# Patient Record
Sex: Female | Born: 1985 | Race: Black or African American | Hispanic: No | Marital: Single | Smoking: Former smoker
Health system: Southern US, Community
[De-identification: ages and names within clinical notes are randomized; demographics above are authoritative.]

## PROBLEM LIST (undated history)

## (undated) ENCOUNTER — Inpatient Hospital Stay (HOSPITAL_COMMUNITY): Payer: Self-pay

## (undated) DIAGNOSIS — L309 Dermatitis, unspecified: Secondary | ICD-10-CM

## (undated) DIAGNOSIS — O039 Complete or unspecified spontaneous abortion without complication: Secondary | ICD-10-CM

## (undated) DIAGNOSIS — A549 Gonococcal infection, unspecified: Secondary | ICD-10-CM

## (undated) DIAGNOSIS — T783XXA Angioneurotic edema, initial encounter: Secondary | ICD-10-CM

## (undated) DIAGNOSIS — IMO0001 Reserved for inherently not codable concepts without codable children: Secondary | ICD-10-CM

## (undated) DIAGNOSIS — F419 Anxiety disorder, unspecified: Secondary | ICD-10-CM

## (undated) DIAGNOSIS — J189 Pneumonia, unspecified organism: Secondary | ICD-10-CM

## (undated) DIAGNOSIS — B9689 Other specified bacterial agents as the cause of diseases classified elsewhere: Secondary | ICD-10-CM

## (undated) DIAGNOSIS — A599 Trichomoniasis, unspecified: Secondary | ICD-10-CM

## (undated) DIAGNOSIS — D649 Anemia, unspecified: Secondary | ICD-10-CM

## (undated) DIAGNOSIS — R51 Headache: Secondary | ICD-10-CM

## (undated) DIAGNOSIS — N760 Acute vaginitis: Secondary | ICD-10-CM

## (undated) DIAGNOSIS — A749 Chlamydial infection, unspecified: Secondary | ICD-10-CM

## (undated) DIAGNOSIS — F319 Bipolar disorder, unspecified: Secondary | ICD-10-CM

## (undated) DIAGNOSIS — B999 Unspecified infectious disease: Secondary | ICD-10-CM

## (undated) DIAGNOSIS — R87629 Unspecified abnormal cytological findings in specimens from vagina: Secondary | ICD-10-CM

## (undated) DIAGNOSIS — R519 Headache, unspecified: Secondary | ICD-10-CM

## (undated) HISTORY — DX: Unspecified abnormal cytological findings in specimens from vagina: R87.629

## (undated) HISTORY — PX: DILATION AND CURETTAGE OF UTERUS: SHX78

## (undated) HISTORY — DX: Headache: R51

## (undated) HISTORY — DX: Angioneurotic edema, initial encounter: T78.3XXA

## (undated) HISTORY — DX: Dermatitis, unspecified: L30.9

## (undated) HISTORY — DX: Pneumonia, unspecified organism: J18.9

## (undated) HISTORY — DX: Headache, unspecified: R51.9

---
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
## 2003-01-25 ENCOUNTER — Emergency Department (HOSPITAL_COMMUNITY): Admission: EM | Admit: 2003-01-25 | Discharge: 2003-01-25 | Payer: Self-pay | Admitting: Emergency Medicine

## 2004-02-21 ENCOUNTER — Emergency Department (HOSPITAL_COMMUNITY): Admission: EM | Admit: 2004-02-21 | Discharge: 2004-02-21 | Payer: Self-pay | Admitting: Emergency Medicine

## 2004-02-29 ENCOUNTER — Emergency Department (HOSPITAL_COMMUNITY): Admission: EM | Admit: 2004-02-29 | Discharge: 2004-02-29 | Payer: Self-pay | Admitting: Emergency Medicine

## 2004-05-16 ENCOUNTER — Emergency Department (HOSPITAL_COMMUNITY): Admission: EM | Admit: 2004-05-16 | Discharge: 2004-05-16 | Payer: Self-pay | Admitting: Family Medicine

## 2004-08-15 ENCOUNTER — Emergency Department (HOSPITAL_COMMUNITY): Admission: AD | Admit: 2004-08-15 | Discharge: 2004-08-15 | Payer: Self-pay | Admitting: Family Medicine

## 2005-01-20 ENCOUNTER — Emergency Department (HOSPITAL_COMMUNITY): Admission: EM | Admit: 2005-01-20 | Discharge: 2005-01-20 | Payer: Self-pay | Admitting: Family Medicine

## 2005-05-11 ENCOUNTER — Emergency Department (HOSPITAL_COMMUNITY): Admission: EM | Admit: 2005-05-11 | Discharge: 2005-05-11 | Payer: Self-pay | Admitting: Family Medicine

## 2005-11-02 ENCOUNTER — Emergency Department (HOSPITAL_COMMUNITY): Admission: EM | Admit: 2005-11-02 | Discharge: 2005-11-02 | Payer: Self-pay | Admitting: Family Medicine

## 2005-11-02 ENCOUNTER — Inpatient Hospital Stay (HOSPITAL_COMMUNITY): Admission: AD | Admit: 2005-11-02 | Discharge: 2005-11-03 | Payer: Self-pay | Admitting: Family Medicine

## 2005-11-20 ENCOUNTER — Emergency Department (HOSPITAL_COMMUNITY): Admission: EM | Admit: 2005-11-20 | Discharge: 2005-11-20 | Payer: Self-pay | Admitting: Family Medicine

## 2005-11-27 ENCOUNTER — Emergency Department (HOSPITAL_COMMUNITY): Admission: EM | Admit: 2005-11-27 | Discharge: 2005-11-27 | Payer: Self-pay | Admitting: Family Medicine

## 2006-03-10 ENCOUNTER — Emergency Department (HOSPITAL_COMMUNITY): Admission: EM | Admit: 2006-03-10 | Discharge: 2006-03-10 | Payer: Self-pay | Admitting: Emergency Medicine

## 2006-03-21 ENCOUNTER — Emergency Department (HOSPITAL_COMMUNITY): Admission: EM | Admit: 2006-03-21 | Discharge: 2006-03-21 | Payer: Self-pay | Admitting: Emergency Medicine

## 2006-03-27 ENCOUNTER — Emergency Department (HOSPITAL_COMMUNITY): Admission: EM | Admit: 2006-03-27 | Discharge: 2006-03-27 | Payer: Self-pay | Admitting: Emergency Medicine

## 2006-04-26 ENCOUNTER — Emergency Department (HOSPITAL_COMMUNITY): Admission: EM | Admit: 2006-04-26 | Discharge: 2006-04-26 | Payer: Self-pay | Admitting: Family Medicine

## 2006-05-04 ENCOUNTER — Emergency Department (HOSPITAL_COMMUNITY): Admission: EM | Admit: 2006-05-04 | Discharge: 2006-05-05 | Payer: Self-pay | Admitting: Emergency Medicine

## 2006-05-17 ENCOUNTER — Emergency Department (HOSPITAL_COMMUNITY): Admission: EM | Admit: 2006-05-17 | Discharge: 2006-05-17 | Payer: Self-pay | Admitting: Family Medicine

## 2006-05-21 ENCOUNTER — Emergency Department (HOSPITAL_COMMUNITY): Admission: EM | Admit: 2006-05-21 | Discharge: 2006-05-21 | Payer: Self-pay | Admitting: Emergency Medicine

## 2006-07-15 ENCOUNTER — Emergency Department (HOSPITAL_COMMUNITY): Admission: EM | Admit: 2006-07-15 | Discharge: 2006-07-15 | Payer: Self-pay | Admitting: Emergency Medicine

## 2006-07-18 ENCOUNTER — Emergency Department (HOSPITAL_COMMUNITY): Admission: EM | Admit: 2006-07-18 | Discharge: 2006-07-18 | Payer: Self-pay | Admitting: Emergency Medicine

## 2006-08-12 ENCOUNTER — Encounter (INDEPENDENT_AMBULATORY_CARE_PROVIDER_SITE_OTHER): Payer: Self-pay | Admitting: *Deleted

## 2006-09-07 ENCOUNTER — Emergency Department (HOSPITAL_COMMUNITY): Admission: EM | Admit: 2006-09-07 | Discharge: 2006-09-07 | Payer: Self-pay | Admitting: Family Medicine

## 2006-11-08 ENCOUNTER — Emergency Department (HOSPITAL_COMMUNITY): Admission: EM | Admit: 2006-11-08 | Discharge: 2006-11-08 | Payer: Self-pay | Admitting: Emergency Medicine

## 2006-11-10 ENCOUNTER — Emergency Department (HOSPITAL_COMMUNITY): Admission: EM | Admit: 2006-11-10 | Discharge: 2006-11-11 | Payer: Self-pay | Admitting: Emergency Medicine

## 2006-11-24 ENCOUNTER — Ambulatory Visit: Payer: Self-pay | Admitting: Internal Medicine

## 2006-12-01 ENCOUNTER — Encounter (INDEPENDENT_AMBULATORY_CARE_PROVIDER_SITE_OTHER): Payer: Self-pay | Admitting: Family Medicine

## 2006-12-01 ENCOUNTER — Ambulatory Visit: Payer: Self-pay | Admitting: Internal Medicine

## 2007-05-19 ENCOUNTER — Emergency Department (HOSPITAL_COMMUNITY): Admission: EM | Admit: 2007-05-19 | Discharge: 2007-05-19 | Payer: Self-pay | Admitting: Family Medicine

## 2007-06-22 ENCOUNTER — Ambulatory Visit: Payer: Self-pay | Admitting: Family Medicine

## 2007-07-11 ENCOUNTER — Emergency Department (HOSPITAL_COMMUNITY): Admission: EM | Admit: 2007-07-11 | Discharge: 2007-07-11 | Payer: Self-pay | Admitting: Family Medicine

## 2007-09-18 ENCOUNTER — Ambulatory Visit: Payer: Self-pay | Admitting: Internal Medicine

## 2007-09-27 ENCOUNTER — Ambulatory Visit: Payer: Self-pay | Admitting: *Deleted

## 2007-10-30 ENCOUNTER — Emergency Department (HOSPITAL_COMMUNITY): Admission: EM | Admit: 2007-10-30 | Discharge: 2007-10-30 | Payer: Self-pay | Admitting: Emergency Medicine

## 2007-12-18 ENCOUNTER — Emergency Department (HOSPITAL_COMMUNITY): Admission: EM | Admit: 2007-12-18 | Discharge: 2007-12-18 | Payer: Self-pay | Admitting: Emergency Medicine

## 2007-12-22 ENCOUNTER — Encounter: Payer: Self-pay | Admitting: Family Medicine

## 2007-12-22 ENCOUNTER — Ambulatory Visit: Payer: Self-pay | Admitting: Family Medicine

## 2007-12-22 LAB — CONVERTED CEMR LAB
Chlamydia, DNA Probe: NEGATIVE
GC Probe Amp, Genital: NEGATIVE

## 2008-05-01 IMAGING — CR DG CHEST 2V
2 series · 2 of 2 positions shown · non-contrast
Comparison: None.

CLINICAL DATA: Mid chest pain. Smoker.

CHEST - 2 VIEW  11/08/2006:

[w chest pa]
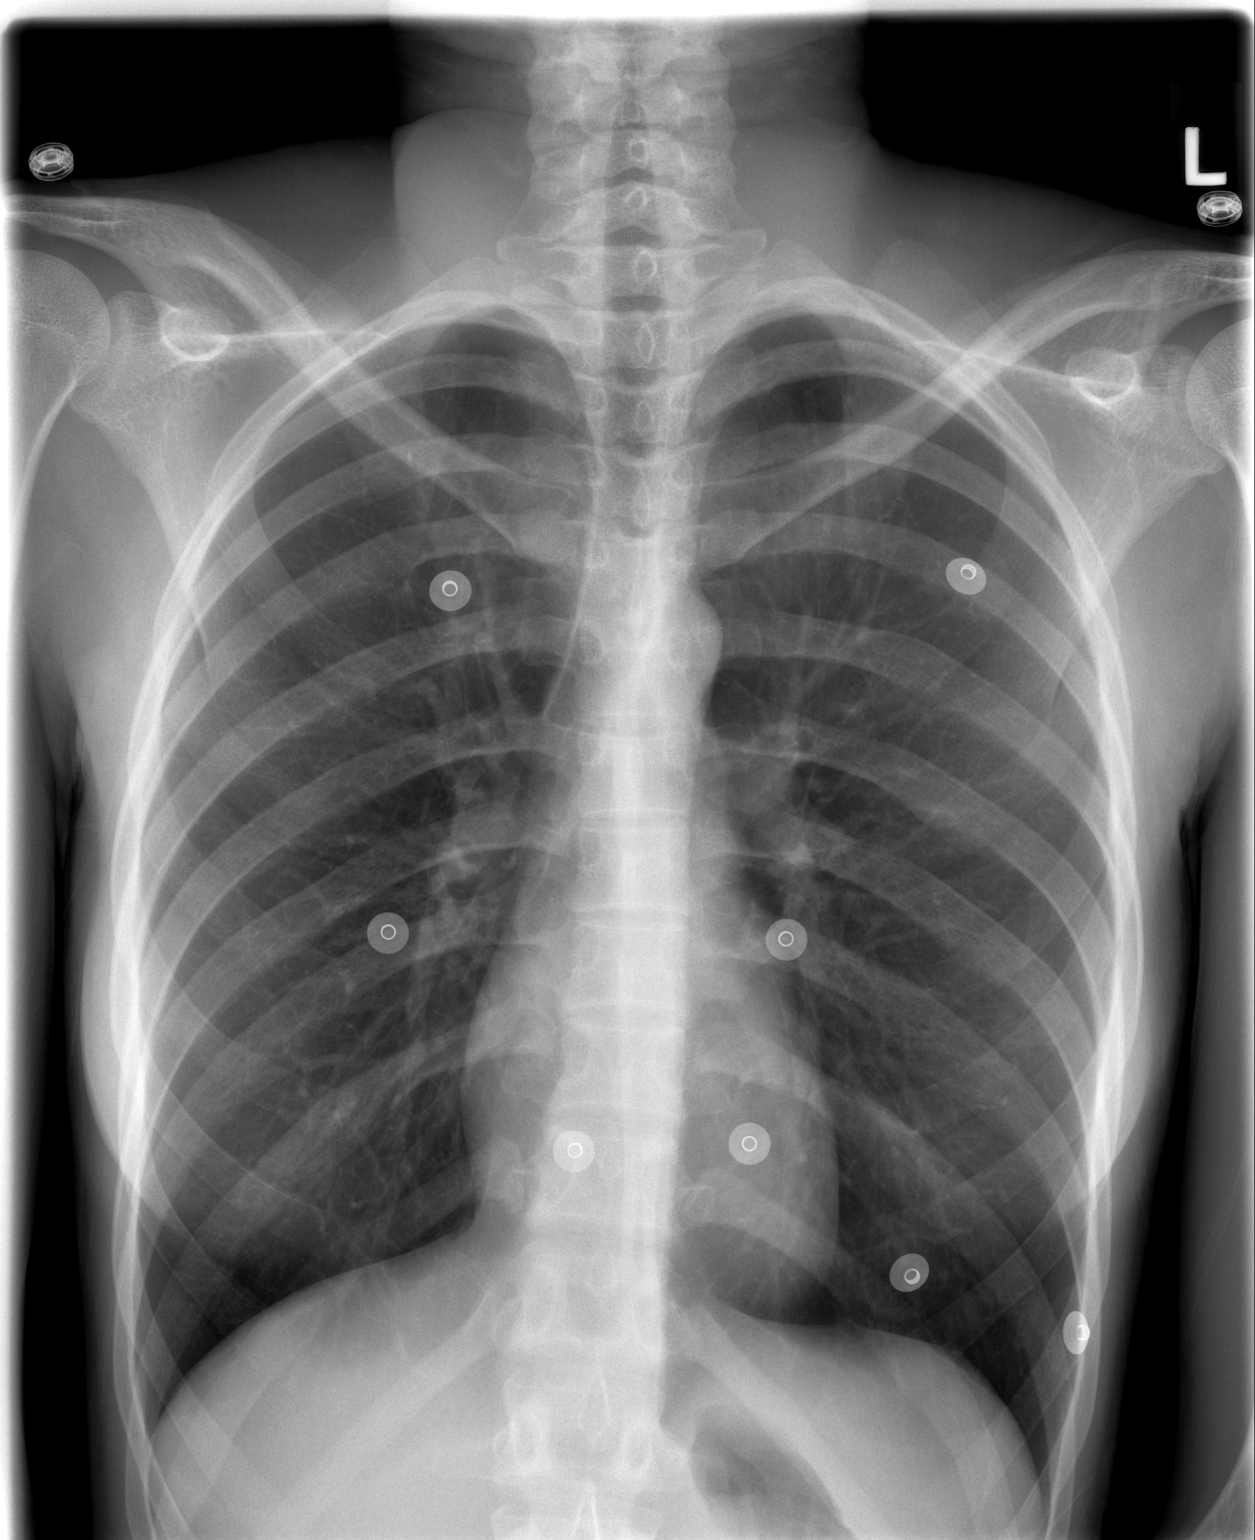

[w chest lat]
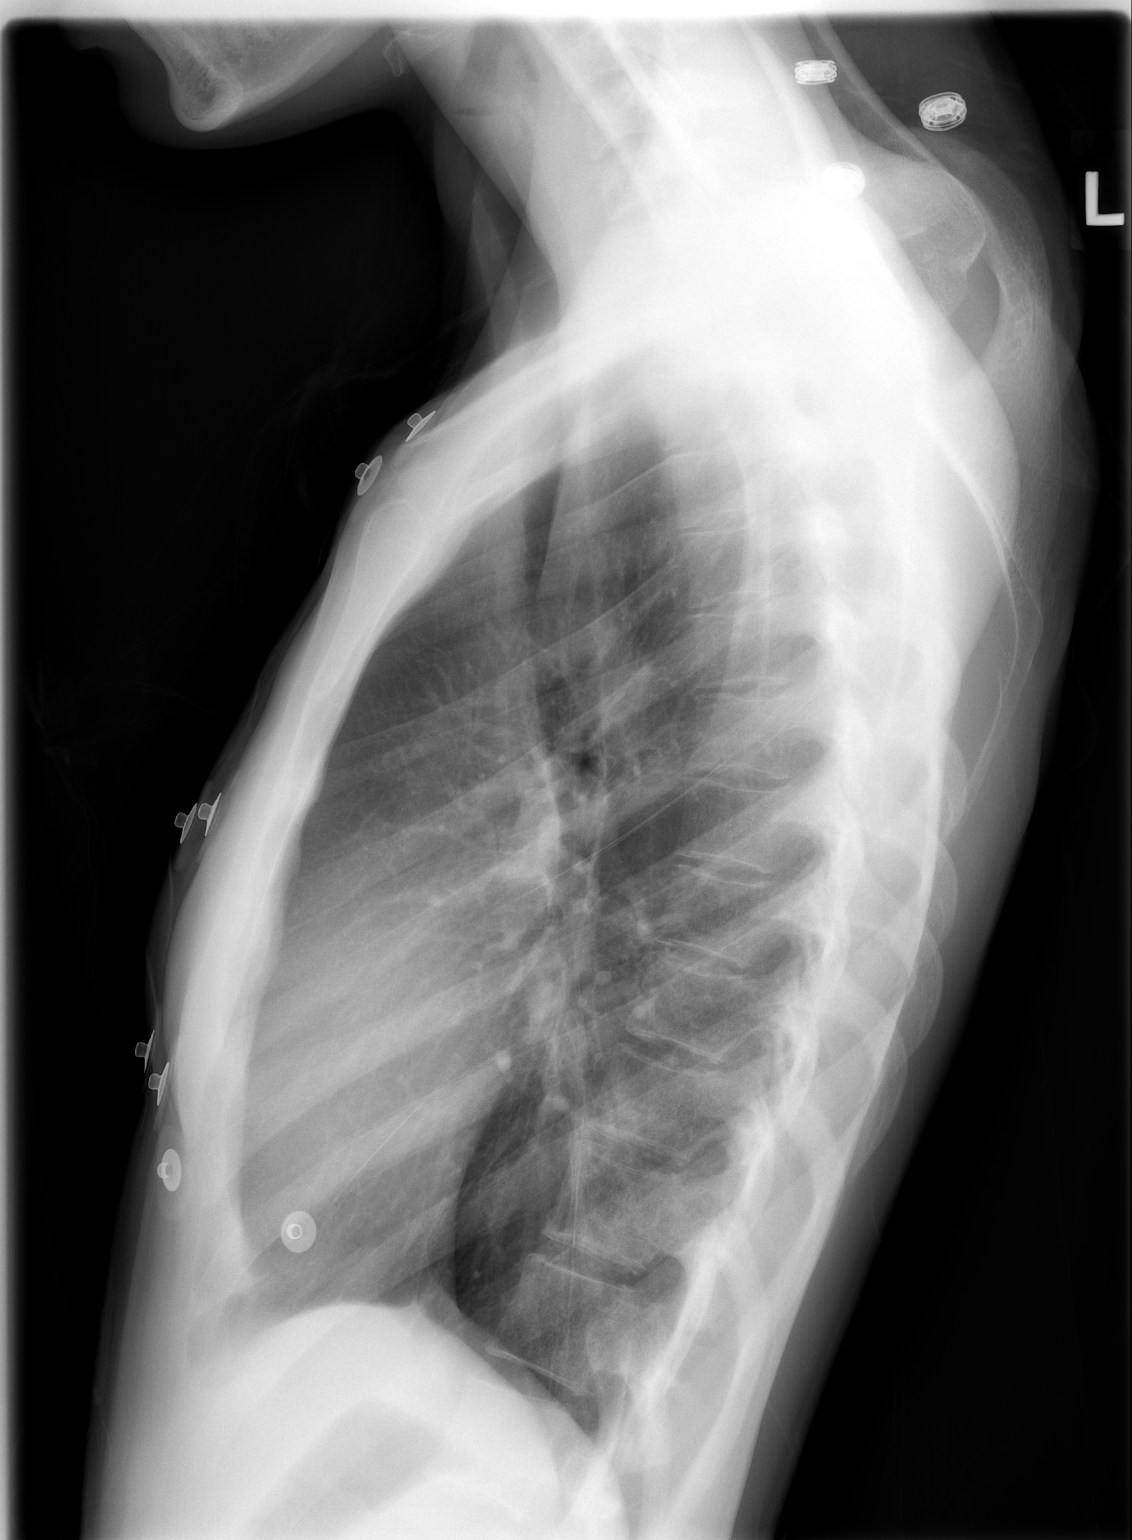

[2 of 2 positions shown; findings below may reference images not displayed]

FINDINGS: Cardiomediastinal silhouette unremarkable. Lungs hyperinflated but
clear. Bronchovascular markings normal. No pleural effusions. No pneumothorax.
Visualized bony thorax intact.
IMPRESSION: Hyperinflation. No acute cardiopulmonary disease otherwise.

## 2008-06-21 ENCOUNTER — Emergency Department (HOSPITAL_COMMUNITY): Admission: EM | Admit: 2008-06-21 | Discharge: 2008-06-21 | Payer: Self-pay | Admitting: Emergency Medicine

## 2008-09-05 ENCOUNTER — Emergency Department (HOSPITAL_COMMUNITY): Admission: EM | Admit: 2008-09-05 | Discharge: 2008-09-05 | Payer: Self-pay | Admitting: Emergency Medicine

## 2008-09-07 ENCOUNTER — Inpatient Hospital Stay (HOSPITAL_COMMUNITY): Admission: AD | Admit: 2008-09-07 | Discharge: 2008-09-07 | Payer: Self-pay | Admitting: Family Medicine

## 2008-09-18 ENCOUNTER — Ambulatory Visit (HOSPITAL_COMMUNITY): Admission: RE | Admit: 2008-09-18 | Discharge: 2008-09-18 | Payer: Self-pay | Admitting: Family Medicine

## 2008-09-30 ENCOUNTER — Inpatient Hospital Stay (HOSPITAL_COMMUNITY): Admission: AD | Admit: 2008-09-30 | Discharge: 2008-09-30 | Payer: Self-pay | Admitting: Family Medicine

## 2008-12-18 ENCOUNTER — Ambulatory Visit (HOSPITAL_COMMUNITY): Admission: RE | Admit: 2008-12-18 | Discharge: 2008-12-18 | Payer: Self-pay | Admitting: Obstetrics

## 2009-01-16 ENCOUNTER — Inpatient Hospital Stay (HOSPITAL_COMMUNITY): Admission: AD | Admit: 2009-01-16 | Discharge: 2009-01-16 | Payer: Self-pay | Admitting: Obstetrics

## 2009-01-18 ENCOUNTER — Inpatient Hospital Stay (HOSPITAL_COMMUNITY): Admission: AD | Admit: 2009-01-18 | Discharge: 2009-01-18 | Payer: Self-pay | Admitting: Obstetrics

## 2009-02-27 ENCOUNTER — Inpatient Hospital Stay (HOSPITAL_COMMUNITY): Admission: AD | Admit: 2009-02-27 | Discharge: 2009-02-27 | Payer: Self-pay | Admitting: Obstetrics

## 2009-03-04 ENCOUNTER — Inpatient Hospital Stay (HOSPITAL_COMMUNITY): Admission: AD | Admit: 2009-03-04 | Discharge: 2009-03-04 | Payer: Self-pay | Admitting: Obstetrics & Gynecology

## 2009-03-30 ENCOUNTER — Inpatient Hospital Stay (HOSPITAL_COMMUNITY): Admission: AD | Admit: 2009-03-30 | Discharge: 2009-04-04 | Payer: Self-pay | Admitting: Obstetrics

## 2009-04-02 ENCOUNTER — Encounter: Payer: Self-pay | Admitting: Obstetrics & Gynecology

## 2009-07-25 ENCOUNTER — Emergency Department (HOSPITAL_COMMUNITY): Admission: EM | Admit: 2009-07-25 | Discharge: 2009-07-25 | Payer: Self-pay | Admitting: Family Medicine

## 2009-08-13 ENCOUNTER — Emergency Department (HOSPITAL_COMMUNITY): Admission: EM | Admit: 2009-08-13 | Discharge: 2009-08-13 | Payer: Self-pay | Admitting: Emergency Medicine

## 2009-11-27 ENCOUNTER — Emergency Department (HOSPITAL_COMMUNITY): Admission: EM | Admit: 2009-11-27 | Discharge: 2009-11-28 | Payer: Self-pay | Admitting: Emergency Medicine

## 2010-01-17 ENCOUNTER — Emergency Department (HOSPITAL_COMMUNITY): Admission: EM | Admit: 2010-01-17 | Discharge: 2010-01-17 | Payer: Self-pay | Admitting: Family Medicine

## 2010-06-17 ENCOUNTER — Emergency Department (HOSPITAL_COMMUNITY)
Admission: EM | Admit: 2010-06-17 | Discharge: 2010-06-17 | Payer: Self-pay | Source: Home / Self Care | Admitting: Emergency Medicine

## 2010-06-17 LAB — CBC
HCT: 36.4 % (ref 36.0–46.0)
Hemoglobin: 11.8 g/dL — ABNORMAL LOW (ref 12.0–15.0)
MCH: 31.3 pg (ref 26.0–34.0)
MCHC: 32.4 g/dL (ref 30.0–36.0)
MCV: 96.6 fL (ref 78.0–100.0)
Platelets: 181 10*3/uL (ref 150–400)
RBC: 3.77 MIL/uL — ABNORMAL LOW (ref 3.87–5.11)
RDW: 12.1 % (ref 11.5–15.5)
WBC: 3.3 10*3/uL — ABNORMAL LOW (ref 4.0–10.5)

## 2010-06-17 LAB — URINE MICROSCOPIC-ADD ON

## 2010-06-17 LAB — URINALYSIS, ROUTINE W REFLEX MICROSCOPIC
Bilirubin Urine: NEGATIVE
Ketones, ur: NEGATIVE mg/dL
Leukocytes, UA: NEGATIVE
Nitrite: NEGATIVE
Protein, ur: NEGATIVE mg/dL
Specific Gravity, Urine: 1.031 — ABNORMAL HIGH (ref 1.005–1.030)
Urine Glucose, Fasting: NEGATIVE mg/dL
Urobilinogen, UA: 0.2 mg/dL (ref 0.0–1.0)
pH: 5.5 (ref 5.0–8.0)

## 2010-06-17 LAB — POCT PREGNANCY, URINE: Preg Test, Ur: NEGATIVE

## 2010-06-17 LAB — WET PREP, GENITAL
Trich, Wet Prep: NONE SEEN
Yeast Wet Prep HPF POC: NONE SEEN

## 2010-06-18 LAB — GC/CHLAMYDIA PROBE AMP, GENITAL
Chlamydia, DNA Probe: NEGATIVE
GC Probe Amp, Genital: NEGATIVE

## 2010-07-05 ENCOUNTER — Encounter: Payer: Self-pay | Admitting: Family Medicine

## 2010-08-28 LAB — POCT I-STAT, CHEM 8
BUN: 15 mg/dL (ref 6–23)
Calcium, Ion: 1.11 mmol/L — ABNORMAL LOW (ref 1.12–1.32)
Chloride: 107 mEq/L (ref 96–112)
Creatinine, Ser: 0.9 mg/dL (ref 0.4–1.2)
Glucose, Bld: 91 mg/dL (ref 70–99)
HCT: 46 % (ref 36.0–46.0)
Hemoglobin: 15.6 g/dL — ABNORMAL HIGH (ref 12.0–15.0)
Potassium: 3.4 mEq/L — ABNORMAL LOW (ref 3.5–5.1)
Sodium: 138 mEq/L (ref 135–145)
TCO2: 22 mmol/L (ref 0–100)

## 2010-08-28 LAB — POCT URINALYSIS DIPSTICK
Bilirubin Urine: NEGATIVE
Glucose, UA: NEGATIVE mg/dL
Ketones, ur: NEGATIVE mg/dL
Nitrite: NEGATIVE
Protein, ur: NEGATIVE mg/dL
Specific Gravity, Urine: 1.025 (ref 1.005–1.030)
Urobilinogen, UA: 0.2 mg/dL (ref 0.0–1.0)
pH: 6.5 (ref 5.0–8.0)

## 2010-08-28 LAB — GC/CHLAMYDIA PROBE AMP, GENITAL
Chlamydia, DNA Probe: NEGATIVE
GC Probe Amp, Genital: NEGATIVE

## 2010-08-28 LAB — POCT PREGNANCY, URINE: Preg Test, Ur: NEGATIVE

## 2010-08-30 LAB — MONONUCLEOSIS SCREEN: Mono Screen: NEGATIVE

## 2010-08-30 LAB — RAPID STREP SCREEN (MED CTR MEBANE ONLY): Streptococcus, Group A Screen (Direct): NEGATIVE

## 2010-09-04 LAB — URINE CULTURE: Colony Count: 75000

## 2010-09-04 LAB — URINALYSIS, ROUTINE W REFLEX MICROSCOPIC
Bilirubin Urine: NEGATIVE
Glucose, UA: NEGATIVE mg/dL
Hgb urine dipstick: NEGATIVE
Ketones, ur: NEGATIVE mg/dL
Nitrite: NEGATIVE
Protein, ur: NEGATIVE mg/dL
Specific Gravity, Urine: 1.024 (ref 1.005–1.030)
Urobilinogen, UA: 0.2 mg/dL (ref 0.0–1.0)
pH: 6 (ref 5.0–8.0)

## 2010-09-04 LAB — URINE MICROSCOPIC-ADD ON

## 2010-09-04 LAB — WET PREP, GENITAL: Trich, Wet Prep: NONE SEEN

## 2010-09-04 LAB — GC/CHLAMYDIA PROBE AMP, GENITAL
Chlamydia, DNA Probe: NEGATIVE
GC Probe Amp, Genital: NEGATIVE

## 2010-09-04 LAB — PREGNANCY, URINE: Preg Test, Ur: NEGATIVE

## 2010-09-17 LAB — CBC
HCT: 25.5 % — ABNORMAL LOW (ref 36.0–46.0)
HCT: 29 % — ABNORMAL LOW (ref 36.0–46.0)
HCT: 30.4 % — ABNORMAL LOW (ref 36.0–46.0)
Hemoglobin: 10.2 g/dL — ABNORMAL LOW (ref 12.0–15.0)
Hemoglobin: 8.5 g/dL — ABNORMAL LOW (ref 12.0–15.0)
Hemoglobin: 9.6 g/dL — ABNORMAL LOW (ref 12.0–15.0)
MCHC: 33.1 g/dL (ref 30.0–36.0)
MCHC: 33.4 g/dL (ref 30.0–36.0)
MCHC: 33.7 g/dL (ref 30.0–36.0)
MCV: 98.2 fL (ref 78.0–100.0)
MCV: 98.3 fL (ref 78.0–100.0)
MCV: 98.6 fL (ref 78.0–100.0)
Platelets: 200 10*3/uL (ref 150–400)
Platelets: 232 10*3/uL (ref 150–400)
Platelets: 238 10*3/uL (ref 150–400)
RBC: 2.59 MIL/uL — ABNORMAL LOW (ref 3.87–5.11)
RBC: 2.94 MIL/uL — ABNORMAL LOW (ref 3.87–5.11)
RBC: 3.1 MIL/uL — ABNORMAL LOW (ref 3.87–5.11)
RDW: 13.2 % (ref 11.5–15.5)
RDW: 13.4 % (ref 11.5–15.5)
RDW: 13.6 % (ref 11.5–15.5)
WBC: 12.2 10*3/uL — ABNORMAL HIGH (ref 4.0–10.5)
WBC: 14 10*3/uL — ABNORMAL HIGH (ref 4.0–10.5)
WBC: 9.8 10*3/uL (ref 4.0–10.5)

## 2010-09-17 LAB — COMPREHENSIVE METABOLIC PANEL
ALT: 17 U/L (ref 0–35)
ALT: 40 U/L — ABNORMAL HIGH (ref 0–35)
ALT: 50 U/L — ABNORMAL HIGH (ref 0–35)
AST: 26 U/L (ref 0–37)
AST: 44 U/L — ABNORMAL HIGH (ref 0–37)
AST: 45 U/L — ABNORMAL HIGH (ref 0–37)
Albumin: 1.8 g/dL — ABNORMAL LOW (ref 3.5–5.2)
Albumin: 2.1 g/dL — ABNORMAL LOW (ref 3.5–5.2)
Albumin: 2.5 g/dL — ABNORMAL LOW (ref 3.5–5.2)
Alkaline Phosphatase: 102 U/L (ref 39–117)
Alkaline Phosphatase: 126 U/L — ABNORMAL HIGH (ref 39–117)
Alkaline Phosphatase: 97 U/L (ref 39–117)
BUN: 2 mg/dL — ABNORMAL LOW (ref 6–23)
BUN: 4 mg/dL — ABNORMAL LOW (ref 6–23)
BUN: 5 mg/dL — ABNORMAL LOW (ref 6–23)
CO2: 24 mEq/L (ref 19–32)
CO2: 26 mEq/L (ref 19–32)
CO2: 29 mEq/L (ref 19–32)
Calcium: 6.7 mg/dL — ABNORMAL LOW (ref 8.4–10.5)
Calcium: 8.1 mg/dL — ABNORMAL LOW (ref 8.4–10.5)
Calcium: 8.7 mg/dL (ref 8.4–10.5)
Chloride: 104 mEq/L (ref 96–112)
Chloride: 105 mEq/L (ref 96–112)
Chloride: 98 mEq/L (ref 96–112)
Creatinine, Ser: 0.67 mg/dL (ref 0.4–1.2)
Creatinine, Ser: 0.67 mg/dL (ref 0.4–1.2)
Creatinine, Ser: 0.73 mg/dL (ref 0.4–1.2)
GFR calc Af Amer: >60 mL/min (ref >60)
GFR calc Af Amer: >60 mL/min (ref >60)
GFR calc Af Amer: >60 mL/min (ref >60)
GFR calc non Af Amer: >60 mL/min (ref >60)
GFR calc non Af Amer: >60 mL/min (ref >60)
GFR calc non Af Amer: >60 mL/min (ref >60)
Glucose, Bld: 107 mg/dL — ABNORMAL HIGH (ref 70–99)
Glucose, Bld: 134 mg/dL — ABNORMAL HIGH (ref 70–99)
Glucose, Bld: 63 mg/dL — ABNORMAL LOW (ref 70–99)
Potassium: 3.1 mEq/L — ABNORMAL LOW (ref 3.5–5.1)
Potassium: 3.3 mEq/L — ABNORMAL LOW (ref 3.5–5.1)
Potassium: 3.8 mEq/L (ref 3.5–5.1)
Sodium: 131 mEq/L — ABNORMAL LOW (ref 135–145)
Sodium: 138 mEq/L (ref 135–145)
Sodium: 138 mEq/L (ref 135–145)
Total Bilirubin: 0.4 mg/dL (ref 0.3–1.2)
Total Bilirubin: 0.4 mg/dL (ref 0.3–1.2)
Total Bilirubin: 0.5 mg/dL (ref 0.3–1.2)
Total Protein: 4.4 g/dL — ABNORMAL LOW (ref 6.0–8.3)
Total Protein: 5 g/dL — ABNORMAL LOW (ref 6.0–8.3)
Total Protein: 5.8 g/dL — ABNORMAL LOW (ref 6.0–8.3)

## 2010-09-17 LAB — DIC (DISSEMINATED INTRAVASCULAR COAGULATION)PANEL
INR: 1.13 (ref 0.00–1.49)
Prothrombin Time: 14.4 seconds (ref 11.6–15.2)
aPTT: 39 seconds — ABNORMAL HIGH (ref 24–37)

## 2010-09-17 LAB — RPR: RPR Ser Ql: NONREACTIVE

## 2010-09-17 LAB — TYPE AND SCREEN
ABO/RH(D): O POS
Antibody Screen: NEGATIVE

## 2010-09-17 LAB — URINALYSIS, DIPSTICK ONLY
Bilirubin Urine: NEGATIVE
Glucose, UA: NEGATIVE mg/dL
Ketones, ur: NEGATIVE mg/dL
Nitrite: NEGATIVE
Protein, ur: NEGATIVE mg/dL
Specific Gravity, Urine: <1.005 — ABNORMAL LOW (ref 1.005–1.030)
Urobilinogen, UA: 0.2 mg/dL (ref 0.0–1.0)
pH: 5.5 (ref 5.0–8.0)

## 2010-09-17 LAB — DIC (DISSEMINATED INTRAVASCULAR COAGULATION) PANEL (NOT AT ARMC)
D-Dimer, Quant: 3.45 ug/mL-FEU — ABNORMAL HIGH (ref 0.00–0.48)
Fibrinogen: 629 mg/dL — ABNORMAL HIGH (ref 204–475)
Platelets: 207 10*3/uL (ref 150–400)
Smear Review: NONE SEEN

## 2010-09-17 LAB — CREATININE CLEARANCE, URINE, 24 HOUR
Collection Interval-CRCL: 24 hours
Creatinine Clearance: 140 mL/min — ABNORMAL HIGH (ref 75–115)
Creatinine, 24H Ur: 1475 mg/d (ref 700–1800)
Creatinine, Urine: 23.7 mg/dL
Creatinine: 0.73 mg/dL (ref 0.40–1.20)
Urine Total Volume-CRCL: 6225 mL

## 2010-09-17 LAB — URINE CULTURE
Colony Count: NO GROWTH
Culture: NO GROWTH
Special Requests: NEGATIVE

## 2010-09-17 LAB — PROTEIN, URINE, 24 HOUR
Collection Interval-UPROT: 24 hours
Protein, 24H Urine: 249 mg/d — ABNORMAL HIGH (ref 50–100)
Protein, Urine: 4 mg/dL
Urine Total Volume-UPROT: 6225 mL

## 2010-09-17 LAB — MAGNESIUM: Magnesium: 5.5 mg/dL — ABNORMAL HIGH (ref 1.5–2.5)

## 2010-09-17 LAB — GC/CHLAMYDIA PROBE AMP, URINE
Chlamydia, Swab/Urine, PCR: NEGATIVE
GC Probe Amp, Urine: NEGATIVE

## 2010-09-17 LAB — LACTATE DEHYDROGENASE
LDH: 158 U/L (ref 94–250)
LDH: 223 U/L (ref 94–250)

## 2010-09-17 LAB — URIC ACID: Uric Acid, Serum: 4.1 mg/dL (ref 2.4–7.0)

## 2010-09-18 LAB — WET PREP, GENITAL
Clue Cells Wet Prep HPF POC: NONE SEEN
Trich, Wet Prep: NONE SEEN
Yeast Wet Prep HPF POC: NONE SEEN

## 2010-09-18 LAB — GC/CHLAMYDIA PROBE AMP, GENITAL
Chlamydia, DNA Probe: NEGATIVE
GC Probe Amp, Genital: NEGATIVE

## 2010-09-19 LAB — URINALYSIS, ROUTINE W REFLEX MICROSCOPIC
Bilirubin Urine: NEGATIVE
Glucose, UA: NEGATIVE mg/dL
Hgb urine dipstick: NEGATIVE
Ketones, ur: NEGATIVE mg/dL
Nitrite: NEGATIVE
Protein, ur: NEGATIVE mg/dL
Specific Gravity, Urine: 1.01 (ref 1.005–1.030)
Urobilinogen, UA: 0.2 mg/dL (ref 0.0–1.0)
pH: 6 (ref 5.0–8.0)

## 2010-09-19 LAB — URINE MICROSCOPIC-ADD ON: RBC / HPF: NONE SEEN RBC/hpf (ref <3)

## 2010-09-19 LAB — WET PREP, GENITAL
Clue Cells Wet Prep HPF POC: NONE SEEN
Trich, Wet Prep: NONE SEEN

## 2010-09-19 LAB — CBC
HCT: 30.7 % — ABNORMAL LOW (ref 36.0–46.0)
Hemoglobin: 10.5 g/dL — ABNORMAL LOW (ref 12.0–15.0)
MCHC: 34.2 g/dL (ref 30.0–36.0)
MCV: 99.4 fL (ref 78.0–100.0)
Platelets: 189 10*3/uL (ref 150–400)
RBC: 3.09 MIL/uL — ABNORMAL LOW (ref 3.87–5.11)
RDW: 13.4 % (ref 11.5–15.5)
WBC: 6.6 10*3/uL (ref 4.0–10.5)

## 2010-09-19 LAB — GC/CHLAMYDIA PROBE AMP, GENITAL
Chlamydia, DNA Probe: POSITIVE — AB
GC Probe Amp, Genital: NEGATIVE

## 2010-09-19 LAB — FETAL FIBRONECTIN: Fetal Fibronectin: POSITIVE

## 2010-09-21 ENCOUNTER — Inpatient Hospital Stay (INDEPENDENT_AMBULATORY_CARE_PROVIDER_SITE_OTHER)
Admission: RE | Admit: 2010-09-21 | Discharge: 2010-09-21 | Disposition: A | Discharge status: Home / Self Care | Payer: Self-pay | Source: Ambulatory Visit | Attending: Emergency Medicine | Admitting: Emergency Medicine

## 2010-09-21 DIAGNOSIS — A499 Bacterial infection, unspecified: Secondary | ICD-10-CM

## 2010-09-21 DIAGNOSIS — N760 Acute vaginitis: Secondary | ICD-10-CM

## 2010-09-21 LAB — WET PREP, GENITAL
Trich, Wet Prep: NONE SEEN
Yeast Wet Prep HPF POC: NONE SEEN

## 2010-09-21 LAB — POCT PREGNANCY, URINE: Preg Test, Ur: NEGATIVE

## 2010-09-22 LAB — GC/CHLAMYDIA PROBE AMP, GENITAL
Chlamydia, DNA Probe: NEGATIVE
GC Probe Amp, Genital: NEGATIVE

## 2010-09-23 LAB — WET PREP, GENITAL
Clue Cells Wet Prep HPF POC: NONE SEEN
Trich, Wet Prep: NONE SEEN
Yeast Wet Prep HPF POC: NONE SEEN

## 2010-09-23 LAB — URINALYSIS, ROUTINE W REFLEX MICROSCOPIC
Bilirubin Urine: NEGATIVE
Glucose, UA: NEGATIVE mg/dL
Hgb urine dipstick: NEGATIVE
Ketones, ur: NEGATIVE mg/dL
Nitrite: NEGATIVE
Protein, ur: NEGATIVE mg/dL
Specific Gravity, Urine: 1.025 (ref 1.005–1.030)
Urobilinogen, UA: 0.2 mg/dL (ref 0.0–1.0)
pH: 6 (ref 5.0–8.0)

## 2010-09-23 LAB — CBC
HCT: 33.7 % — ABNORMAL LOW (ref 36.0–46.0)
Hemoglobin: 11.7 g/dL — ABNORMAL LOW (ref 12.0–15.0)
MCHC: 34.7 g/dL (ref 30.0–36.0)
MCV: 97.7 fL (ref 78.0–100.0)
Platelets: 183 10*3/uL (ref 150–400)
RBC: 3.45 MIL/uL — ABNORMAL LOW (ref 3.87–5.11)
RDW: 13.1 % (ref 11.5–15.5)
WBC: 5.9 10*3/uL (ref 4.0–10.5)

## 2010-09-23 LAB — DIFFERENTIAL
Basophils Absolute: 0 10*3/uL (ref 0.0–0.1)
Basophils Relative: 0 % (ref 0–1)
Eosinophils Absolute: 0.1 10*3/uL (ref 0.0–0.7)
Eosinophils Relative: 1 % (ref 0–5)
Lymphocytes Relative: 30 % (ref 12–46)
Lymphs Abs: 1.7 10*3/uL (ref 0.7–4.0)
Monocytes Absolute: 0.4 10*3/uL (ref 0.1–1.0)
Monocytes Relative: 7 % (ref 3–12)
Neutro Abs: 3.6 10*3/uL (ref 1.7–7.7)
Neutrophils Relative %: 62 % (ref 43–77)

## 2010-09-24 LAB — URINALYSIS, ROUTINE W REFLEX MICROSCOPIC
Bilirubin Urine: NEGATIVE
Glucose, UA: NEGATIVE mg/dL
Hgb urine dipstick: NEGATIVE
Ketones, ur: NEGATIVE mg/dL
Nitrite: NEGATIVE
Protein, ur: NEGATIVE mg/dL
Specific Gravity, Urine: 1.021 (ref 1.005–1.030)
Urobilinogen, UA: 0.2 mg/dL (ref 0.0–1.0)
pH: 6 (ref 5.0–8.0)

## 2010-09-24 LAB — HCG, QUANTITATIVE, PREGNANCY
hCG, Beta Chain, Quant, S: 2130 m[IU]/mL — ABNORMAL HIGH (ref <5)
hCG, Beta Chain, Quant, S: 4173 m[IU]/mL — ABNORMAL HIGH (ref <5)

## 2010-09-24 LAB — WET PREP, GENITAL
Trich, Wet Prep: NONE SEEN
Yeast Wet Prep HPF POC: NONE SEEN

## 2010-09-24 LAB — RPR: RPR Ser Ql: NONREACTIVE

## 2010-09-24 LAB — POCT PREGNANCY, URINE: Preg Test, Ur: POSITIVE

## 2010-09-24 LAB — GC/CHLAMYDIA PROBE AMP, GENITAL
Chlamydia, DNA Probe: NEGATIVE
GC Probe Amp, Genital: NEGATIVE

## 2010-09-28 LAB — WET PREP, GENITAL
Clue Cells Wet Prep HPF POC: NONE SEEN
Yeast Wet Prep HPF POC: NONE SEEN

## 2010-09-28 LAB — POCT I-STAT, CHEM 8
BUN: 16 mg/dL (ref 6–23)
Calcium, Ion: 1.25 mmol/L (ref 1.12–1.32)
Chloride: 103 mEq/L (ref 96–112)
Creatinine, Ser: 1 mg/dL (ref 0.4–1.2)
Glucose, Bld: 111 mg/dL — ABNORMAL HIGH (ref 70–99)
HCT: 43 % (ref 36.0–46.0)
Hemoglobin: 14.6 g/dL (ref 12.0–15.0)
Potassium: 3.3 mEq/L — ABNORMAL LOW (ref 3.5–5.1)
Sodium: 141 mEq/L (ref 135–145)
TCO2: 28 mmol/L (ref 0–100)

## 2010-09-28 LAB — URINALYSIS, ROUTINE W REFLEX MICROSCOPIC
Bilirubin Urine: NEGATIVE
Glucose, UA: NEGATIVE mg/dL
Hgb urine dipstick: NEGATIVE
Ketones, ur: NEGATIVE mg/dL
Nitrite: NEGATIVE
Protein, ur: NEGATIVE mg/dL
Specific Gravity, Urine: 1.026 (ref 1.005–1.030)
Urobilinogen, UA: 1 mg/dL (ref 0.0–1.0)
pH: 6 (ref 5.0–8.0)

## 2010-09-28 LAB — URINE MICROSCOPIC-ADD ON

## 2010-09-28 LAB — GC/CHLAMYDIA PROBE AMP, GENITAL
Chlamydia, DNA Probe: NEGATIVE
GC Probe Amp, Genital: NEGATIVE

## 2010-09-28 LAB — POCT PREGNANCY, URINE: Preg Test, Ur: NEGATIVE

## 2010-09-28 LAB — RPR: RPR Ser Ql: NONREACTIVE

## 2011-02-14 ENCOUNTER — Emergency Department (HOSPITAL_COMMUNITY): Payer: No Typology Code available for payment source

## 2011-02-14 ENCOUNTER — Emergency Department (HOSPITAL_COMMUNITY)
Admission: EM | Admit: 2011-02-14 | Discharge: 2011-02-15 | Disposition: A | Discharge status: Home / Self Care | Payer: Medicaid Other | Attending: Emergency Medicine | Admitting: Emergency Medicine

## 2011-02-14 DIAGNOSIS — Y92410 Unspecified street and highway as the place of occurrence of the external cause: Secondary | ICD-10-CM | POA: Insufficient documentation

## 2011-02-14 DIAGNOSIS — S93609A Unspecified sprain of unspecified foot, initial encounter: Secondary | ICD-10-CM | POA: Insufficient documentation

## 2011-02-26 ENCOUNTER — Emergency Department (HOSPITAL_COMMUNITY)
Admission: EM | Admit: 2011-02-26 | Discharge: 2011-02-26 | Disposition: A | Discharge status: Home / Self Care | Payer: No Typology Code available for payment source | Attending: Emergency Medicine | Admitting: Emergency Medicine

## 2011-02-26 DIAGNOSIS — R51 Headache: Secondary | ICD-10-CM | POA: Insufficient documentation

## 2011-03-04 LAB — WET PREP, GENITAL: Yeast Wet Prep HPF POC: NONE SEEN

## 2011-03-04 LAB — POCT URINALYSIS DIP (DEVICE)
Glucose, UA: NEGATIVE
Hgb urine dipstick: NEGATIVE
Nitrite: NEGATIVE
Operator id: 235561
Protein, ur: 30 — AB
Specific Gravity, Urine: >=1.03
Urobilinogen, UA: 0.2
pH: 5.5

## 2011-03-04 LAB — GC/CHLAMYDIA PROBE AMP, GENITAL
Chlamydia, DNA Probe: NEGATIVE
GC Probe Amp, Genital: NEGATIVE

## 2011-03-04 LAB — POCT PREGNANCY, URINE
Operator id: 235561
Preg Test, Ur: NEGATIVE

## 2011-03-10 ENCOUNTER — Inpatient Hospital Stay (INDEPENDENT_AMBULATORY_CARE_PROVIDER_SITE_OTHER)
Admission: RE | Admit: 2011-03-10 | Discharge: 2011-03-10 | Disposition: A | Discharge status: Home / Self Care | Payer: Self-pay | Source: Ambulatory Visit | Attending: Family Medicine | Admitting: Family Medicine

## 2011-03-10 DIAGNOSIS — Z331 Pregnant state, incidental: Secondary | ICD-10-CM

## 2011-03-10 DIAGNOSIS — N760 Acute vaginitis: Secondary | ICD-10-CM

## 2011-03-10 LAB — POCT URINALYSIS DIP (DEVICE)
Bilirubin Urine: NEGATIVE
Glucose, UA: NEGATIVE mg/dL
Hgb urine dipstick: NEGATIVE
Ketones, ur: NEGATIVE mg/dL
Leukocytes, UA: NEGATIVE
Nitrite: NEGATIVE
Protein, ur: NEGATIVE mg/dL
Specific Gravity, Urine: 1.02 (ref 1.005–1.030)
Urobilinogen, UA: 1 mg/dL (ref 0.0–1.0)
pH: 7 (ref 5.0–8.0)

## 2011-03-10 LAB — WET PREP, GENITAL
Trich, Wet Prep: NONE SEEN
Yeast Wet Prep HPF POC: NONE SEEN

## 2011-03-10 LAB — POCT PREGNANCY, URINE: Preg Test, Ur: POSITIVE

## 2011-03-11 LAB — GC/CHLAMYDIA PROBE AMP, GENITAL
Chlamydia, DNA Probe: NEGATIVE
GC Probe Amp, Genital: NEGATIVE

## 2011-03-11 LAB — POCT URINALYSIS DIP (DEVICE)
Bilirubin Urine: NEGATIVE
Glucose, UA: NEGATIVE
Hgb urine dipstick: NEGATIVE
Ketones, ur: NEGATIVE
Nitrite: NEGATIVE
Operator id: 235561
Protein, ur: NEGATIVE
Specific Gravity, Urine: 1.025
Urobilinogen, UA: 0.2
pH: 6

## 2011-03-11 LAB — POCT PREGNANCY, URINE
Operator id: 235561
Preg Test, Ur: NEGATIVE

## 2011-03-14 ENCOUNTER — Inpatient Hospital Stay (HOSPITAL_COMMUNITY)
Admission: AD | Admit: 2011-03-14 | Discharge: 2011-03-14 | Disposition: A | Discharge status: Home / Self Care | Payer: Medicaid Other | Source: Ambulatory Visit | Attending: Obstetrics & Gynecology | Admitting: Obstetrics & Gynecology

## 2011-03-14 ENCOUNTER — Encounter (HOSPITAL_COMMUNITY): Payer: Self-pay | Admitting: *Deleted

## 2011-03-14 DIAGNOSIS — O2390 Unspecified genitourinary tract infection in pregnancy, unspecified trimester: Secondary | ICD-10-CM | POA: Insufficient documentation

## 2011-03-14 DIAGNOSIS — N760 Acute vaginitis: Secondary | ICD-10-CM

## 2011-03-14 DIAGNOSIS — B9689 Other specified bacterial agents as the cause of diseases classified elsewhere: Secondary | ICD-10-CM | POA: Insufficient documentation

## 2011-03-14 DIAGNOSIS — A499 Bacterial infection, unspecified: Secondary | ICD-10-CM

## 2011-03-14 HISTORY — DX: Gonococcal infection, unspecified: A54.9

## 2011-03-14 HISTORY — DX: Trichomoniasis, unspecified: A59.9

## 2011-03-14 HISTORY — DX: Chlamydial infection, unspecified: A74.9

## 2011-03-14 HISTORY — DX: Other specified bacterial agents as the cause of diseases classified elsewhere: B96.89

## 2011-03-14 HISTORY — DX: Other specified bacterial agents as the cause of diseases classified elsewhere: N76.0

## 2011-03-14 HISTORY — DX: Anxiety disorder, unspecified: F41.9

## 2011-03-14 LAB — URINALYSIS, ROUTINE W REFLEX MICROSCOPIC
Bilirubin Urine: NEGATIVE
Glucose, UA: NEGATIVE mg/dL
Hgb urine dipstick: NEGATIVE
Ketones, ur: NEGATIVE mg/dL
Leukocytes, UA: NEGATIVE
Nitrite: NEGATIVE
Protein, ur: NEGATIVE mg/dL
Specific Gravity, Urine: >1.03 — ABNORMAL HIGH (ref 1.005–1.030)
Urobilinogen, UA: 0.2 mg/dL (ref 0.0–1.0)
pH: 6 (ref 5.0–8.0)

## 2011-03-14 LAB — POCT PREGNANCY, URINE: Preg Test, Ur: POSITIVE

## 2011-03-14 MED ORDER — METRONIDAZOLE 500 MG PO TABS: 500.0000 mg | ORAL_TABLET | Freq: Two times a day (BID) | ORAL | Status: AC

## 2011-03-14 MED ORDER — TERCONAZOLE 0.4 % VA CREA: 1.0000 | TOPICAL_CREAM | Freq: Every day | VAGINAL | Status: AC

## 2011-03-14 NOTE — Progress Notes (Signed)
Was seen few days ago, given Rx for metronidazole gel for BV, did not get Rx filled, can't afford the gel.  CNM notified.  States appetite is decreased, but is eating more frequently and smaller meals.  Discussed with that is acceptable and to make good choices.

## 2011-03-14 NOTE — ED Provider Notes (Signed)
Chief Complaint:  Headache   Courtney Adams is  25 y.o. G1P0.  Patient's last menstrual period was 01/29/2011.[name]  Her pregnancy status is positive.  She presents complaining of Headache and ? Dysuria. She was eval on 9/26 and had a +UPT as well as wet prep + for BV.  Was given rx for Metrogel which she cannot afford and therefore hasn't taken anything. Essentially here now to 'get the pills instead of the gel'. Nl UA on 9/26, GC/chlam neg.  Obstetrical/Gynecological History: OB History    Grav Para Term Preterm Abortions TAB SAB Ect Mult Living   1               Past Medical History: No past medical history on file.  Past Surgical History: No past surgical history on file.  Family History: No family history on file.  Social History: History  Substance Use Topics  . Smoking status: Not on file  . Smokeless tobacco: Not on file  . Alcohol Use: Not on file    Allergies: Allergies not on file  No prescriptions prior to admission    Review of Systems - Negative except ? dysuria General ROS: negative  Physical Exam   Blood pressure 97/52, pulse 47, temperature 99.1 F (37.3 C), temperature source Oral, resp. rate 20, height 5\' 7"  (1.702 m), weight 47.628 kg (105 lb), last menstrual period 01/29/2011.  General: General appearance - alert, well appearing, and in no distress and oriented to person, place, and time Focused Gynecological Exam: examination not indicated  Labs: Recent Results (from the past 24 hour(s))  POCT PREGNANCY, URINE   Collection Time   03/14/11  8:45 PM      Component Value Range   Preg Test, Ur POSITIVE     Imaging Studies:  none Assessment: Early preg BV- desirous of pills vs gel  Plan: Will give rx for Flagyl 500mg  BID x 7d (and for Terazol due to report of yeast when tx for BV) D/C home Enc to seek prenatal care as soon as possible. Enc to stop smoking MJ as well.  [name] 03/14/2011 8:54 PM

## 2011-03-17 NOTE — ED Provider Notes (Signed)
Agree with above note.  Courtney Adams H. 03/17/2011 5:23 AM

## 2011-03-22 LAB — POCT URINALYSIS DIP (DEVICE)
Bilirubin Urine: NEGATIVE
Glucose, UA: NEGATIVE
Hgb urine dipstick: NEGATIVE
Ketones, ur: NEGATIVE
Nitrite: NEGATIVE
Operator id: 116391
Protein, ur: 30 — AB
Specific Gravity, Urine: >=1.03
Urobilinogen, UA: 0.2
pH: 5.5

## 2011-03-22 LAB — WET PREP, GENITAL: Yeast Wet Prep HPF POC: NONE SEEN

## 2011-03-22 LAB — GC/CHLAMYDIA PROBE AMP, GENITAL
Chlamydia, DNA Probe: NEGATIVE
GC Probe Amp, Genital: NEGATIVE

## 2011-03-22 LAB — HIV ANTIBODY (ROUTINE TESTING W REFLEX): HIV: NONREACTIVE

## 2011-03-22 LAB — POCT PREGNANCY, URINE
Operator id: 116391
Preg Test, Ur: NEGATIVE

## 2011-03-22 LAB — RPR: RPR Ser Ql: NONREACTIVE

## 2011-03-24 ENCOUNTER — Encounter (HOSPITAL_COMMUNITY): Payer: Self-pay | Admitting: *Deleted

## 2011-03-24 ENCOUNTER — Inpatient Hospital Stay (HOSPITAL_COMMUNITY): Payer: Medicaid Other

## 2011-03-24 ENCOUNTER — Inpatient Hospital Stay (HOSPITAL_COMMUNITY)
Admission: AD | Admit: 2011-03-24 | Discharge: 2011-03-24 | Disposition: A | Discharge status: Home / Self Care | Payer: Medicaid Other | Source: Ambulatory Visit | Attending: Obstetrics & Gynecology | Admitting: Obstetrics & Gynecology

## 2011-03-24 DIAGNOSIS — O209 Hemorrhage in early pregnancy, unspecified: Secondary | ICD-10-CM

## 2011-03-24 LAB — CBC
HCT: 36.5 % (ref 36.0–46.0)
Hemoglobin: 12.4 g/dL (ref 12.0–15.0)
MCH: 32 pg (ref 26.0–34.0)
MCHC: 34 g/dL (ref 30.0–36.0)
MCV: 94.1 fL (ref 78.0–100.0)
Platelets: 217 10*3/uL (ref 150–400)
RBC: 3.88 MIL/uL (ref 3.87–5.11)
RDW: 11.7 % (ref 11.5–15.5)
WBC: 5.2 10*3/uL (ref 4.0–10.5)

## 2011-03-24 LAB — WET PREP, GENITAL
Trich, Wet Prep: NONE SEEN
Yeast Wet Prep HPF POC: NONE SEEN

## 2011-03-24 LAB — ABO/RH: ABO/RH(D): O POS

## 2011-03-24 LAB — HCG, QUANTITATIVE, PREGNANCY: hCG, Beta Chain, Quant, S: 22636 m[IU]/mL — ABNORMAL HIGH (ref <5)

## 2011-03-24 MED ORDER — FLUCONAZOLE 150 MG PO TABS: 150.0000 mg | ORAL_TABLET | Freq: Once | ORAL | Status: AC

## 2011-03-24 MED ORDER — METRONIDAZOLE 500 MG PO TABS: 500.0000 mg | ORAL_TABLET | Freq: Two times a day (BID) | ORAL | Status: AC

## 2011-03-24 NOTE — Progress Notes (Signed)
Pt was seen at [facility] on 03/10/2011-was DX with bacterial vaginosis and told she was preg. But no Due date given-she now has clumpy white thick discharge mixed with blood and wants to know a due date
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
## 2011-03-24 NOTE — Progress Notes (Signed)
Pt in c/o thick, clumpy, dark blood-tinged white discharge x4 days.  Has been taking metrogel for bv from oct 1st-5th.  Was told she was pregnant on 03/10/11 at [facility].  Denies any bleeding or cramping.

## 2011-03-25 LAB — GC/CHLAMYDIA PROBE AMP, GENITAL
Chlamydia, DNA Probe: NEGATIVE
GC Probe Amp, Genital: NEGATIVE

## 2011-08-11 ENCOUNTER — Emergency Department (HOSPITAL_COMMUNITY)
Admission: EM | Admit: 2011-08-11 | Discharge: 2011-08-12 | Disposition: A | Discharge status: Home / Self Care | Payer: Medicaid Other | Attending: Emergency Medicine | Admitting: Emergency Medicine

## 2011-08-11 ENCOUNTER — Encounter (HOSPITAL_COMMUNITY): Payer: Self-pay | Admitting: Emergency Medicine

## 2011-08-11 DIAGNOSIS — F17200 Nicotine dependence, unspecified, uncomplicated: Secondary | ICD-10-CM | POA: Insufficient documentation

## 2011-08-11 DIAGNOSIS — R51 Headache: Secondary | ICD-10-CM | POA: Insufficient documentation

## 2011-08-11 DIAGNOSIS — Y92410 Unspecified street and highway as the place of occurrence of the external cause: Secondary | ICD-10-CM | POA: Insufficient documentation

## 2011-08-11 DIAGNOSIS — M549 Dorsalgia, unspecified: Secondary | ICD-10-CM | POA: Insufficient documentation
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
## 2011-08-12 MED ORDER — IBUPROFEN 200 MG PO TABS
600.0000 mg | ORAL_TABLET | Freq: Once | ORAL | Status: AC
  Administered 2011-08-12: 600 mg via ORAL
  Filled 2011-08-12: qty 3

## 2011-08-12 MED ORDER — IBUPROFEN 600 MG PO TABS: 600.0000 mg | ORAL_TABLET | Freq: Once | ORAL | Status: AC

## 2011-08-12 NOTE — Discharge Instructions (Signed)

## 2011-08-12 NOTE — ED Provider Notes (Signed)
Medical screening examination/treatment/procedure(s) were performed by non-physician practitioner and as supervising physician I was immediately available for consultation/collaboration.   Laray Anger, DO 08/12/11 1621
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
## 2011-10-05 ENCOUNTER — Encounter (HOSPITAL_COMMUNITY): Payer: Self-pay | Admitting: *Deleted

## 2011-10-05 ENCOUNTER — Emergency Department (HOSPITAL_COMMUNITY)
Admission: EM | Admit: 2011-10-05 | Discharge: 2011-10-05 | Disposition: A | Discharge status: Home / Self Care | Payer: Medicaid Other | Attending: Emergency Medicine | Admitting: Emergency Medicine

## 2011-10-05 DIAGNOSIS — M545 Low back pain, unspecified: Secondary | ICD-10-CM | POA: Insufficient documentation

## 2011-10-05 DIAGNOSIS — M546 Pain in thoracic spine: Secondary | ICD-10-CM | POA: Insufficient documentation

## 2011-10-05 DIAGNOSIS — S239XXA Sprain of unspecified parts of thorax, initial encounter: Secondary | ICD-10-CM | POA: Insufficient documentation

## 2011-10-05 MED ORDER — IBUPROFEN 200 MG PO TABS
600.0000 mg | ORAL_TABLET | Freq: Once | ORAL | Status: AC
  Administered 2011-10-05: 600 mg via ORAL
  Filled 2011-10-05: qty 3

## 2011-10-05 MED ORDER — IBUPROFEN 600 MG PO TABS: 600.0000 mg | ORAL_TABLET | Freq: Three times a day (TID) | ORAL | Status: AC | PRN
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
## 2011-12-31 ENCOUNTER — Emergency Department (HOSPITAL_COMMUNITY)
Admission: EM | Admit: 2011-12-31 | Discharge: 2011-12-31 | Disposition: A | Discharge status: Home / Self Care | Payer: Medicaid Other | Attending: Emergency Medicine | Admitting: Emergency Medicine

## 2011-12-31 ENCOUNTER — Encounter (HOSPITAL_COMMUNITY): Payer: Self-pay | Admitting: *Deleted

## 2011-12-31 DIAGNOSIS — L259 Unspecified contact dermatitis, unspecified cause: Secondary | ICD-10-CM

## 2011-12-31 DIAGNOSIS — F17200 Nicotine dependence, unspecified, uncomplicated: Secondary | ICD-10-CM | POA: Insufficient documentation

## 2011-12-31 DIAGNOSIS — F411 Generalized anxiety disorder: Secondary | ICD-10-CM | POA: Insufficient documentation

## 2011-12-31 MED ORDER — DIPHENHYDRAMINE HCL 25 MG PO CAPS: 25.0000 mg | ORAL_CAPSULE | Freq: Four times a day (QID) | ORAL | Status: DC | PRN

## 2011-12-31 MED ORDER — PREDNISONE 20 MG PO TABS: 40.0000 mg | ORAL_TABLET | Freq: Every day | ORAL | Status: DC

## 2011-12-31 NOTE — ED Provider Notes (Signed)
Medical screening examination/treatment/procedure(s) were performed by non-physician practitioner and as supervising physician I was immediately available for consultation/collaboration.   Paizlie Klaus, MD 12/31/11 2347 

## 2011-12-31 NOTE — ED Provider Notes (Signed)
History     CSN: [id number]  Arrival date & time 12/31/11  4098   First MD Initiated Contact with Patient 12/31/11 2055      Chief Complaint  Patient presents with  . Rash    (Consider location/radiation/quality/duration/timing/severity/associated sxs/prior treatment) Patient is a 26 y.o. female presenting with rash. The history is provided by the patient.  Rash  This is a new problem. The current episode started 2 days ago. The problem has been gradually worsening. The problem is associated with an unknown factor. There has been no fever. The rash is present on the torso.  pt with red raised rash to the right lower abdomen right flank. Itchy. No rash anywhere else on the body. States she does spend a lot of time outside, also recently new body wash. No treatments tried. No one in family with the same.   Past Medical History  Diagnosis Date  . BV (bacterial vaginosis)   . Anxiety   . Preterm delivery   . Chlamydia   . Gonorrhea   . Trichomonas     Past Surgical History  Procedure Date  . Dilation and curettage of uterus     No family history on file.  History  Substance Use Topics  . Smoking status: Current Everyday Smoker    Types: Cigarettes  . Smokeless tobacco: Never Used  . Alcohol Use: No    OB History    Grav Para Term Preterm Abortions TAB SAB Ect Mult Living   3 1  1 1 1    1       Review of Systems  Constitutional: Negative for fever and chills.  HENT: Negative.   Eyes: Negative for redness.  Cardiovascular: Negative.   Skin: Positive for rash.  Neurological: Negative for dizziness and weakness.    Allergies  Review of patient's allergies indicates no known allergies.  Home Medications   Current Outpatient Rx  Name Route Sig Dispense Refill  . AMOXICILLIN 500 MG PO CAPS Oral Take 500 mg by mouth every 8 (eight) hours.    [name] HYDROCODONE-ACETAMINOPHEN 5-325 MG PO TABS Oral Take 1 tablet by mouth every 6 (six) hours as needed. For pain    .  NAPROXEN 500 MG PO TABS Oral Take 500 mg by mouth every 8 (eight) hours as needed. For pain      BP 127/73  Pulse 84  Temp 98.1 F (36.7 C) (Oral)  Resp 14  SpO2 100%  LMP 01/30/2011  Breastfeeding? Unknown  Physical Exam  Nursing note and vitals reviewed. Constitutional: She appears well-developed and well-nourished. No distress.  Neck: Neck supple.  Cardiovascular: Normal rate, regular rhythm and normal heart sounds.   Pulmonary/Chest: Effort normal and breath sounds normal. No respiratory distress. She has no wheezes. She has no rales.  Abdominal: Soft. Bowel sounds are normal. She exhibits no distension. There is no tenderness. There is no rebound.  Skin: Skin is warm and dry.       Papular erythemous raised rash to the right lower abdomen, right flank, in linear pattern. No blisters no vesicles no drainage  Psychiatric: She has a normal mood and affect.    ED Course  Procedures (including critical care time)  Rash most consistent with contact dermatitis. Since covers a large area, will treat with oral steroids H1 blockers, and follow up.  1. Contact dermatitis       MDM          Lottie Mussel, PA 12/31/11 2334

## 2011-12-31 NOTE — ED Notes (Signed)
Pt ambulated with a steady gait; VSS; A&Ox3; nos gins for distress; respirations even and unlabored; skin warm and dry; no questions.

## 2011-12-31 NOTE — ED Notes (Signed)
The pt has had a rash on her rt abd through to her rt flank for 3 days itching

## 2012-03-17 ENCOUNTER — Emergency Department (INDEPENDENT_AMBULATORY_CARE_PROVIDER_SITE_OTHER)
Admission: EM | Admit: 2012-03-17 | Discharge: 2012-03-17 | Disposition: A | Discharge status: Home / Self Care | Payer: Medicaid Other | Source: Home / Self Care

## 2012-03-17 ENCOUNTER — Encounter (HOSPITAL_COMMUNITY): Payer: Self-pay | Admitting: *Deleted

## 2012-03-17 DIAGNOSIS — N390 Urinary tract infection, site not specified: Secondary | ICD-10-CM

## 2012-03-17 DIAGNOSIS — R42 Dizziness and giddiness: Secondary | ICD-10-CM

## 2012-03-17 LAB — POCT URINALYSIS DIP (DEVICE)
Bilirubin Urine: NEGATIVE
Glucose, UA: NEGATIVE mg/dL
Hgb urine dipstick: NEGATIVE
Ketones, ur: NEGATIVE mg/dL
Nitrite: POSITIVE — AB
Protein, ur: 30 mg/dL — AB
Specific Gravity, Urine: 1.025 (ref 1.005–1.030)
Urobilinogen, UA: 0.2 mg/dL (ref 0.0–1.0)
pH: 5.5 (ref 5.0–8.0)

## 2012-03-17 LAB — POCT PREGNANCY, URINE: Preg Test, Ur: NEGATIVE

## 2012-03-17 MED ORDER — MECLIZINE HCL 50 MG PO TABS: 25.0000 mg | ORAL_TABLET | Freq: Three times a day (TID) | ORAL | Status: DC | PRN

## 2012-03-17 MED ORDER — FLUCONAZOLE 150 MG PO TABS: ORAL_TABLET | ORAL | Status: DC

## 2012-03-17 MED ORDER — CEPHALEXIN 500 MG PO CAPS: 500.0000 mg | ORAL_CAPSULE | Freq: Three times a day (TID) | ORAL | Status: DC

## 2012-03-17 NOTE — ED Notes (Signed)
Pt  Reports  Symptoms  Of    Nausea   Lightheaded      Diarrhea   X   1  Day            She  Ambulated  To  Room  With a  Steady  Fluid  Gait                   She  Is  Awake  Alert  Oriented  Sitting  Upright  On  Exam table  Speaking in  Complete  sentances

## 2012-03-17 NOTE — ED Provider Notes (Signed)
History     CSN: [id number]  Arrival date & time 03/17/12  1902   None     Chief Complaint  Patient presents with  . Dizziness    (Consider location/radiation/quality/duration/timing/severity/associated sxs/prior treatment) HPI Comments: 26 year old female who developed nausea and lightheadedness last light. She had similar symptoms this morning. Lightheadedness develops when she stands up or sits up from a lying or sitting position. She's also had "stomach pain, but I have no idea where". When she became dizzy she take Naprosyn. When I placed her supine she pointed to her pelvis as a source of pain. Specifically, the suprapubic and left pelvis. She also complains of urinary frequency and mild dysuria. Denies bleeding from any source   Past Medical History  Diagnosis Date  . BV (bacterial vaginosis)   . Anxiety   . Preterm delivery   . Chlamydia   . Gonorrhea   . Trichomonas     Past Surgical History  Procedure Date  . Dilation and curettage of uterus     History reviewed. No pertinent family history.  History  Substance Use Topics  . Smoking status: Current Every Day Smoker    Types: Cigarettes  . Smokeless tobacco: Never Used  . Alcohol Use: No    OB History    Grav Para Term Preterm Abortions TAB SAB Ect Mult Living   3 1  1 1 1    1       Review of Systems  Constitutional: Negative for fever, activity change and fatigue.  HENT: Negative.   Respiratory: Negative for cough, shortness of breath and wheezing.   Cardiovascular: Negative for chest pain and palpitations.  Gastrointestinal: Positive for nausea and abdominal pain. Negative for vomiting and blood in stool.  Genitourinary: Positive for dysuria, urgency, frequency, difficulty urinating and pelvic pain. Negative for flank pain, vaginal bleeding, vaginal discharge, vaginal pain and menstrual problem.  Musculoskeletal: Negative.   Skin: Negative for color change, pallor and rash.  Neurological:  Positive for dizziness and light-headedness. Negative for tremors, syncope, numbness and headaches.    Allergies  Review of patient's allergies indicates no known allergies.  Home Medications   Current Outpatient Rx  Name Route Sig Dispense Refill  . AMOXICILLIN 500 MG PO CAPS Oral Take 500 mg by mouth every 8 (eight) hours.    . CEPHALEXIN 500 MG PO CAPS Oral Take 1 capsule (500 mg total) by mouth 3 (three) times daily. 21 capsule 0  . DIPHENHYDRAMINE HCL 25 MG PO CAPS Oral Take 1 capsule (25 mg total) by mouth every 6 (six) hours as needed for itching. 30 capsule 0  . FLUCONAZOLE 150 MG PO TABS  1 tab po x 1. May repeat in 72 hours if no improvement 2 tablet 0  . HYDROCODONE-ACETAMINOPHEN 5-325 MG PO TABS Oral Take 1 tablet by mouth every 6 (six) hours as needed. For pain    . MECLIZINE HCL 50 MG PO TABS Oral Take 0.5 tablets (25 mg total) by mouth 3 (three) times daily as needed for dizziness. 30 tablet 0  . NAPROXEN 500 MG PO TABS Oral Take 500 mg by mouth every 8 (eight) hours as needed. For pain    . PREDNISONE 20 MG PO TABS Oral Take 2 tablets (40 mg total) by mouth daily. 10 tablet 0    BP 120/77  Pulse 65  Temp 98.9 F (37.2 C) (Oral)  Resp 18  SpO2 99%  LMP 02/25/2012  Physical Exam  Constitutional: She is oriented to  person, place, and time. She appears well-developed and well-nourished. No distress.  HENT:  Head: Normocephalic and atraumatic.  Eyes: EOM are normal. Pupils are equal, round, and reactive to light.  Neck: Normal range of motion. Neck supple.  Cardiovascular: Normal rate and normal heart sounds.   Pulmonary/Chest: Effort normal and breath sounds normal. No respiratory distress. She has no wheezes.  Abdominal: Soft. She exhibits no mass. There is tenderness. There is no rebound and no guarding.       No direct abdominal tenderness however she is tender over the suprapubic and left pelvis.  Musculoskeletal: Normal range of motion.  Neurological: She is  alert and oriented to person, place, and time. No cranial nerve deficit.       When the patient was placed in a supine position I asked her to rotate her head from mid position to the right then to the left and mid position again. Then she placed her head just off the end of the exam table in a hyper extended position and perform the same maneuvers. These maneuvers produced dizziness but no vertigo.  Skin: Skin is warm and dry. No rash noted. No erythema.  Psychiatric: She has a normal mood and affect.    ED Course  Procedures (including critical care time)  Labs Reviewed  POCT URINALYSIS DIP (DEVICE) - Abnormal; Notable for the following:    Protein, ur 30 (*)     Nitrite POSITIVE (*)     Leukocytes, UA TRACE (*)  Biochemical Testing Only. Please order routine urinalysis from main lab if confirmatory testing is needed.   All other components within normal limits  POCT PREGNANCY, URINE   No results found.   1. UTI (lower urinary tract infection)   2. Dizziness - light-headed       MDM   Results for orders placed during the hospital encounter of 03/17/12  POCT URINALYSIS DIP (DEVICE)      Component Value Range   Glucose, UA NEGATIVE  NEGATIVE mg/dL   Bilirubin Urine NEGATIVE  NEGATIVE   Ketones, ur NEGATIVE  NEGATIVE mg/dL   Specific Gravity, Urine 1.025  1.005 - 1.030   Hgb urine dipstick NEGATIVE  NEGATIVE   pH 5.5  5.0 - 8.0   Protein, ur 30 (*) NEGATIVE mg/dL   Urobilinogen, UA 0.2  0.0 - 1.0 mg/dL   Nitrite POSITIVE (*) NEGATIVE   Leukocytes, UA TRACE (*) NEGATIVE  POCT PREGNANCY, URINE      Component Value Range   Preg Test, Ur NEGATIVE  NEGATIVE   Orthostatic vital signs do not reflect orthostasis. Keflex 500 3 times a day for 7 days. She was given 2 tablets Diflucan to take as directed if needed for yeast infection. Treatment plan fluids stay well hydrated. Antivert 25 mg 3 times a day when necessary dizziness Any worsening new symptoms or problems return as  needed; also keep appointment with your PCP later this month.         Hayden Rasmussen, NP 03/17/12 2043

## 2012-03-17 NOTE — ED Provider Notes (Signed)
Medical screening examination/treatment/procedure(s) were performed by non-physician practitioner and as supervising physician I was immediately available for consultation/collaboration.  Leslee Home, M.D.   Reuben Likes, MD 03/17/12 2045

## 2012-05-29 ENCOUNTER — Ambulatory Visit (INDEPENDENT_AMBULATORY_CARE_PROVIDER_SITE_OTHER): Payer: Medicaid Other | Admitting: Sports Medicine

## 2012-06-19 ENCOUNTER — Encounter: Payer: Self-pay | Admitting: Sports Medicine

## 2012-06-19 ENCOUNTER — Ambulatory Visit (INDEPENDENT_AMBULATORY_CARE_PROVIDER_SITE_OTHER): Payer: Medicaid Other | Admitting: Sports Medicine

## 2012-06-19 VITALS — BP 98/54 | HR 66 | Temp 99.3°F | Ht 67.0 in | Wt 108.5 lb

## 2012-06-19 DIAGNOSIS — F39 Unspecified mood [affective] disorder: Secondary | ICD-10-CM

## 2012-06-19 DIAGNOSIS — F1210 Cannabis abuse, uncomplicated: Secondary | ICD-10-CM | POA: Insufficient documentation

## 2012-06-19 DIAGNOSIS — F1910 Other psychoactive substance abuse, uncomplicated: Secondary | ICD-10-CM

## 2012-06-19 DIAGNOSIS — F17200 Nicotine dependence, unspecified, uncomplicated: Secondary | ICD-10-CM

## 2012-06-19 DIAGNOSIS — Z23 Encounter for immunization: Secondary | ICD-10-CM

## 2012-06-19 DIAGNOSIS — Z720 Tobacco use: Secondary | ICD-10-CM | POA: Insufficient documentation

## 2012-06-19 DIAGNOSIS — Z3169 Encounter for other general counseling and advice on procreation: Secondary | ICD-10-CM | POA: Insufficient documentation

## 2012-06-19 DIAGNOSIS — Z309 Encounter for contraceptive management, unspecified: Secondary | ICD-10-CM

## 2012-06-19 DIAGNOSIS — Z0000 Encounter for general adult medical examination without abnormal findings: Secondary | ICD-10-CM

## 2012-06-19 NOTE — Assessment & Plan Note (Signed)
PAP next visit when off period TDAP today Declines Flu Shot

## 2012-06-19 NOTE — Assessment & Plan Note (Signed)
Interested in Nexplanon Currently not using any form Does not wish to become pregnant

## 2012-06-19 NOTE — Patient Instructions (Addendum)
It was nice to see you today.   Today we discussed: 1. Anxiety & Mood  Please follow up with me in 2-6 weeks to further discuss your mood  2. Substance abuse  Please try to stop smoking Marijuana.  This will help you succeed in school and keep you out of trouble  3. Tobacco abuse  Great job on cutting back on your smoking.  Please consider signing up at [email or web address] to help you stay quit!  4. Contraception management  See below for more information on the Nexplanon.  i would Highly recommend you use a form of birth control otherwise I would expect you to become pregnant.  Remember to use condoms to help decrease your chance of having an STD  5. Health care maintenance  You received the TDAP today    Please plan to return to see me in 2-6 weeks.  If you need anything prior to seeing me please call the clinic.  Please Bring all medications with you to each appointment.   Etonogestrel implant What is this medicine? ETONOGESTREL is a contraceptive (birth control) device. It is used to prevent pregnancy. It can be used for up to 3 years. This medicine may be used for other purposes; ask your health care provider or pharmacist if you have questions. What should I tell my health care provider before I take this medicine? They need to know if you have any of these conditions: -abnormal vaginal bleeding -blood vessel disease or blood clots -cancer of the breast, cervix, or liver -depression -diabetes -gallbladder disease -headaches -heart disease or recent heart attack -high blood pressure -high cholesterol -kidney disease -liver disease -renal disease -seizures -tobacco smoker -an unusual or allergic reaction to etonogestrel, other hormones, anesthetics or antiseptics, medicines, foods, dyes, or preservatives -pregnant or trying to get pregnant -breast-feeding How should I use this medicine? This device is inserted just under the skin on the inner side of your upper arm by a  health care professional. Talk to your pediatrician regarding the use of this medicine in children. Special care may be needed. Overdosage: If you think you've taken too much of this medicine contact a poison control center or emergency room at once. Overdosage: If you think you have taken too much of this medicine contact a poison control center or emergency room at once. NOTE: This medicine is only for you. Do not share this medicine with others. What if I miss a dose? This does not apply. What may interact with this medicine? Do not take this medicine with any of the following medications: -amprenavir -bosentan -fosamprenavir This medicine may also interact with the following medications: -barbiturate medicines for inducing sleep or treating seizures -certain medicines for fungal infections like ketoconazole and itraconazole -griseofulvin -medicines to treat seizures like carbamazepine, felbamate, oxcarbazepine, phenytoin, topiramate -modafinil -phenylbutazone -rifampin -some medicines to treat HIV infection like atazanavir, indinavir, lopinavir, nelfinavir, tipranavir, ritonavir -St. John's wort This list may not describe all possible interactions. Give your health care provider a list of all the medicines, herbs, non-prescription drugs, or dietary supplements you use. Also tell them if you smoke, drink alcohol, or use illegal drugs. Some items may interact with your medicine. What should I watch for while using this medicine? This product does not protect you against HIV infection (AIDS) or other sexually transmitted diseases. You should be able to feel the implant by pressing your fingertips over the skin where it was inserted. Tell your doctor if you cannot feel the implant. What side  effects may I notice from receiving this medicine? Side effects that you should report to your doctor or health care professional as soon as possible: -allergic reactions like skin rash, itching or  hives, swelling of the face, lips, or tongue -breast lumps -changes in vision -confusion, trouble speaking or understanding -dark urine -depressed mood -general ill feeling or flu-like symptoms -light-colored stools -loss of appetite, nausea -right upper belly pain -severe headaches -severe pain, swelling, or tenderness in the abdomen -shortness of breath, chest pain, swelling in a leg -signs of pregnancy -sudden numbness or weakness of the face, arm or leg -trouble walking, dizziness, loss of balance or coordination -unusual vaginal bleeding, discharge -unusually weak or tired -yellowing of the eyes or skin Side effects that usually do not require medical attention (Report these to your doctor or health care professional if they continue or are bothersome.): -acne -breast pain -changes in weight -cough -fever or chills -headache -irregular menstrual bleeding -itching, burning, and vaginal discharge -pain or difficulty passing urine -sore throat This list may not describe all possible side effects. Call your doctor for medical advice about side effects. You may report side effects to FDA at 1-800-FDA-1088. Where should I keep my medicine? This drug is given in a hospital or clinic and will not be stored at home. NOTE: This sheet is a summary. It may not cover all possible information. If you have questions about this medicine, talk to your doctor, pharmacist, or health care provider.  2013, [employer]. (02/21/2009 3:54:17 PM)

## 2012-06-19 NOTE — Assessment & Plan Note (Signed)
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

## 2012-06-23 ENCOUNTER — Encounter: Payer: Self-pay | Admitting: Sports Medicine

## 2012-06-23 NOTE — Progress Notes (Signed)
  [facility]  Patient name: Courtney Adams MRN [id number]  Date of birth: [birth year]  CC & HPI:  Courtney Adams is a 27 y.o. female presenting today to establish care.  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  Denies current interference in everyday life.  No SI/HI.    # Prior undesired pregnancies: currently sexually active.  Not using any form of contraception.  Not interested in starting pill.  Open to other long term forms.  # Health Care maintenance.  Due for PAP smear, currently on menses  ------------------------------------------------------------------------------------------------------------------ Medication Compliance: no chronic meds  Diet Compliance: noncompliant much of the time  ------------------------------------------------------------------------------------------------------------------ Acute Concerns:  None   ROS:  REports needing glasses but hasn't been to eye doctor since she was a child Otherwise reports issues with anxiety.  Occasional headaches associated with not having glasses.    Denies fevers, chills.  Cough, congestion, melana/hematachezia.  Denies wheezing   Pertinent History Reviewed:  Medical & Surgical Hx:  Reviewed & Updated - see associated sections in EMR. Medications: Reviewed & Updated - see associated section in EMR. Prior to Admission medications   Medication Sig Start Date End Date Taking? Authorizing Provider  diphenhydrAMINE (BENADRYL) 25 mg capsule Take 1 capsule (25 mg total) by mouth every 6 (six) hours as needed for itching. 12/31/11 01/10/12  Lottie Mussel, PA   Social History: Reviewed & Updated - see associated section in EMR.  Significant for  reports that she has been smoking Cigarettes.  She has never used smokeless tobacco.  Objective Findings:  Vitals:  Filed Vitals:   06/19/12 0840  BP: 98/54  Pulse: 66  Temp: 99.3 F (37.4 C)     PE: GENERAL:  Adult AA female. In no discomfort; no respiratory distress. PSYCH: Alert and appropriately interactive; Insight:Fair   MDQ - Extent of problem: minor 1 2 3 4 5 6 7 8 9 10 11 12 13  Total:  Yes Yes Yes Yes No Yes Yes Yes Yes Yes Yes  No 10/13 (12)  1+ symptom @ same time:   Yes Family History:  Unknown Personal History:  Yes  PHQ-9 - Difficulty: Somewhat 1 2 3 4 5 6 7 8 9  Total:  1 0 1 1  1 3  0 0 7   H&N: AT/[place], trachea midline EENT:  MMM, no scleral icterus, EOMi HEART: RRR, S1/S2 heard, no murmur LUNGS: CTA B, no wheezes, no crackles EXTREMITIES: Moves all 4 extremities spontaneously, warm well perfused, no edema, bilateral DP and PT pulses 2/4.      Assessment & Plan:

## 2012-07-02 ENCOUNTER — Emergency Department (HOSPITAL_COMMUNITY)
Admission: EM | Admit: 2012-07-02 | Discharge: 2012-07-02 | Disposition: A | Discharge status: Home / Self Care | Payer: Medicaid Other | Attending: Emergency Medicine | Admitting: Emergency Medicine

## 2012-07-02 ENCOUNTER — Encounter (HOSPITAL_COMMUNITY): Payer: Self-pay | Admitting: *Deleted

## 2012-07-02 DIAGNOSIS — R05 Cough: Secondary | ICD-10-CM | POA: Insufficient documentation

## 2012-07-02 DIAGNOSIS — R059 Cough, unspecified: Secondary | ICD-10-CM | POA: Insufficient documentation

## 2012-07-02 DIAGNOSIS — F17200 Nicotine dependence, unspecified, uncomplicated: Secondary | ICD-10-CM | POA: Insufficient documentation

## 2012-07-02 DIAGNOSIS — Z3202 Encounter for pregnancy test, result negative: Secondary | ICD-10-CM | POA: Insufficient documentation

## 2012-07-02 LAB — POCT PREGNANCY, URINE: Preg Test, Ur: NEGATIVE

## 2012-07-02 MED ORDER — DEXTROMETHORPHAN POLISTIREX 30 MG/5ML PO LQCR: 15.0000 mg | Freq: Two times a day (BID) | ORAL | Status: DC

## 2012-07-02 MED ORDER — DEXTROMETHORPHAN POLISTIREX 30 MG/5ML PO LQCR
15.0000 mg | Freq: Once | ORAL | Status: AC
  Administered 2012-07-02: 15 mg via ORAL
  Filled 2012-07-02: qty 5

## 2012-07-02 NOTE — ED Notes (Signed)
Pt discharged.Vital signs stable and GCS 15 

## 2012-07-02 NOTE — ED Provider Notes (Signed)
History     CSN: [id number]  Arrival date & time 07/02/12  0015   First MD Initiated Contact with Patient 07/02/12 0040      Chief Complaint  Patient presents with  . Cough    (Consider location/radiation/quality/duration/timing/severity/associated sxs/prior treatment) HPI Comments: Patient reports cough that occures only at night Started 2 weeks ago with URI that has resolved Is not currently taking any medication for the cough.   Patient is a 27 y.o. female presenting with cough. The history is provided by the patient.  Cough This is a new problem. The current episode started more than 1 week ago. The problem has not changed since onset.The cough is non-productive. Pertinent negatives include no chest pain, no chills, no rhinorrhea, no sore throat, no shortness of breath and no wheezing.    Past Medical History  Diagnosis Date  . BV (bacterial vaginosis)   . Anxiety   . Preterm delivery   . Chlamydia   . Gonorrhea   . Trichomonas     Past Surgical History  Procedure Date  . Dilation and curettage of uterus     No family history on file.  History  Substance Use Topics  . Smoking status: Current Every Day Smoker    Types: Cigarettes  . Smokeless tobacco: Never Used  . Alcohol Use: No    OB History    Grav Para Term Preterm Abortions TAB SAB Ect Mult Living   3 1  1 2 2    1       Review of Systems  Constitutional: Negative for fever and chills.  HENT: Negative for congestion, sore throat, rhinorrhea and postnasal drip.   Respiratory: Positive for cough. Negative for shortness of breath and wheezing.   Cardiovascular: Negative for chest pain.  Gastrointestinal: Negative.   Genitourinary: Negative.   Musculoskeletal: Negative.   Skin: Negative.   All other systems reviewed and are negative.    Allergies  Review of patient's allergies indicates no known allergies.  Home Medications   Current Outpatient Rx  Name  Route  Sig  Dispense  Refill  .  DIPHENHYDRAMINE HCL 25 MG PO CAPS   Oral   Take 1 capsule (25 mg total) by mouth every 6 (six) hours as needed for itching.   30 capsule   0     BP 114/75  Pulse 79  Temp 98.8 F (37.1 C) (Oral)  Resp 18  SpO2 100%  LMP 05/18/2012  Physical Exam  Constitutional: She appears well-developed and well-nourished.  HENT:  Head: Normocephalic and atraumatic.  Right Ear: External ear normal.  Left Ear: External ear normal.  Mouth/Throat: Oropharynx is clear and moist. No oropharyngeal exudate.  Eyes: Pupils are equal, round, and reactive to light.  Neck: Normal range of motion.  Cardiovascular: Normal rate.   Pulmonary/Chest: Effort normal.  Musculoskeletal: Normal range of motion.  Neurological: She is alert.  Skin: Skin is warm.    ED Course  Procedures (including critical care time)   Labs Reviewed  POCT PREGNANCY, URINE   No results found.   1. Cough       MDM  Cough occasional wakes her form sleep         Arman Filter, NP 07/02/12 0132

## 2012-07-02 NOTE — ED Notes (Signed)
X 2 weeks of non-productive; had some mucous from nose. No sob.

## 2012-07-03 NOTE — ED Provider Notes (Signed)
Medical screening examination/treatment/procedure(s) were performed by non-physician practitioner and as supervising physician I was immediately available for consultation/collaboration.  Sharicka Pogorzelski J. Annajulia Lewing, MD 07/03/12 0039 

## 2012-07-19 ENCOUNTER — Encounter: Payer: Medicaid Other | Admitting: Sports Medicine

## 2012-07-26 ENCOUNTER — Other Ambulatory Visit (HOSPITAL_COMMUNITY)
Admission: RE | Admit: 2012-07-26 | Discharge: 2012-07-26 | Disposition: A | Discharge status: Home / Self Care | Payer: Medicaid Other | Source: Ambulatory Visit | Attending: Family Medicine | Admitting: Family Medicine

## 2012-07-26 ENCOUNTER — Encounter: Payer: Self-pay | Admitting: Family Medicine

## 2012-07-26 ENCOUNTER — Ambulatory Visit (INDEPENDENT_AMBULATORY_CARE_PROVIDER_SITE_OTHER): Payer: Medicaid Other | Admitting: Family Medicine

## 2012-07-26 VITALS — BP 102/69 | HR 76 | Temp 98.1°F | Ht 67.0 in | Wt 108.0 lb

## 2012-07-26 DIAGNOSIS — Z2089 Contact with and (suspected) exposure to other communicable diseases: Secondary | ICD-10-CM

## 2012-07-26 DIAGNOSIS — Z202 Contact with and (suspected) exposure to infections with a predominantly sexual mode of transmission: Secondary | ICD-10-CM

## 2012-07-26 DIAGNOSIS — N730 Acute parametritis and pelvic cellulitis: Secondary | ICD-10-CM | POA: Insufficient documentation

## 2012-07-26 DIAGNOSIS — Z113 Encounter for screening for infections with a predominantly sexual mode of transmission: Secondary | ICD-10-CM | POA: Insufficient documentation

## 2012-07-26 DIAGNOSIS — R109 Unspecified abdominal pain: Secondary | ICD-10-CM

## 2012-07-26 LAB — POCT URINE PREGNANCY: Preg Test, Ur: NEGATIVE

## 2012-07-26 LAB — POCT WET PREP (WET MOUNT): Clue Cells Wet Prep Whiff POC: POSITIVE

## 2012-07-26 MED ORDER — CEFTRIAXONE SODIUM 1 G IJ SOLR
250.0000 mg | Freq: Once | INTRAMUSCULAR | Status: AC
  Administered 2012-07-26: 250 mg via INTRAMUSCULAR

## 2012-07-26 MED ORDER — FLUCONAZOLE 150 MG PO TABS: 150.0000 mg | ORAL_TABLET | ORAL | Status: AC

## 2012-07-26 MED ORDER — DOXYCYCLINE HYCLATE 100 MG PO TABS: 100.0000 mg | ORAL_TABLET | Freq: Two times a day (BID) | ORAL | Status: AC

## 2012-07-26 NOTE — Assessment & Plan Note (Signed)
Acute.   Not severe but given she is at high risk will treat for PID.  Cultures and std blood tests done.   Counseled on prevention

## 2012-07-26 NOTE — Patient Instructions (Addendum)
Take the doxycycline twice daily until all gone  Take diflucan one tablet in now and then again in one week   We will let you know about your lab tests  Consider always using a condom   If you do not have a your normal menstrual period come back for a pregnancy check   Come back and see Dr Berline Chough in 2-4 weeks

## 2012-07-26 NOTE — Progress Notes (Signed)
  Subjective:    Patient ID: Courtney Adams, female    DOB: October 04, 1985, 27 y.o.   MRN: [id number]  HPI  VAGINAL DISCHARGE  Onset: about a week ago Description: gray copious Odor: smells  Itching: no  Symptoms Dysuria: no  Bleeding: yes, has irregular menstrual periods normall Pelvic pain: yes, mild  Back pain: no  Fever: no  Genital sores: no  Rash: no  Dyspareunia: no  GI Sxs: no  Prior treatment: no   Red Flags: Missed period: hard to say since irregular   Pregnancy: no  Recent antibiotics: no  Sexual activity: yes  Possible STD exposure: unsure Did not use condom IUD: no  Diabetes: no   PMH She has frequent vaginal infections. She is not sure if has had PID before.  Is not on contraception but is considering   Review of Systems     Objective:   Physical Exam  Alert no acute distress Abdomen - mild bilateral lower abdomen suprapubic tenderness no guarding or rebound Genitalia:  Normal introitus for age, no external lesions, thick white malodorous vaginal discharge, mucosa pink and moist, no vaginal or cervical lesions,  normal uterus size and position, no adnexal masses is moderately tender with cervical motion       Assessment & Plan:

## 2012-07-27 LAB — RPR

## 2012-07-27 LAB — HIV ANTIBODY (ROUTINE TESTING W REFLEX): HIV: NONREACTIVE

## 2012-08-23 ENCOUNTER — Telehealth: Payer: Self-pay | Admitting: Sports Medicine

## 2012-08-23 NOTE — Telephone Encounter (Signed)
Patient is calling because when she was here in February and was told that she had a bacterial infection which is causing the smell,  The meds at that time helped some but she still has the smell and was told if the problem continued to call back for a antibiotic  She uses [facility] on [address]

## 2012-08-24 ENCOUNTER — Encounter: Payer: Self-pay | Admitting: Family Medicine

## 2012-08-24 ENCOUNTER — Ambulatory Visit (INDEPENDENT_AMBULATORY_CARE_PROVIDER_SITE_OTHER): Payer: Medicaid Other | Admitting: Family Medicine

## 2012-08-24 VITALS — BP 120/68 | HR 73 | Temp 98.8°F | Ht 67.0 in | Wt 104.0 lb

## 2012-08-24 DIAGNOSIS — B9689 Other specified bacterial agents as the cause of diseases classified elsewhere: Secondary | ICD-10-CM | POA: Insufficient documentation

## 2012-08-24 DIAGNOSIS — N760 Acute vaginitis: Secondary | ICD-10-CM

## 2012-08-24 DIAGNOSIS — A499 Bacterial infection, unspecified: Secondary | ICD-10-CM

## 2012-08-24 DIAGNOSIS — B379 Candidiasis, unspecified: Secondary | ICD-10-CM

## 2012-08-24 DIAGNOSIS — B49 Unspecified mycosis: Secondary | ICD-10-CM

## 2012-08-24 LAB — POCT WET PREP (WET MOUNT)
Clue Cells Wet Prep Whiff POC: POSITIVE
WBC, Wet Prep HPF POC: <5

## 2012-08-24 MED ORDER — FLUCONAZOLE 150 MG PO TABS: 150.0000 mg | ORAL_TABLET | Freq: Once | ORAL | Status: DC

## 2012-08-24 MED ORDER — METRONIDAZOLE 0.75 % VA GEL: 1.0000 | Freq: Two times a day (BID) | VAGINAL | Status: DC

## 2012-08-24 NOTE — Telephone Encounter (Signed)
I have not seen this patient for this problem.  Dr. Deirdre Priest did. We can discuss at her next appointment and re-test at that time Please call and inform

## 2012-08-24 NOTE — Patient Instructions (Signed)
It was good to see you! I have sent in a vaginal cream to treat the bacterial vaginosis.  It may take [place] several days to get this approved. I have sent in diflucan for your yeast infection.

## 2012-08-24 NOTE — Assessment & Plan Note (Signed)
Has been treated several times with oral flagyl.  Will treat this time with topical as has recurred in the past.  Will also give diflucan as a PRN given recurrent yeast infections.  No current evidence of PID.
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
## 2012-08-24 NOTE — Telephone Encounter (Signed)
Pt insist on being seen today,doesn't want to wait til follow up on the 19th with Dr Berline Chough. Courtney Adams, [name]

## 2012-08-24 NOTE — Telephone Encounter (Signed)
Pt states she was instructed by Dr Berline Chough to call office if antibiotic didn't work and that he would call in another.Pt does have a follow up appointment with you  on the 19th. Please advise. Courtney Adams, Courtney Adams

## 2012-08-30 ENCOUNTER — Ambulatory Visit (INDEPENDENT_AMBULATORY_CARE_PROVIDER_SITE_OTHER): Payer: Medicaid Other | Admitting: Sports Medicine

## 2012-08-30 ENCOUNTER — Encounter: Payer: Self-pay | Admitting: Sports Medicine

## 2012-08-30 VITALS — BP 110/65 | HR 94 | Temp 99.8°F | Ht 67.0 in | Wt 108.0 lb

## 2012-08-30 DIAGNOSIS — F39 Unspecified mood [affective] disorder: Secondary | ICD-10-CM

## 2012-08-30 DIAGNOSIS — Z0000 Encounter for general adult medical examination without abnormal findings: Secondary | ICD-10-CM

## 2012-08-30 NOTE — Patient Instructions (Signed)
It was nice to see you today.   Today we discussed: 1. Health care maintenance Nelva Bush our social worker will be in touch  2. Mood disorder I recommend you touch base with [facility] to discuss counseling options I have started you on Zoloft    Please plan to return to see me in 1 week for a PAP smear.  If you need anything prior to seeing me please call the clinic.  Please Bring all medications with you to each appointment.

## 2012-08-30 NOTE — Assessment & Plan Note (Signed)
Will trial Zoloft F/u in 1 week > Watch for mania, consider addition of Mood stabilizer

## 2012-08-30 NOTE — Assessment & Plan Note (Signed)
Using Metrogel - defer PAP given elevated risk for false negative - reschedule for 1 week follow up to check mood as well
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
## 2012-09-13 ENCOUNTER — Ambulatory Visit: Payer: Medicaid Other | Admitting: Sports Medicine

## 2012-10-03 ENCOUNTER — Other Ambulatory Visit (HOSPITAL_COMMUNITY)
Admission: RE | Admit: 2012-10-03 | Discharge: 2012-10-03 | Disposition: A | Discharge status: Home / Self Care | Payer: Medicaid Other | Source: Ambulatory Visit | Attending: Family Medicine | Admitting: Family Medicine

## 2012-10-03 ENCOUNTER — Encounter: Payer: Self-pay | Admitting: Sports Medicine

## 2012-10-03 ENCOUNTER — Ambulatory Visit (INDEPENDENT_AMBULATORY_CARE_PROVIDER_SITE_OTHER): Payer: Medicaid Other | Admitting: Sports Medicine

## 2012-10-03 VITALS — BP 105/59 | HR 94 | Temp 99.7°F | Ht 67.0 in | Wt 103.0 lb

## 2012-10-03 DIAGNOSIS — Z01419 Encounter for gynecological examination (general) (routine) without abnormal findings: Secondary | ICD-10-CM

## 2012-10-03 DIAGNOSIS — Z720 Tobacco use: Secondary | ICD-10-CM

## 2012-10-03 DIAGNOSIS — N760 Acute vaginitis: Secondary | ICD-10-CM

## 2012-10-03 DIAGNOSIS — A499 Bacterial infection, unspecified: Secondary | ICD-10-CM

## 2012-10-03 DIAGNOSIS — Z309 Encounter for contraceptive management, unspecified: Secondary | ICD-10-CM

## 2012-10-03 DIAGNOSIS — F17200 Nicotine dependence, unspecified, uncomplicated: Secondary | ICD-10-CM

## 2012-10-03 DIAGNOSIS — F39 Unspecified mood [affective] disorder: Secondary | ICD-10-CM

## 2012-10-03 DIAGNOSIS — B9689 Other specified bacterial agents as the cause of diseases classified elsewhere: Secondary | ICD-10-CM

## 2012-10-03 DIAGNOSIS — Z124 Encounter for screening for malignant neoplasm of cervix: Secondary | ICD-10-CM

## 2012-10-03 DIAGNOSIS — Z113 Encounter for screening for infections with a predominantly sexual mode of transmission: Secondary | ICD-10-CM | POA: Insufficient documentation

## 2012-10-03 DIAGNOSIS — Z0000 Encounter for general adult medical examination without abnormal findings: Secondary | ICD-10-CM

## 2012-10-03 DIAGNOSIS — R634 Abnormal weight loss: Secondary | ICD-10-CM

## 2012-10-03 LAB — POCT WET PREP (WET MOUNT): Clue Cells Wet Prep Whiff POC: POSITIVE

## 2012-10-03 NOTE — Assessment & Plan Note (Signed)
Plans on Nexplanon.  Will schedule with me next month.  Offered other providers.  Pt currently abstinent

## 2012-10-03 NOTE — Patient Instructions (Addendum)
It was nice to see you today.   Today we discussed: 1. Well woman exam We have checked labs today. I will call you with any abnormal labs otherwise I will send you a letter with your normal results. - POCT Wet Prep ([place]) - LDL Cholesterol, Direct - Basic Metabolic Panel - HIV antibody - RPR  2. Contraception management We have scheduled you for a nexplanon placement next month. Please abstain from intercourse.  Remember to always use condoms to help decrease risk of Sexually transmitted infections.  effect  Please plan to return to see me in 1 month.  If you need anything prior to seeing me please call the clinic.  Please Bring all medications with you to each appointment.

## 2012-10-03 NOTE — Assessment & Plan Note (Signed)
PAP today GC Chlamydia LDL and BMET

## 2012-10-04 LAB — BASIC METABOLIC PANEL
BUN: 9 mg/dL (ref 6–23)
CO2: 28 mEq/L (ref 19–32)
Calcium: 9.5 mg/dL (ref 8.4–10.5)
Chloride: 104 mEq/L (ref 96–112)
Creat: 0.73 mg/dL (ref 0.50–1.10)
Glucose, Bld: 63 mg/dL — ABNORMAL LOW (ref 70–99)
Potassium: 3.5 mEq/L (ref 3.5–5.3)
Sodium: 138 mEq/L (ref 135–145)

## 2012-10-04 LAB — TSH: TSH: 1.246 u[IU]/mL (ref 0.350–4.500)

## 2012-10-04 LAB — T4, FREE: Free T4: 0.94 ng/dL (ref 0.80–1.80)

## 2012-10-04 LAB — LDL CHOLESTEROL, DIRECT: Direct LDL: 82 mg/dL

## 2012-10-04 LAB — HIV ANTIBODY (ROUTINE TESTING W REFLEX): HIV: NONREACTIVE

## 2012-10-04 LAB — RPR

## 2012-10-05 ENCOUNTER — Encounter: Payer: Self-pay | Admitting: Sports Medicine

## 2012-10-05 MED ORDER — METRONIDAZOLE 500 MG PO TABS: 500.0000 mg | ORAL_TABLET | Freq: Three times a day (TID) | ORAL | Status: DC

## 2012-10-05 NOTE — Assessment & Plan Note (Addendum)
If yeast has diflucan; if BV will need alternative tx (?clinda)

## 2012-10-05 NOTE — Progress Notes (Signed)
  [facility]  Patient name: Courtney Adams MRN [id number]  Date of birth: September 27, 1985  CC & HPI:  Courtney Adams is a 27 y.o. female presenting today for wellness visit.  # She reports continued to have some vaginal discharge and odor.  She has been using MetroGel previously but has not used in the last couple of weeks.  Reports no new sexual contact.  # Contraception: Interested in [place], has been abstinent,  periods q. 20-30 days.  5 days of bleeding.  No abnormal cramping  # Social situation and depression: Reports significantly improved.  Has not picked up antidepressant.  No legal charges within filed.  She remains with custody of her son.  ROS:  Per history of present illness  Pertinent History Reviewed:  Medical & Surgical Hx:  Reviewed: Significant for recurrent difficult to treat bacterial vaginosis Medications: Reviewed & Updated - see associated section Social History: Reviewed -  reports that she has been smoking Cigarettes.  She has been smoking about 0.00 packs per day. She has never used smokeless tobacco.  Objective Findings:  Vitals: BP 105/59  Pulse 94  Temp(Src) 99.7 F (37.6 C) (Oral)  Ht 5\' 7"  (1.702 m)  Wt 103 lb (46.72 kg)  BMI 16.13 kg/m2  PE: GENERAL:  Adult thin AA  female. In no discomfort; no respiratory distress. PELVIC: VULVA: normal appearing vulva with no masses, tenderness or lesions, VAGINA: normal appearing vagina with normal color and discharge, no lesions, CERVIX: thin white discharge. Wet Prep obtained. DNA probe for chlamydia and GC obtained, cervical motion tenderness absent, multiparous os, UTERUS: uterus is normal size, shape, consistency and nontender, ADNEXA: normal adnexa in size, nontender and no masses.  Exam chaperoned by CMA     Assessment & Plan:

## 2012-10-05 NOTE — Assessment & Plan Note (Signed)
Pre contemplative > contemplative

## 2012-10-05 NOTE — Assessment & Plan Note (Signed)
Stable,  Depression improved. Did not fill Zoloft

## 2012-10-06 ENCOUNTER — Encounter: Payer: Self-pay | Admitting: Sports Medicine

## 2012-10-31 ENCOUNTER — Ambulatory Visit (INDEPENDENT_AMBULATORY_CARE_PROVIDER_SITE_OTHER): Payer: BC Managed Care – PPO | Admitting: Sports Medicine

## 2012-10-31 VITALS — BP 109/69 | HR 78 | Temp 97.9°F | Ht 67.0 in | Wt 99.6 lb

## 2012-10-31 DIAGNOSIS — Z0000 Encounter for general adult medical examination without abnormal findings: Secondary | ICD-10-CM

## 2012-10-31 DIAGNOSIS — Z3046 Encounter for surveillance of implantable subdermal contraceptive: Secondary | ICD-10-CM

## 2012-10-31 DIAGNOSIS — Z309 Encounter for contraceptive management, unspecified: Secondary | ICD-10-CM

## 2012-10-31 DIAGNOSIS — Z30017 Encounter for initial prescription of implantable subdermal contraceptive: Secondary | ICD-10-CM

## 2012-10-31 LAB — POCT URINE PREGNANCY: Preg Test, Ur: NEGATIVE

## 2012-10-31 MED ORDER — ETONOGESTREL 68 MG ~~LOC~~ IMPL
68.0000 mg | DRUG_IMPLANT | Freq: Once | SUBCUTANEOUS | Status: AC
  Administered 2012-10-31: 68 mg via SUBCUTANEOUS

## 2012-10-31 NOTE — Progress Notes (Signed)
  [facility]  Patient name: Courtney Adams MRN [id number]  Date of birth: October 02, 1985  CC & HPI:  Courtney Adams is a 27 y.o. female presenting today for Nexplanon Insertion:  Discussed risks and benefits of the procedure.  Patient does wish to proceed.  Voices understanding of expected frequency of bleeding   ROS:  Denies any fevers, chills, nausea or vomiting.  Pertinent History Reviewed:  Medical & Surgical Hx:  Reviewed: Significant for mood disorder history of substance abuse, recurrent bacterial vaginosis. Medications: Reviewed & Updated - see associated section Social History: Reviewed -  reports that she has been smoking Cigarettes.  She has been smoking about 0.20 packs per day. She has never used smokeless tobacco.  Objective Findings:  Vitals: There were no vitals taken for this visit.  PE: GENERAL:  Adult African American female. In no discomfort; no respiratory distress. Psych: Appropriate mood and affect today. Upper extremity, left arm free of any skin lesions   Assessment & Plan:

## 2012-10-31 NOTE — Patient Instructions (Addendum)
It was good to see you today. Your Nexplanon will expire in 3 years from today. Please followup in a couple of weeks to discuss her ongoing medical care.  If you have any fevers or chills or significant swelling please call let us know.  It is okay to use ice on the area to decrease swelling.

## 2012-10-31 NOTE — Assessment & Plan Note (Signed)
PROCEDURE NOTE: NEXPLANON  INSERTION Patient given informed consent and signed copy in the chart.  Pregnancy test was NEGATIVE  Appropriate time out was taken. LEFT arm was prepped and draped in the usual sterile fashion. Appropriate measurement was made for insertion of nexplanon and landmarks identified, insertion site marked. Two cc of 2% lidocaine with epinephrine was used for local anesthesia. Once anesthesia obtained, nexplanon was inserted in typical fashion per manufacturer's directions.  No complications; less than 1cc blood loss.  Insertion site covered with steristrip & pressure bandage applied to decrease bruising. The patient tolerated the procedure well.  Patient given follow up instructions should she experience redness, swelling at sight or fever in the next 24 hours. Patient given Nexplanon pocket card.  Planned removal in 3 years.

## 2012-11-01 ENCOUNTER — Encounter (HOSPITAL_COMMUNITY): Payer: Self-pay | Admitting: Emergency Medicine

## 2012-11-01 ENCOUNTER — Encounter: Payer: Self-pay | Admitting: Sports Medicine

## 2012-11-01 ENCOUNTER — Emergency Department (HOSPITAL_COMMUNITY)
Admission: EM | Admit: 2012-11-01 | Discharge: 2012-11-01 | Disposition: A | Discharge status: Home / Self Care | Payer: BC Managed Care – PPO | Attending: Emergency Medicine | Admitting: Emergency Medicine

## 2012-11-01 DIAGNOSIS — T1590XA Foreign body on external eye, part unspecified, unspecified eye, initial encounter: Secondary | ICD-10-CM | POA: Insufficient documentation

## 2012-11-01 DIAGNOSIS — S058X9A Other injuries of unspecified eye and orbit, initial encounter: Secondary | ICD-10-CM | POA: Insufficient documentation

## 2012-11-01 DIAGNOSIS — H5789 Other specified disorders of eye and adnexa: Secondary | ICD-10-CM | POA: Insufficient documentation

## 2012-11-01 DIAGNOSIS — Z2089 Contact with and (suspected) exposure to other communicable diseases: Secondary | ICD-10-CM | POA: Insufficient documentation

## 2012-11-01 DIAGNOSIS — H53149 Visual discomfort, unspecified: Secondary | ICD-10-CM | POA: Insufficient documentation

## 2012-11-01 DIAGNOSIS — H5710 Ocular pain, unspecified eye: Secondary | ICD-10-CM | POA: Insufficient documentation

## 2012-11-01 DIAGNOSIS — F17200 Nicotine dependence, unspecified, uncomplicated: Secondary | ICD-10-CM | POA: Insufficient documentation

## 2012-11-01 DIAGNOSIS — Y929 Unspecified place or not applicable: Secondary | ICD-10-CM | POA: Insufficient documentation

## 2012-11-01 DIAGNOSIS — Z8742 Personal history of other diseases of the female genital tract: Secondary | ICD-10-CM | POA: Insufficient documentation

## 2012-11-01 DIAGNOSIS — S0501XA Injury of conjunctiva and corneal abrasion without foreign body, right eye, initial encounter: Secondary | ICD-10-CM

## 2012-11-01 DIAGNOSIS — Y998 Other external cause status: Secondary | ICD-10-CM | POA: Insufficient documentation

## 2012-11-01 DIAGNOSIS — F411 Generalized anxiety disorder: Secondary | ICD-10-CM | POA: Insufficient documentation

## 2012-11-01 MED ORDER — TOBRAMYCIN 0.3 % OP OINT
TOPICAL_OINTMENT | Freq: Three times a day (TID) | OPHTHALMIC | Status: DC
  Administered 2012-11-01 (×2): via OPHTHALMIC
  Filled 2012-11-01: qty 3.5

## 2012-11-01 MED ORDER — FLUORESCEIN SODIUM 1 MG OP STRP
1.0000 | ORAL_STRIP | Freq: Once | OPHTHALMIC | Status: AC
  Administered 2012-11-01: 1 via OPHTHALMIC
  Filled 2012-11-01: qty 1

## 2012-11-01 MED ORDER — TETRACAINE HCL 0.5 % OP SOLN
1.0000 [drp] | Freq: Once | OPHTHALMIC | Status: AC
  Administered 2012-11-01: 1 [drp] via OPHTHALMIC
  Filled 2012-11-01: qty 2

## 2012-11-01 MED ORDER — HYDROCODONE-ACETAMINOPHEN 5-325 MG PO TABS: 1.0000 | ORAL_TABLET | ORAL | Status: DC | PRN

## 2012-11-01 NOTE — ED Notes (Signed)
Duplicate documentation

## 2012-11-01 NOTE — ED Notes (Signed)
Pt accidentally hit right eye with a piece of paper this am while at work. Right eye red, painful and tearing profusely.

## 2012-11-01 NOTE — ED Provider Notes (Signed)
History     CSN: [id number]  Arrival date & time 11/01/12  1350   First MD Initiated Contact with Patient 11/01/12 1408      No chief complaint on file.   (Consider location/radiation/quality/duration/timing/severity/associated sxs/prior treatment) Patient is a 27 y.o. female presenting with eye pain. The history is provided by the patient. No language interpreter was used.  Eye Pain This is a new problem. The current episode started today. Pertinent negatives include no fever. Associated symptoms comments: She scratched her right eye with a piece of paper earlier this morning and has had persistent eye pain with tearing since that time. .    Past Medical History  Diagnosis Date  . BV (bacterial vaginosis)   . Anxiety   . Preterm delivery   . Chlamydia   . Gonorrhea   . Trichomonas     Past Surgical History  Procedure Laterality Date  . Dilation and curettage of uterus      No family history on file.  History  Substance Use Topics  . Smoking status: Current Every Day Smoker -- 0.20 packs/day    Types: Cigarettes  . Smokeless tobacco: Never Used     Comment: 2 -3 cigs a day  . Alcohol Use: No    OB History   Grav Para Term Preterm Abortions TAB SAB Ect Mult Living   3 1  1 2 2    1       Review of Systems  Constitutional: Negative for fever.  Eyes: Positive for pain, discharge and redness.    Allergies  Review of patient's allergies indicates no known allergies.  Home Medications   Current Outpatient Rx  Name  Route  Sig  Dispense  Refill  . metroNIDAZOLE (FLAGYL) 500 MG tablet   Oral   Take 500 mg by mouth 2 (two) times daily.           BP 107/74  Pulse 68  Temp(Src) 98.5 F (36.9 C) (Oral)  SpO2 100%  Physical Exam  Constitutional: She appears well-developed and well-nourished. No distress.  Eyes:  Right eye injected, excessive tearing. No purulent drainage. PERRL, with photophobia, increased on accommodation. Fluorescein stain positive  for small central abrasion. No ulceration visualized.   Pulmonary/Chest: Effort normal.  Neurological: She is alert.    ED Course  Procedures (including critical care time)  Labs Reviewed - No data to display No results found.   No diagnosis found.  1. Corneal abrasion  MDM  Findings c/w corneal abrasion, question iritis with photophobia on accommodation.         Arnoldo Hooker, PA-C 11/01/12 1450

## 2012-11-01 NOTE — ED Provider Notes (Signed)
Medical screening examination/treatment/procedure(s) were performed by non-physician practitioner and as supervising physician I was immediately available for consultation/collaboration.  Aleksandar Duve R. Shayona Hibbitts, MD 11/01/12 1555 

## 2012-11-01 NOTE — Telephone Encounter (Signed)
error 

## 2012-11-02 ENCOUNTER — Emergency Department (HOSPITAL_COMMUNITY)
Admission: EM | Admit: 2012-11-02 | Discharge: 2012-11-02 | Disposition: A | Discharge status: Home / Self Care | Payer: BC Managed Care – PPO | Attending: Emergency Medicine | Admitting: Emergency Medicine

## 2012-11-02 ENCOUNTER — Encounter (HOSPITAL_COMMUNITY): Payer: Self-pay | Admitting: Emergency Medicine

## 2012-11-02 DIAGNOSIS — F17200 Nicotine dependence, unspecified, uncomplicated: Secondary | ICD-10-CM | POA: Insufficient documentation

## 2012-11-02 DIAGNOSIS — W268XXA Contact with other sharp object(s), not elsewhere classified, initial encounter: Secondary | ICD-10-CM | POA: Insufficient documentation

## 2012-11-02 DIAGNOSIS — Y9389 Activity, other specified: Secondary | ICD-10-CM | POA: Insufficient documentation

## 2012-11-02 DIAGNOSIS — Z8659 Personal history of other mental and behavioral disorders: Secondary | ICD-10-CM | POA: Insufficient documentation

## 2012-11-02 DIAGNOSIS — Z8619 Personal history of other infectious and parasitic diseases: Secondary | ICD-10-CM | POA: Insufficient documentation

## 2012-11-02 DIAGNOSIS — Y929 Unspecified place or not applicable: Secondary | ICD-10-CM | POA: Insufficient documentation

## 2012-11-02 DIAGNOSIS — S0501XD Injury of conjunctiva and corneal abrasion without foreign body, right eye, subsequent encounter: Secondary | ICD-10-CM

## 2012-11-02 DIAGNOSIS — Z8742 Personal history of other diseases of the female genital tract: Secondary | ICD-10-CM | POA: Insufficient documentation

## 2012-11-02 DIAGNOSIS — S058X9A Other injuries of unspecified eye and orbit, initial encounter: Secondary | ICD-10-CM | POA: Insufficient documentation

## 2012-11-02 MED ORDER — OXYCODONE-ACETAMINOPHEN 5-325 MG PO TABS
1.0000 | ORAL_TABLET | Freq: Once | ORAL | Status: AC
  Filled 2012-11-02: qty 1

## 2012-11-02 MED ORDER — FLUORESCEIN SODIUM 1 MG OP STRP
1.0000 | ORAL_STRIP | Freq: Once | OPHTHALMIC | Status: AC
  Administered 2012-11-02: 1 via OPHTHALMIC
  Filled 2012-11-02: qty 1

## 2012-11-02 MED ORDER — PROPARACAINE HCL 0.5 % OP SOLN
1.0000 [drp] | Freq: Once | OPHTHALMIC | Status: AC
  Administered 2012-11-02: 1 [drp] via OPHTHALMIC
  Filled 2012-11-02: qty 15

## 2012-11-02 NOTE — ED Provider Notes (Signed)
Medical screening examination/treatment/procedure(s) were performed by non-physician practitioner and as supervising physician I was immediately available for consultation/collaboration.   Naturi Alarid, MD 11/02/12 1454 

## 2012-11-02 NOTE — ED Notes (Signed)
Pt seen yesterday for same, states swelling and pain worse today in right eye, denies drainage at this time.

## 2012-11-02 NOTE — ED Provider Notes (Signed)
History     CSN: [id number]  Arrival date & time 11/02/12  0909   First MD Initiated Contact with Patient 11/02/12 [phone]      Chief Complaint  Patient presents with  . Eye Pain    (Consider location/radiation/quality/duration/timing/severity/associated sxs/prior treatment) HPI Comments: Patient presents with complaint of right eye pain, swelling that began yesterday morning after she cut her eye with a piece of paper. Patient was seen in emergency department yesterday and diagnosed with corneal abrasion. She was given ophthalmologic followup if not improved in 2 days. Patient has since worsened with increased pain and eyelid swelling. Pain is severe. She has photophobia. No nausea or vomiting. No fever. She has tearing from the right eye. She states that her eye is 'sensitive' and any irritation 'sets it off tearing'. She states vision is entirely blurred and can only see color out of R eye at baseline due to remote injury. Onset of symptoms acute. Course is worsening. Nothing makes symptoms better.  Patient is a 27 y.o. female presenting with eye pain. The history is provided by the patient.  Eye Pain This is a new problem. The current episode started yesterday. Pertinent negatives include no abdominal pain, chest pain, coughing, fever, headaches, myalgias, nausea, rash, sore throat or vomiting.    Past Medical History  Diagnosis Date  . BV (bacterial vaginosis)   . Anxiety   . Preterm delivery   . Chlamydia   . Gonorrhea   . Trichomonas     Past Surgical History  Procedure Laterality Date  . Dilation and curettage of uterus      History reviewed. No pertinent family history.  History  Substance Use Topics  . Smoking status: Current Every Day Smoker -- 0.20 packs/day    Types: Cigarettes  . Smokeless tobacco: Never Used     Comment: 2 -3 cigs a day  . Alcohol Use: No    OB History   Grav Para Term Preterm Abortions TAB SAB Ect Mult Living   3 1  1 2 2    1        Review of Systems  Constitutional: Negative for fever.  HENT: Negative for sore throat and rhinorrhea.   Eyes: Positive for photophobia, pain, discharge and redness. Negative for itching.  Respiratory: Negative for cough.   Cardiovascular: Negative for chest pain.  Gastrointestinal: Negative for nausea, vomiting, abdominal pain and diarrhea.  Genitourinary: Negative for dysuria.  Musculoskeletal: Negative for myalgias.  Skin: Negative for rash.  Neurological: Negative for headaches.    Allergies  Review of patient's allergies indicates no known allergies.  Home Medications   Current Outpatient Rx  Name  Route  Sig  Dispense  Refill  . HYDROcodone-acetaminophen (NORCO/VICODIN) 5-325 MG per tablet   Oral   Take 1 tablet by mouth every 4 (four) hours as needed for pain.   6 tablet   0   . metroNIDAZOLE (FLAGYL) 500 MG tablet   Oral   Take 500 mg by mouth 2 (two) times daily.           BP 115/81  Pulse 76  Temp(Src) 99.2 F (37.3 C) (Oral)  Resp 10  SpO2 100%  Physical Exam  Nursing note and vitals reviewed. Constitutional: She appears well-developed and well-nourished.  HENT:  Head: Normocephalic and atraumatic.  Eyes: Pupils are equal, round, and reactive to light. Right eye exhibits discharge. Right eye exhibits no chemosis. Left eye exhibits no chemosis and no discharge. Right conjunctiva is injected. Left  conjunctiva is not injected. Right eye exhibits normal extraocular motion. Left eye exhibits normal extraocular motion.  Slit lamp exam:      The right eye shows corneal abrasion and fluorescein uptake. The right eye shows no corneal flare, no corneal ulcer and no foreign body.  Neck: Normal range of motion. Neck supple.  Cardiovascular: Normal rate, regular rhythm and normal heart sounds.   Pulmonary/Chest: Effort normal and breath sounds normal.  Abdominal: Soft. There is no tenderness.  Neurological: She is alert.  Skin: Skin is warm and dry.   Psychiatric: She has a normal mood and affect.    ED Course  Procedures (including critical care time)  Labs Reviewed - No data to display No results found.   1. Corneal abrasion, right, subsequent encounter     9:27 AM Patient seen and examined. Note from yesterday reviewed. She had central corneal abrasion at that time. Question iritis. Work-up initiated. Medications ordered.   Vital signs reviewed and are as follows: Filed Vitals:   11/02/12 0920  BP: 115/81  Pulse: 76  Temp: 99.2 F (37.3 C)  Resp: 10   10:16 AM Two drops of proparacaine instilled into affected eye.   Fluorescein strip applied to affected eye. Wood's lamp used to assess for corneal abrasion. Small abrasion noted 6 o'clock, over pupil. Seidel neg.   Patient tolerated procedure well without immediate complication.   Patient was d/w Dr. Anitra Lauth. I offered to call ophtho to arrange appt with on-call provider, Dr. Luciana Axe. Patient states that she does not want to see him because she called the office yesterday and he was rude to her. I told her to call another eye specialist if she desires, however I cannot guarantee that she will be seen. Referral given for Dr. Luciana Axe.   Patient urged to return with worsening symptoms or other concerns. Patient verbalized understanding and agrees with plan.   Counseled to use previously prescribed medications (pain and eye medicine as directed).   MDM  Patient with corneal abrasion on exam in setting of a 'sensitive eye'. No foreign bodies noted. No surrounding erythema, swelling, vision changes suspicious for orbital or periorbital cellulitis. Note that patient has no clear vision in right eye due to previous dog bite injury sustained 'years ago'. No symptoms of retinal detachment. No ophthalmologic emergency suspected. Outpatient referral encouraged for complete exam.         Renne Crigler, PA-C 11/02/12 1153

## 2012-11-28 ENCOUNTER — Ambulatory Visit: Payer: BC Managed Care – PPO | Admitting: Family Medicine

## 2012-11-30 ENCOUNTER — Ambulatory Visit (INDEPENDENT_AMBULATORY_CARE_PROVIDER_SITE_OTHER): Payer: BC Managed Care – PPO | Admitting: Family Medicine

## 2012-11-30 VITALS — BP 116/74 | HR 76 | Temp 98.2°F | Ht 67.0 in | Wt 97.8 lb

## 2012-11-30 DIAGNOSIS — Z3046 Encounter for surveillance of implantable subdermal contraceptive: Secondary | ICD-10-CM

## 2012-11-30 NOTE — Progress Notes (Signed)
Patient ID: Courtney Adams, female   DOB: 1985/06/19, 27 y.o.   MRN: [id number] Subjective:   Patient is a 27 y.o. female scheduled for Nexplanon removal. Indications for procedure are patient's request birth control removal,Nexplanon does not make her feel well,she is losing weight.  Nexplanon was placed by her PMD 10/31/12 Discussed Blood/Blood Products: no  Patient Active Problem List   Diagnosis Date Noted  . Bacterial vaginosis 08/24/2012  . Mood disorder 06/19/2012  . Substance abuse 06/19/2012  . Tobacco abuse 06/19/2012  . Contraception management 06/19/2012  . Health care maintenance 06/19/2012   Past Medical History  Diagnosis Date  . BV (bacterial vaginosis)   . Anxiety   . Preterm delivery   . Chlamydia   . Gonorrhea   . Trichomonas     Past Surgical History  Procedure Laterality Date  . Dilation and curettage of uterus       (Not in a hospital admission) No Known Allergies  History  Substance Use Topics  . Smoking status: Current Every Day Smoker -- 0.20 packs/day    Types: Cigarettes  . Smokeless tobacco: Never Used     Comment: 2 -3 cigs a day  . Alcohol Use: No    History reviewed. No pertinent family history.  Review of Systems Pertinent items are noted in HPI.  Objective:   Filed Vitals:   11/30/12 0841  BP: 116/74  Pulse: 76  Temp: 98.2 F (36.8 C)  TempSrc: Oral  Height: 5\' 7"  (1.702 m)  Weight: 97 lb 12.8 oz (44.362 kg)    @IPVITALS [4@ e Physical Exam  Nursing note and vitals reviewed. Constitutional: She appears well-developed. No distress.  Cardiovascular: Normal rate, regular rhythm and normal heart sounds.  Exam reveals no friction rub.   No murmur heard. Pulmonary/Chest: Effort normal and breath sounds normal. No respiratory distress. She has no wheezes.  Musculoskeletal:       Left upper arm: Normal.       Arms:      Assessment/Plan:   Nexplanon removal

## 2012-11-30 NOTE — Patient Instructions (Addendum)
Wound Care Wound care helps prevent pain and infection.  You may need a tetanus shot if:  You cannot remember when you had your last tetanus shot.  You have never had a tetanus shot.  The injury broke your skin. If you need a tetanus shot and you choose not to have one, you may get tetanus. Sickness from tetanus can be serious. HOME CARE   Only take medicine as told by your doctor.  Clean the wound daily with mild soap and water.  Change any bandages (dressings) as told by your doctor.  Put medicated cream and a bandage on the wound as told by your doctor.  Change the bandage if it gets wet, dirty, or starts to smell.  Take showers. Do not take baths, swim, or do anything that puts your wound under water.  Rest and raise (elevate) the wound until the pain and puffiness (swelling) are better.  Keep all doctor visits as told. GET HELP RIGHT AWAY IF:   Yellowish-white fluid (pus) comes from the wound.  Medicine does not lessen your pain.  There is a red streak going away from the wound.  You have a fever. MAKE SURE YOU:   Understand these instructions.  Will watch your condition.  Will get help right away if you are not doing well or get worse. Document Released: 03/09/2008 Document Revised: 08/23/2011 Document Reviewed: 10/04/2010 [employer] Patient Information 2014 [employer].  

## 2012-11-30 NOTE — Assessment & Plan Note (Signed)
The patient's  Left arm was palpated and the implant device located. The area was prepped with Betadine . The distal end of the device was palpated and 2 cc of 1% lidocaine without epinephrine was injected. A 1 mm incision was made. Any fibrotic tissue was carefully dissected away using a sharp dissection. The device was removed in an intact manner. Dermabond and Steri-strips and a sterile dressing were applied to the incision. The patient tolerated the procedure well.  First Assistant: Dr Evelena Peat Second assistant Jazmin  New contraceptive method:Patient declined counseling done,she will return to discuss birth control with her PMP,she is advised to use condom in the interim.

## 2012-12-02 ENCOUNTER — Emergency Department (HOSPITAL_COMMUNITY)
Admission: EM | Admit: 2012-12-02 | Discharge: 2012-12-02 | Disposition: A | Discharge status: Home / Self Care | Payer: BC Managed Care – PPO | Attending: Emergency Medicine | Admitting: Emergency Medicine

## 2012-12-02 ENCOUNTER — Encounter (HOSPITAL_COMMUNITY): Payer: Self-pay | Admitting: *Deleted

## 2012-12-02 ENCOUNTER — Emergency Department (INDEPENDENT_AMBULATORY_CARE_PROVIDER_SITE_OTHER)
Admission: EM | Admit: 2012-12-02 | Discharge: 2012-12-02 | Disposition: A | Discharge status: Nursing Home (Includes ICF) | Payer: BC Managed Care – PPO | Source: Home / Self Care | Attending: Family Medicine | Admitting: Family Medicine

## 2012-12-02 ENCOUNTER — Telehealth: Payer: Self-pay | Admitting: Family Medicine

## 2012-12-02 DIAGNOSIS — F17200 Nicotine dependence, unspecified, uncomplicated: Secondary | ICD-10-CM | POA: Insufficient documentation

## 2012-12-02 DIAGNOSIS — R634 Abnormal weight loss: Secondary | ICD-10-CM

## 2012-12-02 DIAGNOSIS — Z8659 Personal history of other mental and behavioral disorders: Secondary | ICD-10-CM | POA: Insufficient documentation

## 2012-12-02 DIAGNOSIS — R531 Weakness: Secondary | ICD-10-CM

## 2012-12-02 DIAGNOSIS — Z792 Long term (current) use of antibiotics: Secondary | ICD-10-CM | POA: Insufficient documentation

## 2012-12-02 DIAGNOSIS — N760 Acute vaginitis: Secondary | ICD-10-CM | POA: Insufficient documentation

## 2012-12-02 DIAGNOSIS — Z8619 Personal history of other infectious and parasitic diseases: Secondary | ICD-10-CM | POA: Insufficient documentation

## 2012-12-02 DIAGNOSIS — Z8742 Personal history of other diseases of the female genital tract: Secondary | ICD-10-CM | POA: Insufficient documentation

## 2012-12-02 DIAGNOSIS — R5381 Other malaise: Secondary | ICD-10-CM | POA: Insufficient documentation

## 2012-12-02 DIAGNOSIS — Z3202 Encounter for pregnancy test, result negative: Secondary | ICD-10-CM | POA: Insufficient documentation

## 2012-12-02 DIAGNOSIS — A599 Trichomoniasis, unspecified: Secondary | ICD-10-CM | POA: Insufficient documentation

## 2012-12-02 LAB — URINALYSIS, ROUTINE W REFLEX MICROSCOPIC
Bilirubin Urine: NEGATIVE
Glucose, UA: NEGATIVE mg/dL
Ketones, ur: 15 mg/dL — AB
Leukocytes, UA: NEGATIVE
Nitrite: NEGATIVE
Protein, ur: 30 mg/dL — AB
Specific Gravity, Urine: 1.029 (ref 1.005–1.030)
Urobilinogen, UA: 1 mg/dL (ref 0.0–1.0)
pH: 6 (ref 5.0–8.0)

## 2012-12-02 LAB — COMPREHENSIVE METABOLIC PANEL
ALT: 11 U/L (ref 0–35)
AST: 14 U/L (ref 0–37)
Albumin: 3.6 g/dL (ref 3.5–5.2)
Alkaline Phosphatase: 73 U/L (ref 39–117)
BUN: 8 mg/dL (ref 6–23)
CO2: 28 mEq/L (ref 19–32)
Calcium: 9.2 mg/dL (ref 8.4–10.5)
Chloride: 98 mEq/L (ref 96–112)
Creatinine, Ser: 0.75 mg/dL (ref 0.50–1.10)
GFR calc Af Amer: >90 mL/min (ref >90)
GFR calc non Af Amer: >90 mL/min (ref >90)
Glucose, Bld: 89 mg/dL (ref 70–99)
Potassium: 3.2 mEq/L — ABNORMAL LOW (ref 3.5–5.1)
Sodium: 136 mEq/L (ref 135–145)
Total Bilirubin: 0.5 mg/dL (ref 0.3–1.2)
Total Protein: 7.7 g/dL (ref 6.0–8.3)

## 2012-12-02 LAB — CBC WITH DIFFERENTIAL/PLATELET
Basophils Absolute: 0 10*3/uL (ref 0.0–0.1)
Basophils Relative: 0 % (ref 0–1)
Eosinophils Absolute: 0 10*3/uL (ref 0.0–0.7)
Eosinophils Relative: 1 % (ref 0–5)
HCT: 37.4 % (ref 36.0–46.0)
Hemoglobin: 12.7 g/dL (ref 12.0–15.0)
Lymphocytes Relative: 25 % (ref 12–46)
Lymphs Abs: 1.4 10*3/uL (ref 0.7–4.0)
MCH: 32.2 pg (ref 26.0–34.0)
MCHC: 34 g/dL (ref 30.0–36.0)
MCV: 94.7 fL (ref 78.0–100.0)
Monocytes Absolute: 0.4 10*3/uL (ref 0.1–1.0)
Monocytes Relative: 8 % (ref 3–12)
Neutro Abs: 3.7 10*3/uL (ref 1.7–7.7)
Neutrophils Relative %: 67 % (ref 43–77)
Platelets: 190 10*3/uL (ref 150–400)
RBC: 3.95 MIL/uL (ref 3.87–5.11)
RDW: 12.3 % (ref 11.5–15.5)
WBC: 5.5 10*3/uL (ref 4.0–10.5)

## 2012-12-02 LAB — PREGNANCY, URINE: Preg Test, Ur: NEGATIVE

## 2012-12-02 LAB — URINE MICROSCOPIC-ADD ON

## 2012-12-02 MED ORDER — SODIUM CHLORIDE 0.9 % IV BOLUS (SEPSIS)
1000.0000 mL | Freq: Once | INTRAVENOUS | Status: AC
  Administered 2012-12-02: 1000 mL via INTRAVENOUS

## 2012-12-02 NOTE — ED Notes (Signed)
Pt sent here from [facility]. Reports fatigue and generalized weakness x 1 week, had 10 lb weight loss in 2 weeks and has a sore throat. Airway intact, no acute distress noted at triage.

## 2012-12-02 NOTE — ED Provider Notes (Signed)
History     CSN: [id number]  Arrival date & time 12/02/12  1101   First MD Initiated Contact with Patient 12/02/12 1108      Chief Complaint  Patient presents with  . Weakness    (Consider location/radiation/quality/duration/timing/severity/associated sxs/prior treatment) Patient is a 27 y.o. female presenting with weakness. The history is provided by the patient.  Weakness This is a new problem. The current episode started more than 1 week ago (unexplained 10 lb weight loss in 2 wks.). The problem has been gradually worsening. Associated symptoms include abdominal pain.    Past Medical History  Diagnosis Date  . BV (bacterial vaginosis)   . Anxiety   . Preterm delivery   . Chlamydia   . Gonorrhea   . Trichomonas     Past Surgical History  Procedure Laterality Date  . Dilation and curettage of uterus      No family history on file.  History  Substance Use Topics  . Smoking status: Current Every Day Smoker -- 0.20 packs/day    Types: Cigarettes  . Smokeless tobacco: Never Used     Comment: 2 -3 cigs a day  . Alcohol Use: No    OB History   Grav Para Term Preterm Abortions TAB SAB Ect Mult Living   3 1  1 2 2    1       Review of Systems  Constitutional: Positive for appetite change and unexpected weight change.  HENT: Positive for sore throat.   Gastrointestinal: Positive for abdominal pain. Negative for nausea, vomiting, diarrhea and constipation.  Genitourinary: Positive for vaginal bleeding.  Neurological: Positive for weakness.    Allergies  Review of patient's allergies indicates no known allergies.  Home Medications   Current Outpatient Rx  Name  Route  Sig  Dispense  Refill  . HYDROcodone-acetaminophen (NORCO/VICODIN) 5-325 MG per tablet   Oral   Take 1 tablet by mouth every 4 (four) hours as needed for pain.   6 tablet   0   . metroNIDAZOLE (FLAGYL) 500 MG tablet   Oral   Take 500 mg by mouth 2 (two) times daily.           BP  115/77  Pulse 84  Temp(Src) 98.4 F (36.9 C) (Oral)  Resp 16  SpO2 100%  LMP 12/02/2012  Physical Exam  Nursing note and vitals reviewed. Constitutional: She is oriented to person, place, and time. She appears cachectic. She is cooperative.  Eyes: Pupils are equal, round, and reactive to light.  Neck: Normal range of motion. Neck supple.  Cardiovascular: Normal rate, regular rhythm, normal heart sounds and intact distal pulses.   Pulmonary/Chest: Effort normal and breath sounds normal.  Abdominal: Soft. Bowel sounds are normal. She exhibits no mass. There is tenderness. There is no rebound and no guarding.  Lymphadenopathy:    She has no cervical adenopathy.  Neurological: She is alert and oriented to person, place, and time.  Skin: Skin is warm and dry.    ED Course  Procedures (including critical care time)  Labs Reviewed - No data to display No results found.   1. Unexplained weight loss       MDM  Sent for eval of 10 pd weight loss in 2 wks with st, abd pain , mult etiol, r/o hiv.        Linna Hoff, MD 12/02/12 [phone]

## 2012-12-02 NOTE — ED Notes (Addendum)
Pt  Reports  Symptoms  Of  Weakness     -  sorethroat   Body  Aches   And  10  Pound  Weight  Loss  Over  sev  Weeks        Had  Implant  Removed  yes2  Days  Ago      - Denys  Any      Vomiting  Diarrhea            Pt  Also  Reports   Diffuse   abd  Pain  As  Well

## 2012-12-02 NOTE — Telephone Encounter (Signed)
Patient calling the emergency line seeing if she could be seen at clinic today. She has a sore throat x5 days, noticed white discharge in the bag of her throat, having chills.  I told her that we are not open on the weekend but that she can go to urgent care if she is not feeling well. Otherwise, she can call [facility] on Monday morning for a same day appointment.  She expressed understanding and agreed to plan.  Marena Chancy, PGY-2 Family Medicine Resident

## 2012-12-02 NOTE — ED Provider Notes (Signed)
History     CSN: [id number]  Arrival date & time 12/02/12  1154   First MD Initiated Contact with Patient 12/02/12 1205      Chief Complaint  Patient presents with  . Fatigue  . Sore Throat    (Consider location/radiation/quality/duration/timing/severity/associated sxs/prior treatment) Patient is a 27 y.o. female presenting with pharyngitis.  Sore Throat   Pt presents to the ED complaining of generalized weakness for the last 5-7 days. She has felt hot and cold at times but no measured fever. She reports 10lb weight loss over the last 2 weeks but denies any N/V/D, dysuria or hematuria. She thought her symptoms were due to Nexplanon that was placed about a month ago and so she had it removed 2 days ago at PCP office. She reports some occasional vaginal bleeding since Nexplanon placed but denies any discharge or pelvic pain. She has mild aching pain in RUQ. She was seen at [facility] and sent to the ED for evaluation of unexplained weight loss, abd pain and 'r/o HIV'. Pt also reports mild sore throat and 'white spots' on her tonsils.  Past Medical History  Diagnosis Date  . BV (bacterial vaginosis)   . Anxiety   . Preterm delivery   . Chlamydia   . Gonorrhea   . Trichomonas     Past Surgical History  Procedure Laterality Date  . Dilation and curettage of uterus      History reviewed. No pertinent family history.  History  Substance Use Topics  . Smoking status: Current Every Day Smoker -- 0.20 packs/day    Types: Cigarettes  . Smokeless tobacco: Never Used     Comment: 2 -3 cigs a day  . Alcohol Use: No    OB History   Grav Para Term Preterm Abortions TAB SAB Ect Mult Living   3 1  1 2 2    1       Review of Systems All other systems reviewed and are negative except as noted in HPI.   Allergies  Review of patient's allergies indicates no known allergies.  Home Medications   Current Outpatient Rx  Name  Route  Sig  Dispense  Refill  . metroNIDAZOLE (FLAGYL) 500 MG  tablet   Oral   Take 500 mg by mouth 2 (two) times daily.           BP 123/75  Pulse 79  Temp(Src) 98.6 F (37 C)  Resp 16  Wt 97 lb 1.6 oz (44.044 kg)  BMI 15.2 kg/m2  SpO2 100%  LMP 12/02/2012  Physical Exam  Nursing note and vitals reviewed. Constitutional: She is oriented to person, place, and time. She appears well-developed.  Thin, but not cachectic  HENT:  Head: Normocephalic and atraumatic.  Tonsilliths, otherwise normal  Eyes: EOM are normal. Pupils are equal, round, and reactive to light.  Neck: Normal range of motion. Neck supple.  Cardiovascular: Normal rate, normal heart sounds and intact distal pulses.   Pulmonary/Chest: Effort normal and breath sounds normal.  Abdominal: Bowel sounds are normal. She exhibits no distension. There is tenderness (mild, RUQ). There is no rebound and no guarding.  Musculoskeletal: Normal range of motion. She exhibits no edema and no tenderness.  Neurological: She is alert and oriented to person, place, and time. She has normal strength. No cranial nerve deficit or sensory deficit.  Skin: Skin is warm and dry. No rash noted.  Psychiatric: She has a normal mood and affect.    ED Course  Procedures (  including critical care time)  Labs Reviewed  CBC WITH DIFFERENTIAL  COMPREHENSIVE METABOLIC PANEL  URINALYSIS, ROUTINE W REFLEX MICROSCOPIC  PREGNANCY, URINE   No results found.   1. General weakness   2. Weight loss       MDM  Pt has had HIV and Thyroid function checked at PCP office in the last 2 months. No indication for repeating now. Review of the available electronic medical record shows no significant weight loss since her office visit 5/22. She has had about 10lb weight loss since January though, may be associated with suspected depression.    1:46 PM Labs unremarkable. Pt sleeping comfortably in bed. Recommend close PCP follow up for further evaluation of her symptoms. No indication for admission or further ED  workup today.      Cambry Spampinato B. Bernette Mayers, MD 12/02/12 1347

## 2013-02-04 ENCOUNTER — Ambulatory Visit (INDEPENDENT_AMBULATORY_CARE_PROVIDER_SITE_OTHER): Payer: BC Managed Care – PPO | Admitting: Family Medicine

## 2013-02-04 VITALS — BP 108/69 | HR 62 | Temp 97.8°F | Resp 16 | Ht 67.0 in | Wt 104.0 lb

## 2013-02-04 DIAGNOSIS — Z9189 Other specified personal risk factors, not elsewhere classified: Secondary | ICD-10-CM

## 2013-02-04 DIAGNOSIS — Z202 Contact with and (suspected) exposure to infections with a predominantly sexual mode of transmission: Secondary | ICD-10-CM

## 2013-02-04 DIAGNOSIS — B9689 Other specified bacterial agents as the cause of diseases classified elsewhere: Secondary | ICD-10-CM

## 2013-02-04 DIAGNOSIS — N760 Acute vaginitis: Secondary | ICD-10-CM

## 2013-02-04 LAB — POCT WET PREP WITH KOH
Epithelial Wet Prep HPF POC: NEGATIVE
KOH Prep POC: NEGATIVE
RBC Wet Prep HPF POC: NEGATIVE
Trichomonas, UA: NEGATIVE
WBC Wet Prep HPF POC: NEGATIVE
Yeast Wet Prep HPF POC: NEGATIVE

## 2013-02-04 LAB — POCT URINE PREGNANCY: Preg Test, Ur: NEGATIVE

## 2013-02-04 MED ORDER — FLUCONAZOLE 150 MG PO TABS: 150.0000 mg | ORAL_TABLET | Freq: Once | ORAL | Status: DC

## 2013-02-04 MED ORDER — METRONIDAZOLE 0.75 % VA GEL: 1.0000 | Freq: Every day | VAGINAL | Status: AC

## 2013-02-04 NOTE — Progress Notes (Signed)
Subjective:    Patient ID: Courtney Adams, female    DOB: 1986/03/21, 27 y.o.   MRN: [id number] Chief Complaint  Patient presents with  . Vaginal Discharge    x dys no odor, color- beige; Hx of BV   HPI  Discharge for 2d, No odor like her typical BV so unsure what it is.  No itching or pain, normal urination, no dysuria. Is sexually active - just had nexplanon removed. Periods regular.  Cheated on her boyfriend so would like std testing, occ using condoms so would like pregnancy test. Plans to f/u w/ her PCP for birth control. Past Medical History  Diagnosis Date  . BV (bacterial vaginosis)   . Anxiety   . Preterm delivery   . Chlamydia   . Gonorrhea   . Trichomonas    No current outpatient prescriptions on file prior to visit.   No current facility-administered medications on file prior to visit.   No Known Allergies   Review of Systems  Constitutional: Negative for fever, chills, diaphoresis, activity change, appetite change, fatigue and unexpected weight change.  Gastrointestinal: Negative for abdominal pain, diarrhea, constipation, blood in stool, anal bleeding and rectal pain.  Genitourinary: Positive for vaginal discharge. Negative for dysuria, urgency, frequency, hematuria, decreased urine volume, vaginal bleeding, difficulty urinating, genital sores, vaginal pain, menstrual problem, pelvic pain and dyspareunia.  Musculoskeletal: Negative for gait problem.  Skin: Negative for rash.  Hematological: Negative for adenopathy.  Psychiatric/Behavioral: The patient is not nervous/anxious.       BP 108/69  Pulse 62  Temp(Src) 97.8 F (36.6 C) (Oral)  Resp 16  Ht 5\' 7"  (1.702 m)  Wt 104 lb (47.174 kg)  BMI 16.28 kg/m2  SpO2 100%  LMP 01/26/2013 Objective:   Physical Exam  Constitutional: She is oriented to person, place, and time. She appears well-developed and well-nourished. No distress.  HENT:  Head: Normocephalic and atraumatic.  Right Ear: External ear normal.   Eyes: Conjunctivae are normal. No scleral icterus.  Pulmonary/Chest: Effort normal.  Genitourinary: Cervix exhibits discharge. There is a foreign body around the vagina. Vaginal discharge found.  Piece of tan latex high in vaginal vault - suspect  piece of condom approx 1 in x 3 in Copious amount of thick white-yellow vag discharge with odor  Neurological: She is alert and oriented to person, place, and time.  Skin: Skin is warm and dry. She is not diaphoretic. No erythema.  Psychiatric: She has a normal mood and affect. Her behavior is normal.      Results for orders placed in visit on 02/04/13  POCT WET PREP WITH KOH      Result Value Range   Trichomonas, UA Negative     Clue Cells Wet Prep HPF POC TNTC     Epithelial Wet Prep HPF POC NEG     Yeast Wet Prep HPF POC NEG     Bacteria Wet Prep HPF POC 4+     RBC Wet Prep HPF POC NEG     WBC Wet Prep HPF POC NEG     KOH Prep POC Negative    POCT URINE PREGNANCY      Result Value Range   Preg Test, Ur Negative      Assessment & Plan:  Bacterial vaginosis - Plan: POCT Wet Prep with KOH, POCT urine pregnancy, HIV antibody, GC/Chlamydia Probe Amp, RPR, Hepatitis C antibody  Possible exposure to STD  Meds ordered this encounter  Medications  . metroNIDAZOLE (METROGEL VAGINAL) 0.75 %  vaginal gel    Sig: Place 1 Applicatorful vaginally at bedtime.    Dispense:  70 g    Refill:  0  . fluconazole (DIFLUCAN) 150 MG tablet    Sig: Take 1 tablet (150 mg total) by mouth once.    Dispense:  1 tablet    Refill:  0

## 2013-02-04 NOTE — Patient Instructions (Addendum)
Bacterial Vaginosis Bacterial vaginosis (BV) is a vaginal infection where the normal balance of bacteria in the vagina is disrupted. The normal balance is then replaced by an overgrowth of certain bacteria. There are several different kinds of bacteria that can cause BV. BV is the most common vaginal infection in women of childbearing age. CAUSES   The cause of BV is not fully understood. BV develops when there is an increase or imbalance of harmful bacteria.  Some activities or behaviors can upset the normal balance of bacteria in the vagina and put women at increased risk including:  Having a new sex partner or multiple sex partners.  Douching.  Using an intrauterine device (IUD) for contraception.  It is not clear what role sexual activity plays in the development of BV. However, women that have never had sexual intercourse are rarely infected with BV. Women do not get BV from toilet seats, bedding, swimming pools or from touching objects around them.  SYMPTOMS   Grey vaginal discharge.  A fish-like odor with discharge, especially after sexual intercourse.  Itching or burning of the vagina and vulva.  Burning or pain with urination.  Some women have no signs or symptoms at all. DIAGNOSIS  Your caregiver must examine the vagina for signs of BV. Your caregiver will perform lab tests and look at the sample of vaginal fluid through a microscope. They will look for bacteria and abnormal cells (clue cells), a pH test higher than 4.5, and a positive amine test all associated with BV.  RISKS AND COMPLICATIONS   Pelvic inflammatory disease (PID).  Infections following gynecology surgery.  Developing HIV.  Developing herpes virus. TREATMENT  Sometimes BV will clear up without treatment. However, all women with symptoms of BV should be treated to avoid complications, especially if gynecology surgery is planned. Female partners generally do not need to be treated. However, BV may spread  between female sex partners so treatment is helpful in preventing a recurrence of BV.   BV may be treated with antibiotics. The antibiotics come in either pill or vaginal cream forms. Either can be used with nonpregnant or pregnant women, but the recommended dosages differ. These antibiotics are not harmful to the baby.  BV can recur after treatment. If this happens, a second round of antibiotics will often be prescribed.  Treatment is important for pregnant women. If not treated, BV can cause a premature delivery, especially for a pregnant woman who had a premature birth in the past. All pregnant women who have symptoms of BV should be checked and treated.  For chronic reoccurrence of BV, treatment with a type of prescribed gel vaginally twice a week is helpful. HOME CARE INSTRUCTIONS   Finish all medication as directed by your caregiver.  Do not have sex until treatment is completed.  Tell your sexual partner that you have a vaginal infection. They should see their caregiver and be treated if they have problems, such as a mild rash or itching.  Practice safe sex. Use condoms. Only have 1 sex partner. PREVENTION  Basic prevention steps can help reduce the risk of upsetting the natural balance of bacteria in the vagina and developing BV:  Do not have sexual intercourse (be abstinent).  Do not douche.  Use all of the medicine prescribed for treatment of BV, even if the signs and symptoms go away.  Tell your sex partner if you have BV. That way, they can be treated, if needed, to prevent reoccurrence. SEEK MEDICAL CARE IF:     Your symptoms are not improving after 3 days of treatment.  You have increased discharge, pain, or fever. MAKE SURE YOU:   Understand these instructions.  Will watch your condition.  Will get help right away if you are not doing well or get worse. FOR MORE INFORMATION  Division of STD Prevention (DSTDP), Centers for Disease Control and Prevention:  www.cdc.gov/std American Social Health Association (ASHA): www.ashastd.org  Document Released: 05/31/2005 Document Revised: 08/23/2011 Document Reviewed: 11/21/2008 Exit[employer] Patient Information 2014 [employer].  

## 2013-02-05 LAB — HIV ANTIBODY (ROUTINE TESTING W REFLEX): HIV: NONREACTIVE

## 2013-02-05 LAB — RPR

## 2013-02-05 LAB — HEPATITIS C ANTIBODY: HCV Ab: NEGATIVE

## 2013-02-06 ENCOUNTER — Telehealth: Payer: Self-pay

## 2013-02-06 ENCOUNTER — Encounter: Payer: Self-pay | Admitting: Family Medicine

## 2013-02-06 LAB — GC/CHLAMYDIA PROBE AMP
CT Probe RNA: NEGATIVE
GC Probe RNA: NEGATIVE

## 2013-02-06 MED ORDER — METRONIDAZOLE 500 MG PO TABS: 500.0000 mg | ORAL_TABLET | Freq: Two times a day (BID) | ORAL | Status: DC

## 2013-02-06 NOTE — Telephone Encounter (Signed)
Rx sent to pharmacy. Please advise patient that she cannot drink alcohol while taking this medication

## 2013-02-06 NOTE — Telephone Encounter (Signed)
PT STATES SHE WAS GIVEN A GEL OF METRORIDAZOLE AND IT IS MORE EXPENSIVE, WOULD LIKE TO HAVE IT IN A PILL FORM INSTEAD PLEASE CALL [phone]   [facility] ON [address]

## 2013-02-06 NOTE — Telephone Encounter (Signed)
Pended please advise.  

## 2013-02-07 ENCOUNTER — Telehealth: Payer: Self-pay

## 2013-02-07 NOTE — Telephone Encounter (Signed)
Patient states that she would like to have the pills sent to her pharmacy and not the gel. The pills are less expensive for her.  Pharmacy is [facility] [address]. [phone].

## 2013-02-07 NOTE — Telephone Encounter (Signed)
They were sent yesterday, but was late, called her to advise. She is advised to avoid alcohol, she states she does not ever drink alcohol

## 2013-02-07 NOTE — Telephone Encounter (Signed)
Pt called several times yesterday and has called this morning she states that she is wanting the pill instead of the gel because the gel is to expensive. She wants someone to call her back soon, because she went to pharmacy after someone said that they pills were at the pharmacy but they are not Call back number is [phone] Pharmacy [facility] on [address]

## 2013-03-28 ENCOUNTER — Encounter: Payer: Self-pay | Admitting: Family Medicine

## 2013-03-28 ENCOUNTER — Ambulatory Visit (INDEPENDENT_AMBULATORY_CARE_PROVIDER_SITE_OTHER): Payer: BC Managed Care – PPO | Admitting: Family Medicine

## 2013-03-28 VITALS — BP 110/65 | HR 80 | Temp 99.0°F | Ht 67.0 in | Wt 104.0 lb

## 2013-03-28 DIAGNOSIS — L0292 Furuncle, unspecified: Secondary | ICD-10-CM

## 2013-03-28 MED ORDER — BACITRACIN 500 UNIT/GM EX OINT: 1.0000 "application " | TOPICAL_OINTMENT | Freq: Two times a day (BID) | CUTANEOUS | Status: DC

## 2013-03-28 NOTE — Patient Instructions (Addendum)
The rash you have in your gluteal and perineal area is likely to be secondary to shaving so avoid shaving in the area. Apply ointment 2 times a day on the lesions have resolved. Followup as needed.

## 2013-03-28 NOTE — Assessment & Plan Note (Signed)
Resident gluteal area most likely secondary to shaving. Topical antibiotic prescribed Recommendations about avoiding shaving Follow up as needed.

## 2013-03-28 NOTE — Addendum Note (Signed)
Addended by: Farrell Ours on: 03/28/2013 03:43 PM   Modules accepted: Orders, Medications

## 2013-03-28 NOTE — Progress Notes (Signed)
Family Medicine Office Visit Note   Subjective:   Patient ID: Courtney Adams, female  DOB: 24-Jul-1985, 27 y.o.. MRN: [id number]   Pt that comes today or same day appointment complaining of rash on her perineal and gluteal area. Patient reports she has noticed this after every period and relates this rash to the use of pads for her menses. She also reports shaving in the area before her periods. She reports the rash sometimes is itchy and sometimes hurts. Patient is not concerned about sexual transmitted disease at this time and she denies history of herpes. Denies vaginal discharge.   Review of Systems:  Per HPI  Objective:   Physical Exam: Gen:  NAD Perineal: 3 furunculus present on upper and lower gluteal area bilaterally. Very localized papular lesions with mild erythema. No surrounded erythema or edema. No open lesions or vesicles.  EXT: No edema  Assessment & Plan:

## 2013-05-04 ENCOUNTER — Ambulatory Visit (INDEPENDENT_AMBULATORY_CARE_PROVIDER_SITE_OTHER): Payer: Self-pay | Admitting: Family Medicine

## 2013-05-04 ENCOUNTER — Encounter: Payer: Self-pay | Admitting: Family Medicine

## 2013-05-04 VITALS — BP 117/74 | HR 80 | Temp 98.3°F | Ht 67.0 in | Wt 106.0 lb

## 2013-05-04 DIAGNOSIS — M545 Low back pain, unspecified: Secondary | ICD-10-CM

## 2013-05-04 DIAGNOSIS — M546 Pain in thoracic spine: Secondary | ICD-10-CM

## 2013-05-04 DIAGNOSIS — R197 Diarrhea, unspecified: Secondary | ICD-10-CM

## 2013-05-04 MED ORDER — IBUPROFEN 600 MG PO TABS: 600.0000 mg | ORAL_TABLET | Freq: Four times a day (QID) | ORAL | Status: DC | PRN

## 2013-05-04 NOTE — Patient Instructions (Signed)

## 2013-05-04 NOTE — Assessment & Plan Note (Signed)
Likely viral gastro, son recently with similar symptoms, improving today, exam benign - rec increased fluid intake (water, gatorade, etc) for the next few days, good hand hygeine

## 2013-05-04 NOTE — Assessment & Plan Note (Signed)
Strain of paraspinal muscles, no red flags - treat with rest, ice, ibuprofen - gave note for work, no heavy lifting for at least 1 week - return for persistent pain, worsening, incontinence, fevers, etc

## 2013-05-04 NOTE — Progress Notes (Addendum)
  Subjective:    Patient ID: Courtney Adams, female    DOB: [birth year], 27 y.o.   MRN: [id number]  Emesis  Associated symptoms include diarrhea. Pertinent negatives include no abdominal pain, arthralgias or chest pain.  Diarrhea  Associated symptoms include vomiting. Pertinent negatives include no abdominal pain or arthralgias.  Back Pain Pertinent negatives include no abdominal pain, chest pain or weakness.   BACK PAIN  Location: lumbar/thoracic  Quality: shooting/sharp  Onset: 1 week ago  Worse with: at night/in bed  Better with: nothing  Radiation: up to neck Trauma: heavy lifting at work recently [employer] sitting/standing/leaning forward: standing  Red Flags Fecal/urinary incontinence: some mild urinary incontinence that was present for a long time prior to pain onset and has not gotten worse Numbness/Weakness: no  Fever/chills/sweats: no  Night pain: yes  Unexplained weight loss: no  No relief with bedrest: no  h/o cancer/immunosuppression: no  IV drug use: no  PMH of osteoporosis or chronic steroid use: no   DIARRHEA  Onset: yesterday  Time course: improving today  Description: watery   Number of stools per day: 4-5  Previous treatment: no  Vomiting: yes  Abdominal Pain: no  Weight loss: no  Decreased urine output: no  Lightheadedness: no  Recent travel history: no    Sick contacts: yes    Suspicious food or water: no  Change in diet: no    Red Flags Fever: no  Bloody stools: no  Recent antibiotics: no  Immuncompromised: no     Review of Systems  Cardiovascular: Negative for chest pain.  Gastrointestinal: Positive for nausea, vomiting and diarrhea. Negative for abdominal pain and blood in stool.  Genitourinary: Negative for urgency, frequency, decreased urine volume, enuresis and difficulty urinating.  Musculoskeletal: Positive for back pain and neck pain. Negative for arthralgias, gait problem, joint swelling and neck stiffness.  Neurological: Negative  for weakness.  All other systems reviewed and are negative.       Objective:   Physical Exam  Nursing note and vitals reviewed. Constitutional: She appears well-developed and well-nourished. No distress.  HENT:  Head: Normocephalic and atraumatic.  Right Ear: External ear normal.  Left Ear: External ear normal.  Nose: Nose normal.  Eyes: Conjunctivae and EOM are normal. Right eye exhibits no discharge. Left eye exhibits no discharge. No scleral icterus.  Neck: Normal range of motion. Neck supple.  Pulmonary/Chest: Effort normal.  Abdominal: Soft. Bowel sounds are normal. She exhibits no distension and no mass. There is no tenderness. There is no rebound and no guarding.  Musculoskeletal: She exhibits tenderness. She exhibits no edema.       Lumbar back: She exhibits tenderness, pain and spasm. She exhibits normal range of motion, no bony tenderness, no swelling, no edema, no deformity, no laceration and normal pulse.       Back:  Skin: Skin is warm and dry. No rash noted. She is not diaphoretic. No pallor.  Psychiatric: She has a normal mood and affect. Her behavior is normal.          Assessment & Plan:

## 2013-05-07 ENCOUNTER — Telehealth: Payer: Self-pay | Admitting: Sports Medicine

## 2013-05-07 NOTE — Telephone Encounter (Signed)
Pt stopped by stating that she needs a letter stating that she can return back to work and she needs it before 2:00 o'clock tomorrow.  Courtney Adams stated that she didn't have a working phone at this time and will try to come back around 2:00 or tomorrow morning.

## 2013-05-07 NOTE — Telephone Encounter (Signed)
[phone]. Please call this number.

## 2013-05-08 ENCOUNTER — Encounter: Payer: Self-pay | Admitting: Sports Medicine

## 2013-05-08 NOTE — Telephone Encounter (Signed)
Pt may return today to work with 15# lifting restrictions until 05/14/13 Information gleaned from chart does not reveal any significant limitations from Dr. Cherre Huger assessement.  Pt to follow up with me if persistent symptoms

## 2013-05-08 NOTE — Telephone Encounter (Signed)
Patient informed letter left upfront. Ishmail Mcmanamon, [name]

## 2013-05-14 ENCOUNTER — Telehealth: Payer: Self-pay | Admitting: Sports Medicine

## 2013-05-14 NOTE — Telephone Encounter (Signed)
Pt needs letter stating all her restricitions are now lifted. Pt will pick up letter today on her way to work around 2. Please call when letter is ready

## 2013-05-28 ENCOUNTER — Encounter (HOSPITAL_COMMUNITY): Payer: Self-pay | Admitting: Emergency Medicine

## 2013-05-28 ENCOUNTER — Emergency Department (HOSPITAL_COMMUNITY): Payer: Self-pay

## 2013-05-28 DIAGNOSIS — Z8619 Personal history of other infectious and parasitic diseases: Secondary | ICD-10-CM | POA: Insufficient documentation

## 2013-05-28 DIAGNOSIS — R079 Chest pain, unspecified: Secondary | ICD-10-CM | POA: Insufficient documentation

## 2013-05-28 DIAGNOSIS — R42 Dizziness and giddiness: Secondary | ICD-10-CM | POA: Insufficient documentation

## 2013-05-28 DIAGNOSIS — Z3202 Encounter for pregnancy test, result negative: Secondary | ICD-10-CM | POA: Insufficient documentation

## 2013-05-28 LAB — CBC
HCT: 38.9 % (ref 36.0–46.0)
Hemoglobin: 12.9 g/dL (ref 12.0–15.0)
MCH: 32.4 pg (ref 26.0–34.0)
MCHC: 33.2 g/dL (ref 30.0–36.0)
MCV: 97.7 fL (ref 78.0–100.0)
Platelets: 186 10*3/uL (ref 150–400)
RBC: 3.98 MIL/uL (ref 3.87–5.11)
RDW: 12.3 % (ref 11.5–15.5)
WBC: 4.9 10*3/uL (ref 4.0–10.5)

## 2013-05-28 LAB — BASIC METABOLIC PANEL
BUN: 11 mg/dL (ref 6–23)
CO2: 26 mEq/L (ref 19–32)
Calcium: 9.2 mg/dL (ref 8.4–10.5)
Chloride: 102 mEq/L (ref 96–112)
Creatinine, Ser: 0.69 mg/dL (ref 0.50–1.10)
GFR calc Af Amer: >90 mL/min (ref >90)
GFR calc non Af Amer: >90 mL/min (ref >90)
Glucose, Bld: 104 mg/dL — ABNORMAL HIGH (ref 70–99)
Potassium: 3.7 mEq/L (ref 3.5–5.1)
Sodium: 137 mEq/L (ref 135–145)

## 2013-05-28 LAB — POCT I-STAT TROPONIN I: Troponin i, poc: 0.01 ng/mL (ref 0.00–0.08)

## 2013-05-28 LAB — POCT PREGNANCY, URINE: Preg Test, Ur: NEGATIVE

## 2013-05-28 NOTE — ED Notes (Signed)
Pt. reports left lower chest pain with dizziness , " feels hot" onset this evening .

## 2013-05-29 ENCOUNTER — Emergency Department (HOSPITAL_COMMUNITY)
Admission: EM | Admit: 2013-05-29 | Discharge: 2013-05-29 | Discharge status: Against Medical Advice | Payer: Self-pay | Attending: Emergency Medicine | Admitting: Emergency Medicine

## 2013-05-29 HISTORY — DX: Bipolar disorder, unspecified: F31.9

## 2013-05-29 HISTORY — DX: Anemia, unspecified: D64.9

## 2013-05-29 NOTE — ED Notes (Signed)
Pt refusing to be placed in gown and refusing to be placed on monitor. Pt repeatedly saying "I dont want to be here, I dont want you to do anything else." Pt asking what is wrong with her, this nurse informing her that we do not know yet and that's why the doctor will see her shortly. Pt continuing to argue about treatment. Pt refusing to wait to be seen by doctor. This nurse attempted to reason with patient in order to see a doctor. Pt refused. This nurse convinced pt to agree to follow up with PCP as soon as possible. Pt agreed to sign out AMA, but refused to wait for discharge paperwork. Pt left AMA.

## 2013-05-29 NOTE — ED Notes (Signed)
Pt walking around in the lobby. Then approaches desk and informs the nurse that her dizziness is getting worse. Updated pt about the wait. Encouraged the pt to let this RN know if anything else changes.

## 2013-05-31 ENCOUNTER — Encounter: Payer: Self-pay | Admitting: Sports Medicine

## 2013-05-31 ENCOUNTER — Ambulatory Visit (INDEPENDENT_AMBULATORY_CARE_PROVIDER_SITE_OTHER): Payer: Self-pay | Admitting: Sports Medicine

## 2013-05-31 VITALS — BP 107/69 | HR 76 | Temp 99.1°F | Ht 67.0 in | Wt 103.0 lb

## 2013-05-31 DIAGNOSIS — F39 Unspecified mood [affective] disorder: Secondary | ICD-10-CM

## 2013-05-31 DIAGNOSIS — R55 Syncope and collapse: Secondary | ICD-10-CM

## 2013-05-31 MED ORDER — ARIPIPRAZOLE 15 MG PO TABS: 15.0000 mg | ORAL_TABLET | Freq: Every day | ORAL | Status: DC

## 2013-05-31 NOTE — Patient Instructions (Addendum)
   Eat every 6-8 hours  Start Abilify daily.  Let us know if there any problems obtaining the medication  Please follow up with [facility] [place] (paper provided)   If you need anything prior to your next visit please call the clinic. Please Bring all medications or accurate medication list with you to each appointment; an accurate medication list is essential in providing you the best care possible.

## 2013-05-31 NOTE — Assessment & Plan Note (Signed)
Unclear etiology, likely anxiety component.  Labs reviewed from the emergency department as well as EKG without evidence of specific cause. Could be a factor of going prolonged periods without eating ( days) no concerns for food security

## 2013-05-31 NOTE — Progress Notes (Signed)
  DYAN LABARBERA - 27 y.o. female MRN [id number]  Date of birth: Aug 03, 1985  CC, HPI, Interval History & ROS  Courtney Adams is here today for evaluation of dizziness and lightheadedness.  She reports over the past 2 weeks she has had 3 episodes of near syncope.  She has had no overt syncopal episodes but becomes very lightheaded, flushed diaphoretic and had one episode followed by diarrhea.  During this episode she denied tachypalpitations but reported becoming sweaty, had bilateral numbness and tingling down each upper extremity followed by difficulty hearing and tremulousness.   She reports 2 prior episodes of something similar to this when she was a teenager but nothing the last couple of years.  She has not been eating on a routine basis and  Will go days at a time without eating or sleeping then will sleep for 2-3 days.  She reports overall her mood seems to be labile but more so depressed than anything.  She has been trying something to help with her mood.  She denies specific increases in stress but thinks this may be a large contributing factor.  She has cut back on smoking marijuana but still does this frequently.  Pt denies chest pain, dyspnea at rest or exertion, PND, lower extremity edema.   Pertinent History & Care Coordination  No Patient Care Coordination Note on file.  History  Smoking status  . Former Smoker -- 0.20 packs/day  . Types: Cigarettes  Smokeless tobacco  . Never Used    Comment: 2 -3 cigs a day   Health Maintenance Due  Topic  . Influenza Vaccine     Recent Labs  10/03/12 1715  TSH 1.246     Otherwise past Medical, Surgical, Social, and Family History Reviewed per EMR Medications and Allergies reviewed and all updated if necessary. Objective Findings  VITALS: HR: 76 bpm  BP: 107/69 mmHg  TEMP: 99.1 F (37.3 C) (Oral)  RESP:    HT: 5\' 7"  (170.2 cm)  WT: 103 lb (46.72 kg)  BMI: 16.2   BP Readings from Last 3 Encounters:  05/31/13 107/69  05/28/13 115/83   05/04/13 117/74   Wt Readings from Last 3 Encounters:  05/31/13 103 lb (46.72 kg)  05/28/13 108 lb 4.8 oz (49.125 kg)  05/04/13 106 lb (48.081 kg)     PHYSICAL EXAM: GENERAL: Adult African American  female. In no discomfort; no respiratory distress  PSYCH: alert and appropriate, good insight   HNEENT:   CARDIO: RRR, S1/S2 heard, no murmur  LUNGS: CTA B, no wheezes, no crackles  ABDOMEN: Thin,+BS, soft, non-tender, no rigidity, no guarding, no hepatosplenomegaly, palpable aorta at approximately 2to 3 cm in diameter  EXTREM:    GU:   SKIN:     Data reviewed: ED EKG and lab work; EKG NSR labs unremarkable  Assessment & Plan   Problems addressed today: General Plan & Pt Instructions:  1. Pre-syncope   2. Mood disorder       Eat every 6-8 hours  Start Abilify daily.  Let us know if there any problems obtaining the medication  Please follow up with [facility] [place].     For further discussion of A/P and for follow up issues see problem based charting.

## 2013-05-31 NOTE — Assessment & Plan Note (Signed)
Sx likely attributable to HypoMania and not eating. Start Abilify; stop Marijuana;  consider Benzo for anxiety component but hx of polysubstance abuse

## 2013-06-06 ENCOUNTER — Encounter: Payer: Self-pay | Admitting: Family Medicine

## 2013-06-06 ENCOUNTER — Other Ambulatory Visit (HOSPITAL_COMMUNITY)
Admission: RE | Admit: 2013-06-06 | Discharge: 2013-06-06 | Disposition: A | Discharge status: Home / Self Care | Payer: Self-pay | Source: Ambulatory Visit | Attending: Family Medicine | Admitting: Family Medicine

## 2013-06-06 ENCOUNTER — Ambulatory Visit (INDEPENDENT_AMBULATORY_CARE_PROVIDER_SITE_OTHER): Payer: Self-pay | Admitting: Family Medicine

## 2013-06-06 VITALS — BP 108/63 | HR 80 | Temp 99.5°F | Ht 67.0 in | Wt 105.0 lb

## 2013-06-06 DIAGNOSIS — Z113 Encounter for screening for infections with a predominantly sexual mode of transmission: Secondary | ICD-10-CM | POA: Insufficient documentation

## 2013-06-06 DIAGNOSIS — A499 Bacterial infection, unspecified: Secondary | ICD-10-CM

## 2013-06-06 DIAGNOSIS — N760 Acute vaginitis: Secondary | ICD-10-CM

## 2013-06-06 DIAGNOSIS — N898 Other specified noninflammatory disorders of vagina: Secondary | ICD-10-CM

## 2013-06-06 DIAGNOSIS — B9689 Other specified bacterial agents as the cause of diseases classified elsewhere: Secondary | ICD-10-CM

## 2013-06-06 LAB — POCT WET PREP (WET MOUNT): Clue Cells Wet Prep Whiff POC: NEGATIVE

## 2013-06-06 MED ORDER — METRONIDAZOLE 0.75 % VA GEL: VAGINAL | Status: DC

## 2013-06-06 MED ORDER — METRONIDAZOLE 500 MG PO TABS: 500.0000 mg | ORAL_TABLET | Freq: Two times a day (BID) | ORAL | Status: DC

## 2013-06-06 MED ORDER — FLUCONAZOLE 100 MG PO TABS: ORAL_TABLET | ORAL | Status: DC

## 2013-06-06 NOTE — Progress Notes (Signed)
   Subjective:    Patient ID: Courtney Adams, female    DOB: Feb 25, 1986, 28 y.o.   MRN: [id number]  HPI  Vaginal discharge last 3-4 days. Usually starts after her menstrual cycle as it did this month. Her menstrual cycles normal and they are regular. She is sexually active with men. She uses condoms. No new sex partners. Discharge is fairly heavy, she describes it as smelly. There some mild itching. In the past when she's been treated for bacterial vaginosis using that clears up and then she says within a week she gets a yeast infection.  Review of Systems No abdominal pain, no fever, sweats, chills or    Objective:   Physical Exam Bowel sounds are reviewed GENERAL: Well-developed female no acute distress GU: Externally normal. Cervix is normal. No adnexal masses or tenderness. Thick white discharge present in the vaginal vault. Cervical cultures for GC Chlamydia and wet prep obtained.       Assessment & Plan:

## 2013-06-06 NOTE — Assessment & Plan Note (Signed)
She has recurrent episodes of this almost monthly. Usually right after she has her menstrual cycle. We discussed and I will give her a vaginal cream which she prefers but is more expensive as well as the oral. Sometimes she has trouble tolerating the oral pill so I gave her both prescriptions. Also give her multiple refills as it sounds like she has to some almost monthly basis. Also gave her 3 day regimen for yeast vaginitis. Reviewed her Pap smear which is normal.

## 2013-06-12 ENCOUNTER — Encounter: Payer: Self-pay | Admitting: Family Medicine

## 2013-06-29 ENCOUNTER — Ambulatory Visit (INDEPENDENT_AMBULATORY_CARE_PROVIDER_SITE_OTHER): Payer: No Typology Code available for payment source | Admitting: *Deleted

## 2013-06-29 DIAGNOSIS — Z111 Encounter for screening for respiratory tuberculosis: Secondary | ICD-10-CM

## 2013-06-29 NOTE — Progress Notes (Signed)
Tuberculin skin test applied to right ventral forearm. Explained how to read the test, measuring induration not just erythema; she will come into office in the next 48-72 hours to have test read.

## 2013-07-02 ENCOUNTER — Ambulatory Visit (INDEPENDENT_AMBULATORY_CARE_PROVIDER_SITE_OTHER): Payer: No Typology Code available for payment source | Admitting: *Deleted

## 2013-07-02 DIAGNOSIS — Z111 Encounter for screening for respiratory tuberculosis: Secondary | ICD-10-CM

## 2013-07-02 LAB — TB SKIN TEST
Induration: 0 mm
TB Skin Test: NEGATIVE

## 2013-07-02 NOTE — Progress Notes (Signed)
PPD Reading Note PPD read and results entered in Epic. Result: 0 mm induration. Interpretation: negative Allergic reaction: no 

## 2013-08-27 ENCOUNTER — Encounter: Payer: Self-pay | Admitting: Sports Medicine

## 2013-08-27 ENCOUNTER — Other Ambulatory Visit (HOSPITAL_COMMUNITY)
Admission: RE | Admit: 2013-08-27 | Discharge: 2013-08-27 | Disposition: A | Discharge status: Home / Self Care | Payer: No Typology Code available for payment source | Source: Ambulatory Visit | Attending: Sports Medicine | Admitting: Sports Medicine

## 2013-08-27 ENCOUNTER — Ambulatory Visit (INDEPENDENT_AMBULATORY_CARE_PROVIDER_SITE_OTHER): Payer: No Typology Code available for payment source | Admitting: Sports Medicine

## 2013-08-27 VITALS — BP 110/68 | HR 78 | Ht 67.0 in | Wt 105.0 lb

## 2013-08-27 DIAGNOSIS — A499 Bacterial infection, unspecified: Secondary | ICD-10-CM

## 2013-08-27 DIAGNOSIS — N898 Other specified noninflammatory disorders of vagina: Secondary | ICD-10-CM

## 2013-08-27 DIAGNOSIS — B9689 Other specified bacterial agents as the cause of diseases classified elsewhere: Secondary | ICD-10-CM

## 2013-08-27 DIAGNOSIS — Z113 Encounter for screening for infections with a predominantly sexual mode of transmission: Secondary | ICD-10-CM | POA: Insufficient documentation

## 2013-08-27 DIAGNOSIS — F39 Unspecified mood [affective] disorder: Secondary | ICD-10-CM

## 2013-08-27 DIAGNOSIS — N760 Acute vaginitis: Secondary | ICD-10-CM

## 2013-08-27 LAB — POCT WET PREP (WET MOUNT): Clue Cells Wet Prep Whiff POC: POSITIVE

## 2013-08-27 NOTE — Patient Instructions (Signed)
We will call you with the results if they are abnormal.  Otherwise I will send you a letter.  Please return as needed for your mood

## 2013-08-28 ENCOUNTER — Telehealth: Payer: Self-pay | Admitting: Sports Medicine

## 2013-08-28 LAB — CERVICOVAGINAL ANCILLARY ONLY
Chlamydia: NEGATIVE
Neisseria Gonorrhea: NEGATIVE

## 2013-08-28 NOTE — Telephone Encounter (Signed)
Called and left patient a voice mail indicating she should retake her MetroGel.    Instructed to call back if any further questions.

## 2013-08-29 NOTE — Assessment & Plan Note (Signed)
Problem Based Documentation:    Subjective Report:  Patient did not fill Abilify due to insurance issues.  Reports overall significantly better and not having any manic or hypomanic episodes.  Denies depression.  Reports overall improvement due to prior and surrounding herself with good people.     Assessment & Plan & Follow up Issues:  Chronic, controlled condition No evidence of SI/HI, no evidence of mania or hypomania 1. Continue off medications.  Encouraged continued healthy behaviors.  Counseled on warning signs for early return  > Troublesome or problematic try Abilify  .

## 2013-08-29 NOTE — Assessment & Plan Note (Signed)
Problem Based Documentation:    Subjective Report:  Patient with recurrent vaginal discharge.  Reports thick creamy white discharge.  No odor.  No dysuria, frequency, urinary hesitancy. Pt denies any fevers, chills, or rigors.  Concerned for potential STI exposure given socially active with reported monogamous partner but has had exposure to STI in the past  Recurrent BV with refills has not used MetroGel in the past 6 weeks.       Assessment & Plan & Follow up Issues:   chronic, recurrent  condition 1. No evidence of yeast on exam.  Treat again for BV 2. Encouraged good source of probiotic >   Consider alternative treatment with clindamycin and continues to be recurrent  .

## 2013-08-29 NOTE — Progress Notes (Signed)
  Courtney Adams - 28 y.o. female MRN [id number]  Date of birth: 09/18/1985  SUBJECTIVE:  Pt is here today with a chief complaint of: vaginal d/c and STD check   She reports overall things are going extraordinarily well. For further subjective including (HPI, Interval History & ROS) please see problem based charting  HISTORY: Wt Readings from Last 3 Encounters:  08/27/13 105 lb (47.628 kg)  06/06/13 105 lb (47.628 kg)  05/31/13 103 lb (46.72 kg)   BP Readings from Last 3 Encounters:  08/27/13 110/68  06/06/13 108/63  05/31/13 107/69    History  Smoking status  . Former Smoker -- 0.20 packs/day  . Types: Cigarettes  Smokeless tobacco  . Never Used    Comment: 2 -3 cigs a day   No health maintenance topics applied.  Otherwise past Medical, Surgical, Social, and Family History Reviewed per EMR Medications and Allergies reviewed and updated per below.  VITALS: BP 110/68  Pulse 78  Ht 5\' 7"  (1.702 m)  Wt 105 lb (47.628 kg)  BMI 16.44 kg/m2  LMP 08/11/2013  PHYSICAL EXAM: GENERAL:  adult, thin, African American female. In no discomfort; no respiratory distress  PSYCH: alert and appropriate, good  and appropriate insight   HNEENT:   CARDIO:   LUNGS:   ABDOMEN:   EXTREM:    GU: Chaperone: CMA  External: no skin lesions, normal appearing genitalia, no discharge  Vagina: no vaginal lesion, vaginal mucosa moist, thick white, creamy discharge  Cervix: no cervical lesions, no CMT  Uterus: normal size, shape, consistency  non-tender  Adenexa: no masses, non-tender  TESTS:      SKIN:     MEDICATIONS, LABS & OTHER ORDERS: Previous Medications   FLUCONAZOLE (DIFLUCAN) 100 MG TABLET    Take one tablet by mouth every other day for three doses prn vaginal yeast infection   METRONIDAZOLE (FLAGYL) 500 MG TABLET    Take 1 tablet (500 mg total) by mouth 2 (two) times daily.   METRONIDAZOLE (METROGEL) 0.75 % VAGINAL GEL    Place one applicator ful in vagina at bedtime daily for 5-7  days   Modified Medications   No medications on file   New Prescriptions   No medications on file   Discontinued Medications   ACETAMINOPHEN (TYLENOL) 500 MG TABLET    Take 500 mg by mouth every 6 (six) hours as needed for mild pain or moderate pain.   ARIPIPRAZOLE (ABILIFY) 15 MG TABLET    Take 1 tablet (15 mg total) by mouth daily.   Orders Placed This Encounter  Procedures  . POCT Wet Prep [facility]([place] [place])   ASSESSMENT & PLAN: See problem based charting & AVS for pt instructions.

## 2013-09-21 ENCOUNTER — Ambulatory Visit (INDEPENDENT_AMBULATORY_CARE_PROVIDER_SITE_OTHER): Payer: Medicaid Other | Admitting: Sports Medicine

## 2013-09-21 VITALS — BP 100/65 | HR 103 | Temp 98.7°F | Ht 67.0 in | Wt 103.0 lb

## 2013-09-21 DIAGNOSIS — Z331 Pregnant state, incidental: Secondary | ICD-10-CM

## 2013-09-21 DIAGNOSIS — F39 Unspecified mood [affective] disorder: Secondary | ICD-10-CM

## 2013-09-21 DIAGNOSIS — Z3490 Encounter for supervision of normal pregnancy, unspecified, unspecified trimester: Secondary | ICD-10-CM | POA: Insufficient documentation

## 2013-09-21 DIAGNOSIS — Z309 Encounter for contraceptive management, unspecified: Secondary | ICD-10-CM

## 2013-09-21 DIAGNOSIS — N912 Amenorrhea, unspecified: Secondary | ICD-10-CM

## 2013-09-21 LAB — POCT URINE PREGNANCY: Preg Test, Ur: POSITIVE

## 2013-09-21 MED ORDER — PRENATAL MULTIVITAMIN CH: 1.0000 | ORAL_TABLET | Freq: Every day | ORAL | Status: DC

## 2013-09-21 MED ORDER — VITAMIN B-6 100 MG PO TABS: 100.0000 mg | ORAL_TABLET | Freq: Every day | ORAL | Status: DC

## 2013-09-21 NOTE — Patient Instructions (Signed)
Please call Dr. Clearance CootsHarper to establish care. I have sent in a prenatal vitamin and a pill to help with nausea

## 2013-09-21 NOTE — Assessment & Plan Note (Signed)
Positive pregnancy test today. Recommend to start prenatal vitamin, patient is elected to followup with Dr. Clearance CootsHarper.  B6 recommended for nausea.

## 2013-09-21 NOTE — Progress Notes (Signed)
  Courtney Adams - 28 y.o. female MRN [id number]  Date of birth: 11/14/1985  SUBJECTIVE:     CC: Amenorrhea See problem based charting for additional subjective (including HPI, Interval History & ROS)   She is here today for a urine pregnancy test.  LMP 08/11/2013.  No bleeding, cramping since her LMP  Denies any dysuria, frequency, vaginal discharge.  Known history of recurrent BV but currently asymptomatic.  Previously followed by Dr. Clearance CootsHarper  Reports continued issues with labile mood and some reported depressed mood but no SI, HI or specific concerns.  Feels well supported by family members in church.  Not taking prenatal  Some nausea no vomiting.  Poor by mouth intake due to nausea  HISTORY:  Recent Labs  10/03/12 1640 10/03/12 1715  LDLDIRECT 82  --   TSH  --  1.246  FREET4  --  0.94  } Wt Readings from Last 3 Encounters:  09/21/13 103 lb (46.72 kg)  08/27/13 105 lb (47.628 kg)  06/06/13 105 lb (47.628 kg)   BP Readings from Last 3 Encounters:  09/21/13 100/65  08/27/13 110/68  06/06/13 108/63    History  Smoking status  . Former Smoker -- 0.20 packs/day  . Types: Cigarettes  Smokeless tobacco  . Never Used    Comment: 2 -3 cigs a day   There are no preventive care reminders to display for this patient.  Otherwise past Medical, Surgical, Social, and Family History Reviewed per EMR Medications and Allergies reviewed and updated per below.  VITALS: BP 100/65  Pulse 103  Temp(Src) 98.7 F (37.1 C) (Oral)  Ht 5\' 7"  (1.702 m)  Wt 103 lb (46.72 kg)  BMI 16.13 kg/m2  LMP 08/11/2013  PHYSICAL EXAM: GENERAL:  adult thin African American female. In no discomfort; no respiratory distress  PSYCH: alert and appropriate, good insight  Slightly depressed mood compared to last visit however no evidence of SI, HI or intents.      MEDICATIONS, LABS & OTHER ORDERS: Previous Medications   FLUCONAZOLE (DIFLUCAN) 100 MG TABLET    Take one tablet by mouth every other day  for three doses prn vaginal yeast infection   METRONIDAZOLE (FLAGYL) 500 MG TABLET    Take 1 tablet (500 mg total) by mouth 2 (two) times daily.   METRONIDAZOLE (METROGEL) 0.75 % VAGINAL GEL    Place one applicator ful in vagina at bedtime daily for 5-7 days   Modified Medications   No medications on file   New Prescriptions   PRENATAL VIT-FE FUMARATE-FA (PRENATAL MULTIVITAMIN) TABS TABLET    Take 1 tablet by mouth daily at 12 noon.   PYRIDOXINE (VITAMIN B-6) 100 MG TABLET    Take 1 tablet (100 mg total) by mouth daily.   Discontinued Medications   No medications on file   Orders Placed This Encounter  Procedures  . POCT urine pregnancy   ASSESSMENT & PLAN: See problem based charting & AVS for pt instructions.

## 2013-09-21 NOTE — Assessment & Plan Note (Signed)
Patient seems to be somewhat depressed today compared to previous encounters. No evidence of self harm but may find coping with pregnancy a difficult thing.  Provided encouragement and offered referral for counseling which patient declined.  Not a candidate at this time for initiation of new medications unless significant worsening.  Caution with addition of SSRI as patient is likely bipolar and has had issues with what appeared to be type I mania symptoms.

## 2013-09-25 ENCOUNTER — Inpatient Hospital Stay (HOSPITAL_COMMUNITY)
Admission: AD | Admit: 2013-09-25 | Discharge: 2013-09-25 | Discharge status: Against Medical Advice | Payer: No Typology Code available for payment source | Source: Ambulatory Visit | Attending: Obstetrics & Gynecology | Admitting: Obstetrics & Gynecology

## 2013-09-25 ENCOUNTER — Encounter (HOSPITAL_COMMUNITY): Payer: Self-pay

## 2013-09-25 DIAGNOSIS — O9989 Other specified diseases and conditions complicating pregnancy, childbirth and the puerperium: Principal | ICD-10-CM

## 2013-09-25 DIAGNOSIS — R51 Headache: Secondary | ICD-10-CM | POA: Insufficient documentation

## 2013-09-25 DIAGNOSIS — O99891 Other specified diseases and conditions complicating pregnancy: Secondary | ICD-10-CM | POA: Diagnosis present

## 2013-09-25 DIAGNOSIS — O210 Mild hyperemesis gravidarum: Secondary | ICD-10-CM | POA: Diagnosis not present

## 2013-09-25 LAB — URINALYSIS, ROUTINE W REFLEX MICROSCOPIC
Bilirubin Urine: NEGATIVE
Glucose, UA: NEGATIVE mg/dL
Ketones, ur: NEGATIVE mg/dL
Leukocytes, UA: NEGATIVE
Nitrite: NEGATIVE
Protein, ur: NEGATIVE mg/dL
Specific Gravity, Urine: >1.03 — ABNORMAL HIGH (ref 1.005–1.030)
Urobilinogen, UA: 0.2 mg/dL (ref 0.0–1.0)
pH: 6 (ref 5.0–8.0)

## 2013-09-25 LAB — URINE MICROSCOPIC-ADD ON

## 2013-09-25 MED ORDER — ONDANSETRON 8 MG PO TBDP: 8.0000 mg | ORAL_TABLET | Freq: Once | ORAL | Status: DC

## 2013-09-25 MED ORDER — BUTALBITAL-APAP-CAFFEINE 50-325-40 MG PO TABS: 2.0000 | ORAL_TABLET | Freq: Once | ORAL | Status: DC

## 2013-09-25 NOTE — MAU Note (Signed)
Patient [facility]. States she has been nauseated, no vomiting, unable to sleep, feels tired and has had a headache for a week. States she has no appetite and not able to drink fluids. Denies bleeding or discharge.

## 2013-09-25 NOTE — Progress Notes (Signed)
Patient apparently left AMA after NP visit. Was not in room. Clothing and personal items gone.

## 2013-09-25 NOTE — MAU Provider Note (Signed)
  History     CSN: [id number]  Arrival date and time: 09/25/13 1204   First Provider Initiated Contact with Patient 09/25/13 1324      Chief Complaint  Patient presents with  . Headache  . Nausea   HPIpt is 6960w3d pregnant and presents with headache and nausea.  Pt states she has not taken anything for the Headache and doesn't like to take pills.  Pt said she didn't want this pregnancy because she didn't want to feel this way. Pt was asking about termination.  I told pt we did not give that  Information. I offered pain medicine for the headache. Pt put her clothes on and left before exam.  Rn note:  Signed  MAU Note Service date: 09/25/2013 12:32 PM   Patient states she [facility]. States she has been nauseated, no vomiting, unable to sleep, feels tired and has had a headache for a week. States she has no appetite and not able to drink fluids. Denies bleeding or discharge     Past Medical History  Diagnosis Date  . BV (bacterial vaginosis)   . Anxiety   . Preterm delivery   . Chlamydia   . Gonorrhea   . Trichomonas   . Bipolar 1 disorder   . Anxiety   . Anemia     Past Surgical History  Procedure Laterality Date  . Dilation and curettage of uterus      Family History  Problem Relation Age of Onset  . Anemia Mother   . Cervical cancer Sister     History  Substance Use Topics  . Smoking status: Former Smoker -- 0.20 packs/day    Types: Cigarettes  . Smokeless tobacco: Never Used     Comment: 2 -3 cigs a day  . Alcohol Use: No    Allergies: No Known Allergies  Prescriptions prior to admission  Medication Sig Dispense Refill  . Prenatal Vit-Fe Fumarate-FA (PRENATAL MULTIVITAMIN) TABS tablet Take 1 tablet by mouth daily at 12 noon.      . [DISCONTINUED] Prenatal Vit-Fe Fumarate-FA (PRENATAL MULTIVITAMIN) TABS tablet Take 1 tablet by mouth daily at 12 noon.  100 tablet  2    ROS Physical Exam   Blood pressure 112/74,  pulse 82, temperature 99.3 F (37.4 C), temperature source Oral, resp. rate 16, height 5\' 7"  (1.702 m), weight 47.718 kg (105 lb 3.2 oz), last menstrual period 08/11/2013, SpO2 100.00%.  Physical Exam  MAU Course  Procedures Pt left AMA   Assessment and Plan    Jean RosenthalSusan P Arlenne Kimbley 09/25/2013, 1:24 PM

## 2013-10-01 ENCOUNTER — Emergency Department (HOSPITAL_COMMUNITY): Payer: No Typology Code available for payment source

## 2013-10-01 ENCOUNTER — Emergency Department (HOSPITAL_COMMUNITY)
Admission: EM | Admit: 2013-10-01 | Discharge: 2013-10-01 | Disposition: A | Discharge status: Home / Self Care | Payer: No Typology Code available for payment source | Attending: Emergency Medicine | Admitting: Emergency Medicine

## 2013-10-01 ENCOUNTER — Encounter (HOSPITAL_COMMUNITY): Payer: Self-pay | Admitting: Emergency Medicine

## 2013-10-01 DIAGNOSIS — Z862 Personal history of diseases of the blood and blood-forming organs and certain disorders involving the immune mechanism: Secondary | ICD-10-CM | POA: Insufficient documentation

## 2013-10-01 DIAGNOSIS — Z79899 Other long term (current) drug therapy: Secondary | ICD-10-CM | POA: Insufficient documentation

## 2013-10-01 DIAGNOSIS — R5383 Other fatigue: Secondary | ICD-10-CM

## 2013-10-01 DIAGNOSIS — R519 Headache, unspecified: Secondary | ICD-10-CM

## 2013-10-01 DIAGNOSIS — O9989 Other specified diseases and conditions complicating pregnancy, childbirth and the puerperium: Secondary | ICD-10-CM | POA: Insufficient documentation

## 2013-10-01 DIAGNOSIS — B373 Candidiasis of vulva and vagina: Secondary | ICD-10-CM | POA: Insufficient documentation

## 2013-10-01 DIAGNOSIS — R110 Nausea: Secondary | ICD-10-CM | POA: Insufficient documentation

## 2013-10-01 DIAGNOSIS — R42 Dizziness and giddiness: Secondary | ICD-10-CM | POA: Insufficient documentation

## 2013-10-01 DIAGNOSIS — O98819 Other maternal infectious and parasitic diseases complicating pregnancy, unspecified trimester: Secondary | ICD-10-CM | POA: Insufficient documentation

## 2013-10-01 DIAGNOSIS — Z3490 Encounter for supervision of normal pregnancy, unspecified, unspecified trimester: Secondary | ICD-10-CM

## 2013-10-01 DIAGNOSIS — R51 Headache: Secondary | ICD-10-CM | POA: Insufficient documentation

## 2013-10-01 DIAGNOSIS — B379 Candidiasis, unspecified: Secondary | ICD-10-CM

## 2013-10-01 DIAGNOSIS — F430 Acute stress reaction: Secondary | ICD-10-CM | POA: Insufficient documentation

## 2013-10-01 DIAGNOSIS — Z87891 Personal history of nicotine dependence: Secondary | ICD-10-CM | POA: Insufficient documentation

## 2013-10-01 DIAGNOSIS — R5381 Other malaise: Secondary | ICD-10-CM | POA: Insufficient documentation

## 2013-10-01 DIAGNOSIS — R0602 Shortness of breath: Secondary | ICD-10-CM | POA: Insufficient documentation

## 2013-10-01 DIAGNOSIS — O99340 Other mental disorders complicating pregnancy, unspecified trimester: Secondary | ICD-10-CM | POA: Insufficient documentation

## 2013-10-01 DIAGNOSIS — B3731 Acute candidiasis of vulva and vagina: Secondary | ICD-10-CM | POA: Insufficient documentation

## 2013-10-01 DIAGNOSIS — G4700 Insomnia, unspecified: Secondary | ICD-10-CM | POA: Insufficient documentation

## 2013-10-01 LAB — ABO/RH: ABO/RH(D): O POS

## 2013-10-01 LAB — CBC WITH DIFFERENTIAL/PLATELET
Basophils Absolute: 0 10*3/uL (ref 0.0–0.1)
Basophils Relative: 0 % (ref 0–1)
Eosinophils Absolute: 0 10*3/uL (ref 0.0–0.7)
Eosinophils Relative: 1 % (ref 0–5)
HCT: 38.5 % (ref 36.0–46.0)
Hemoglobin: 13.3 g/dL (ref 12.0–15.0)
Lymphocytes Relative: 27 % (ref 12–46)
Lymphs Abs: 1.1 10*3/uL (ref 0.7–4.0)
MCH: 32.9 pg (ref 26.0–34.0)
MCHC: 34.5 g/dL (ref 30.0–36.0)
MCV: 95.3 fL (ref 78.0–100.0)
Monocytes Absolute: 0.3 10*3/uL (ref 0.1–1.0)
Monocytes Relative: 6 % (ref 3–12)
Neutro Abs: 2.6 10*3/uL (ref 1.7–7.7)
Neutrophils Relative %: 66 % (ref 43–77)
Platelets: 195 10*3/uL (ref 150–400)
RBC: 4.04 MIL/uL (ref 3.87–5.11)
RDW: 11.8 % (ref 11.5–15.5)
WBC: 3.9 10*3/uL — ABNORMAL LOW (ref 4.0–10.5)

## 2013-10-01 LAB — COMPREHENSIVE METABOLIC PANEL
ALT: 13 U/L (ref 0–35)
AST: 16 U/L (ref 0–37)
Albumin: 3.9 g/dL (ref 3.5–5.2)
Alkaline Phosphatase: 53 U/L (ref 39–117)
BUN: 10 mg/dL (ref 6–23)
CO2: 26 mEq/L (ref 19–32)
Calcium: 10.1 mg/dL (ref 8.4–10.5)
Chloride: 99 mEq/L (ref 96–112)
Creatinine, Ser: 0.68 mg/dL (ref 0.50–1.10)
GFR calc Af Amer: >90 mL/min (ref >90)
GFR calc non Af Amer: >90 mL/min (ref >90)
Glucose, Bld: 64 mg/dL — ABNORMAL LOW (ref 70–99)
Potassium: 3.5 mEq/L — ABNORMAL LOW (ref 3.7–5.3)
Sodium: 137 mEq/L (ref 137–147)
Total Bilirubin: 0.7 mg/dL (ref 0.3–1.2)
Total Protein: 7.7 g/dL (ref 6.0–8.3)

## 2013-10-01 LAB — URINALYSIS, ROUTINE W REFLEX MICROSCOPIC
Bilirubin Urine: NEGATIVE
Glucose, UA: NEGATIVE mg/dL
Hgb urine dipstick: NEGATIVE
Ketones, ur: NEGATIVE mg/dL
Leukocytes, UA: NEGATIVE
Nitrite: NEGATIVE
Protein, ur: NEGATIVE mg/dL
Specific Gravity, Urine: 1.03 (ref 1.005–1.030)
Urobilinogen, UA: 0.2 mg/dL (ref 0.0–1.0)
pH: 6 (ref 5.0–8.0)

## 2013-10-01 LAB — HCG, QUANTITATIVE, PREGNANCY: hCG, Beta Chain, Quant, S: 74072 m[IU]/mL — ABNORMAL HIGH (ref <5)

## 2013-10-01 LAB — WET PREP, GENITAL
Clue Cells Wet Prep HPF POC: NONE SEEN
Trich, Wet Prep: NONE SEEN
Yeast Wet Prep HPF POC: NONE SEEN

## 2013-10-01 LAB — HIV ANTIBODY (ROUTINE TESTING W REFLEX): HIV 1&2 Ab, 4th Generation: NONREACTIVE

## 2013-10-01 MED ORDER — ACETAMINOPHEN 325 MG PO TABS
975.0000 mg | ORAL_TABLET | Freq: Once | ORAL | Status: AC
  Administered 2013-10-01: 975 mg via ORAL
  Filled 2013-10-01: qty 3

## 2013-10-01 NOTE — Discharge Instructions (Signed)
Read the information below.  You may return to the Emergency Department at any time for worsening condition or any new symptoms that concern you.  If you develop high fevers, worsening abdominal pain, uncontrolled vomiting, or are unable to tolerate fluids by mouth, return to the ER for a recheck.    You are having a headache. No specific cause was found today for your headache. It may have been a migraine or other cause of headache. Stress, anxiety, fatigue, and depression are common triggers for headaches. Your headache today does not appear to be life-threatening or require hospitalization, but often the exact cause of headaches is not determined in the emergency department. Therefore, follow-up with your doctor is very important to find out what may have caused your headache, and whether or not you need any further diagnostic testing or treatment. Sometimes headaches can appear benign (not harmful), but then more serious symptoms can develop which should prompt an immediate re-evaluation by your doctor or the emergency department. SEEK MEDICAL ATTENTION IF: You develop possible problems with medications prescribed.  The medications don't resolve your headache, if it recurs , or if you have multiple episodes of vomiting or can't take fluids. You have a change from the usual headache. RETURN IMMEDIATELY IF you develop a sudden, severe headache or confusion, become poorly responsive or faint, develop a fever above 100.52F or problem breathing, have a change in speech, vision, swallowing, or understanding, or develop new weakness, numbness, tingling, incoordination, or have a seizure.    Pregnancy - First Trimester During sexual intercourse, millions of sperm go into the vagina. Only 1 sperm will penetrate and fertilize the female egg while it is in the Fallopian tube. One week later, the fertilized egg implants into the wall of the uterus. An embryo begins to develop into a baby. At 6 to 8 weeks, the eyes  and face are formed and the heartbeat can be seen on ultrasound. At the end of 12 weeks (first trimester), all the baby's organs are formed. Now that you are pregnant, you will want to do everything you can to have a healthy baby. Two of the most important things are to get good prenatal care and follow your caregiver's instructions. Prenatal care is all the medical care you receive before the baby's birth. It is given to prevent, find, and treat problems during the pregnancy and childbirth. PRENATAL EXAMS  During prenatal visits, your weight, blood pressure, and urine are checked. This is done to make sure you are healthy and progressing normally during the pregnancy.  A pregnant woman should gain 25 to 35 pounds during the pregnancy. However, if you are overweight or underweight, your caregiver will advise you regarding your weight.  Your caregiver will ask and answer questions for you.  Blood work, cervical cultures, other necessary tests, and a Pap test are done during your prenatal exams. These tests are done to check on your health and the probable health of your baby. Tests are strongly recommended and done for HIV with your permission. This is the virus that causes AIDS. These tests are done because medicines can be given to help prevent your baby from being born with this infection should you have been infected without knowing it. Blood work is also used to find out your blood type, previous infections, and follow your blood levels (hemoglobin).  Low hemoglobin (anemia) is common during pregnancy. Iron and vitamins are given to help prevent this. Later in the pregnancy, blood tests for diabetes will  be done along with any other tests if any problems develop.  You may need other tests to make sure you and the baby are doing well. CHANGES DURING THE FIRST TRIMESTER  Your body goes through many changes during pregnancy. They vary from person to person. Talk to your caregiver about changes you  notice and are concerned about. Changes can include:  Your menstrual period stops.  The egg and sperm carry the genes that determine what you look like. Genes from you and your partner are forming a baby. The female genes determine whether the baby is a boy or a girl.  Your body increases in girth and you may feel bloated.  Feeling sick to your stomach (nauseous) and throwing up (vomiting). If the vomiting is uncontrollable, call your caregiver.  Your breasts will begin to enlarge and become tender.  Your nipples may stick out more and become darker.  The need to urinate more. Painful urination may mean you have a bladder infection.  Tiring easily.  Loss of appetite.  Cravings for certain kinds of food.  At first, you may gain or lose a couple of pounds.  You may have changes in your emotions from day to day (excited to be pregnant or concerned something may go wrong with the pregnancy and baby).  You may have more vivid and strange dreams. HOME CARE INSTRUCTIONS   It is very important to avoid all smoking, alcohol and non-prescribed drugs during your pregnancy. These affect the formation and growth of the baby. Avoid chemicals while pregnant to ensure the delivery of a healthy infant.  Start your prenatal visits by the 12th week of pregnancy. They are usually scheduled monthly at first, then more often in the last 2 months before delivery. Keep your caregiver's appointments. Follow your caregiver's instructions regarding medicine use, blood and lab tests, exercise, and diet.  During pregnancy, you are providing food for you and your baby. Eat regular, well-balanced meals. Choose foods such as meat, fish, milk and other low fat dairy products, vegetables, fruits, and whole-grain breads and cereals. Your caregiver will tell you of the ideal weight gain.  You can help morning sickness by keeping soda crackers at the bedside. Eat a couple before arising in the morning. You may want to  use the crackers without salt on them.  Eating 4 to 5 small meals rather than 3 large meals a day also may help the nausea and vomiting.  Drinking liquids between meals instead of during meals also seems to help nausea and vomiting.  A physical sexual relationship may be continued throughout pregnancy if there are no other problems. Problems may be early (premature) leaking of amniotic fluid from the membranes, vaginal bleeding, or belly (abdominal) pain.  Exercise regularly if there are no restrictions. Check with your caregiver or physical therapist if you are unsure of the safety of some of your exercises. Greater weight gain will occur in the last 2 trimesters of pregnancy. Exercising will help:  Control your weight.  Keep you in shape.  Prepare you for labor and delivery.  Help you lose your pregnancy weight after you deliver your baby.  Wear a good support or jogging bra for breast tenderness during pregnancy. This may help if worn during sleep too.  Ask when prenatal classes are available. Begin classes when they are offered.  Do not use hot tubs, steam rooms, or saunas.  Wear your seat belt when driving. This protects you and your baby if you are in  an accident.  Avoid raw meat, uncooked cheese, cat litter boxes, and soil used by cats throughout the pregnancy. These carry germs that can cause birth defects in the baby.  The first trimester is a good time to visit your dentist for your dental health. Getting your teeth cleaned is okay. Use a softer toothbrush and brush gently during pregnancy.  Ask for help if you have financial, counseling, or nutritional needs during pregnancy. Your caregiver will be able to offer counseling for these needs as well as refer you for other special needs.  Do not take any medicines or herbs unless told by your caregiver.  Inform your caregiver if there is any mental or physical domestic violence.  Make a list of emergency phone numbers of  family, friends, hospital, and police and fire departments.  Write down your questions. Take them to your prenatal visit.  Do not douche.  Do not cross your legs.  If you have to stand for long periods of time, rotate you feet or take small steps in a circle.  You may have more vaginal secretions that may require a sanitary pad. Do not use tampons or scented sanitary pads. MEDICINES AND DRUG USE IN PREGNANCY  Take prenatal vitamins as directed. The vitamin should contain 1 milligram of folic acid. Keep all vitamins out of reach of children. Only a couple vitamins or tablets containing iron may be fatal to a baby or young child when ingested.  Avoid use of all medicines, including herbs, over-the-counter medicines, not prescribed or suggested by your caregiver. Only take over-the-counter or prescription medicines for pain, discomfort, or fever as directed by your caregiver. Do not use aspirin, ibuprofen, or naproxen unless directed by your caregiver.  Let your caregiver also know about herbs you may be using.  Alcohol is related to a number of birth defects. This includes fetal alcohol syndrome. All alcohol, in any form, should be avoided completely. Smoking will cause low birth rate and premature babies.  Street or illegal drugs are very harmful to the baby. They are absolutely forbidden. A baby born to an addicted mother will be addicted at birth. The baby will go through the same withdrawal an adult does.  Let your caregiver know about any medicines that you have to take and for what reason you take them. SEEK MEDICAL CARE IF:  You have any concerns or worries during your pregnancy. It is better to call with your questions if you feel they cannot wait, rather than worry about them. SEEK IMMEDIATE MEDICAL CARE IF:   An unexplained oral temperature above 102 F (38.9 C) develops, or as your caregiver suggests.  You have leaking of fluid from the vagina (birth canal). If leaking  membranes are suspected, take your temperature and inform your caregiver of this when you call.  There is vaginal spotting or bleeding. Notify your caregiver of the amount and how many pads are used.  You develop a bad smelling vaginal discharge with a change in the color.  You continue to feel sick to your stomach (nauseated) and have no relief from remedies suggested. You vomit blood or coffee ground-like materials.  You lose more than 2 pounds of weight in 1 week.  You gain more than 2 pounds of weight in 1 week and you notice swelling of your face, hands, feet, or legs.  You gain 5 pounds or more in 1 week (even if you do not have swelling of your hands, face, legs, or feet).  You  get exposed to [place] measles and have never had them.  You are exposed to fifth disease or chickenpox.  You develop belly (abdominal) pain. Round ligament discomfort is a common non-cancerous (benign) cause of abdominal pain in pregnancy. Your caregiver still must evaluate this.  You develop headache, fever, diarrhea, pain with urination, or shortness of breath.  You fall or are in a car accident or have any kind of trauma.  There is mental or physical violence in your home. Document Released: 05/25/2001 Document Revised: 02/23/2012 Document Reviewed: 11/26/2008 [facility] Patient Information 2014 [place], [place].

## 2013-10-01 NOTE — ED Notes (Signed)
Pt c/o headache and vaginal discharge clear to yellow. [facility] but felt they just wanted to give her pain pills so she left. Pt is currently about 6-[redacted] weeks pregnant.

## 2013-10-01 NOTE — ED Provider Notes (Signed)
CSN: [id number]     Arrival date & time 10/01/13  4098 History   First MD Initiated Contact with Patient 10/01/13 1017     Chief Complaint  Patient presents with  . Headache  . Vaginal Discharge     (Consider location/radiation/quality/duration/timing/severity/associated sxs/prior Treatment) The history is provided by the patient and medical records.    G4P1 with 2 prior elective abortions, currently pregnant LMP Feb 28 with planned elective abortion in 4 days presents with multiple complaints.    Has had intermittent throbbing headache across her forehead x 1 month.  It is exacerbated with hunger and stress.  She has taken no medication for her pain.    She also has abnormal vaginal discharge that is described as yellow or clear mucus without any vaginal discomfort, odor, itching or burning.  She does have lower abdominal "soreness."  Does note some brown discharge previously.  Denies urinary symptoms except for frequency.  No changes in bowel habits.  Vomited once, emesis described as "spit."   She also complains of sleeplessness, feeling tired, generalized weakness, lightheadedness, and mild SOB.  States all of these symptoms are exacerbated if she hasn't eaten recently but notes she is eating at least 5 times/day.  She did not have these symptoms in prior pregnancies.   Pt was seen in [facility] 4/14 but left AMA, she states this was because they "wanted to give (her) pain medication and not find out what was wrong."  Past Medical History  Diagnosis Date  . BV (bacterial vaginosis)   . Anxiety   . Preterm delivery   . Chlamydia   . Gonorrhea   . Trichomonas   . Bipolar 1 disorder   . Anxiety   . Anemia    Past Surgical History  Procedure Laterality Date  . Dilation and curettage of uterus     Family History  Problem Relation Age of Onset  . Anemia Mother   . Cervical cancer Sister    History  Substance Use Topics  . Smoking status: Former Smoker -- 0.20 packs/day     Types: Cigarettes  . Smokeless tobacco: Never Used     Comment: 2 -3 cigs a day  . Alcohol Use: No   OB History   Grav Para Term Preterm Abortions TAB SAB Ect Mult Living   4 1  1 2 2    1      Review of Systems  Constitutional: Negative for fever.       +hot flashes  Respiratory: Positive for shortness of breath. Negative for cough.   Cardiovascular: Negative for chest pain.  Gastrointestinal: Positive for nausea. Negative for vomiting, abdominal pain, diarrhea, constipation and blood in stool.  Genitourinary: Positive for frequency and vaginal discharge. Negative for dysuria, urgency and vaginal bleeding.  Neurological: Positive for light-headedness and headaches.      Allergies  Review of patient's allergies indicates no known allergies.  Home Medications   Prior to Admission medications   Medication Sig Start Date End Date Taking? Authorizing Provider  Prenatal Vit-Fe Fumarate-FA (PRENATAL MULTIVITAMIN) TABS tablet Take 1 tablet by mouth daily at 12 noon. 09/21/13   Andrena Mews, DO   BP 112/75  Pulse 96  Temp(Src) 98 F (36.7 C) (Oral)  Resp 17  Wt 105 lb (47.628 kg)  SpO2 100%  LMP 08/11/2013 Physical Exam  Nursing note and vitals reviewed. Constitutional: She appears well-developed and well-nourished. No distress.  HENT:  Head: Normocephalic and atraumatic.  Eyes: Conjunctivae and EOM  are normal.  Neck: Neck supple.  Cardiovascular: Normal rate and regular rhythm.   Pulmonary/Chest: Effort normal and breath sounds normal. No respiratory distress. She has no wheezes. She has no rales.  Abdominal: Soft. She exhibits no distension. There is generalized tenderness. There is no rebound and no guarding.  Very mild diffuse tenderness  Genitourinary:  Thick white vaginal discharge.  Cervix is closed.  Cervix is tender.  Midline mass palpable.  Tenderness over left and right adnexa.   Musculoskeletal: She exhibits no edema.  Neurological: She is alert.  CN II-XII  intact, EOMs intact, no pronator drift, grip strengths equal bilaterally; strength 5/5 in all extremities, sensation intact in all extremities; finger to nose, heel to shin, rapid alternating movements normal.    Skin: She is not diaphoretic.    ED Course  Procedures (including critical care time) Labs Review Labs Reviewed  WET PREP, GENITAL - Abnormal; Notable for the following:    WBC, Wet Prep HPF POC MODERATE (*)    All other components within normal limits  CBC WITH DIFFERENTIAL - Abnormal; Notable for the following:    WBC 3.9 (*)    All other components within normal limits  COMPREHENSIVE METABOLIC PANEL - Abnormal; Notable for the following:    Potassium 3.5 (*)    Glucose, Bld 64 (*)    All other components within normal limits  URINALYSIS, ROUTINE W REFLEX MICROSCOPIC - Abnormal; Notable for the following:    APPearance CLOUDY (*)    All other components within normal limits  HCG, QUANTITATIVE, PREGNANCY - Abnormal; Notable for the following:    hCG, Beta Chain, Quant, S [id number] (*)    All other components within normal limits  GC/CHLAMYDIA PROBE AMP  HIV ANTIBODY (ROUTINE TESTING)  ABO/RH    Imaging Review [place] Ob Comp Less 14 Wks  10/01/2013   CLINICAL DATA:  Pregnant, cervical tenderness, possible mass on physical exam  EXAM: OBSTETRIC <14 WK US AND TRANSVAGINAL OB US  TECHNIQUE: Both transabdominal and transvaginal ultrasound examinations were performed for complete evaluation of the gestation as well as the maternal uterus, adnexal regions, and pelvic cul-de-sac. Transvaginal technique was performed to assess early pregnancy.  COMPARISON:  None.  FINDINGS: Intrauterine gestational sac: Visualized/normal in shape.  Yolk sac:  Present  Embryo:  Present  Cardiac Activity: Present  Heart Rate:  175 bpm  CRL:   16.4  mm   8 w 0 d                  US EDC: 05/13/2014  Maternal uterus/adnexae: No subchorionic hemorrhage.  Left ovary is within normal limits, measuring 1.1 x 3.2 x 1.6  cm.  Right ovary measures 2.7 x 3.8 x 1.9 cm and is notable for a 2.3 cm corpus luteal cyst.  No free fluid.  IMPRESSION: Single live intrauterine gestation with estimated gestational age [redacted] weeks 0 days by crown-rump length.   Electronically Signed   By: Charline BillsSriyesh  Krishnan M.D.   On: 10/01/2013 13:53   [place] Ob Transvaginal  10/01/2013   CLINICAL DATA:  Pregnant, cervical tenderness, possible mass on physical exam  EXAM: OBSTETRIC <14 WK US AND TRANSVAGINAL OB US  TECHNIQUE: Both transabdominal and transvaginal ultrasound examinations were performed for complete evaluation of the gestation as well as the maternal uterus, adnexal regions, and pelvic cul-de-sac. Transvaginal technique was performed to assess early pregnancy.  COMPARISON:  None.  FINDINGS: Intrauterine gestational sac: Visualized/normal in shape.  Yolk sac:  Present  Embryo:  Present  Cardiac Activity: Present  Heart Rate:  175 bpm  CRL:   16.4  mm   8 w 0 d                  US EDC: 05/13/2014  Maternal uterus/adnexae: No subchorionic hemorrhage.  Left ovary is within normal limits, measuring 1.1 x 3.2 x 1.6 cm.  Right ovary measures 2.7 x 3.8 x 1.9 cm and is notable for a 2.3 cm corpus luteal cyst.  No free fluid.  IMPRESSION: Single live intrauterine gestation with estimated gestational age [redacted] weeks 0 days by crown-rump length.   Electronically Signed   By: Charline BillsSriyesh  Krishnan M.D.   On: 10/01/2013 13:53     EKG Interpretation None      I have discussed with patient the safety of medications in pregnancy, the fact that our knowledge of drug safety in pregnancy is limited and that I can only make recommendations based on what is currently known at this time and the safety ratings given to medications.  We discussed that medications may pass through the placenta and have encouraged them to make their own decisions regarding the medications used in light of this information.   Patient states she is definitely having an abortion and is not at all  undecided about this - states that she would like to be given medications.    MDM   Final diagnoses:  Pregnant  Yeast infection  Headache    Pregnant pt is first trimester p/w multiple vague symptoms likely related to normal first trimester symptoms.  Also with abnormal vaginal discharge.  Clinically with yeast infection.  She does have cervical tenderness.  I recommended treated for GC/Chlam and patient declines.  She thinks that her PCP tested her for this but I do not see this in the records and told her this.  Pt states she has medication for yeast infection at home.  She does not intend to continue this pregnancy and therefore medication usage was not a concern for her.  Pt also with headache.  Neurologically intact.  No red flags.  D/C home with PCP follow up.  Discussed result, findings, treatment, and follow up  with patient.  Pt given return precautions.  Pt verbalizes understanding and agrees with plan.        Trixie Dredgemily Ekaterini Capitano, PA-C 10/01/13 1523

## 2013-10-01 NOTE — ED Notes (Signed)
Pt returned from US

## 2013-10-02 LAB — GC/CHLAMYDIA PROBE AMP
CT Probe RNA: NEGATIVE
GC Probe RNA: NEGATIVE

## 2013-10-02 NOTE — ED Provider Notes (Signed)
Medical screening examination/treatment/procedure(s) were conducted as a shared visit with non-physician practitioner(s) and myself.  I personally evaluated the patient during the encounter.   EKG Interpretation None      Pt c/o gradual onset mild frontal headache, dull. No neck pain or stiffness.  On exam, comfortable appearing. No sinus or temporal tenderness. No neck stiffness or rigidity.    Suzi RootsKevin E Kristy Catoe, MD 10/02/13 [phone]

## 2013-10-03 NOTE — [facility] Provider Note (Signed)
Attestation of Attending Supervision of Advanced Practitioner (PA/CNM/NP): Evaluation and management procedures were performed by the Advanced Practitioner under my supervision and collaboration.  I have reviewed the Advanced Practitioner's note and chart, and I agree with the management and plan.  Reva Boresanya S Ileta Ofarrell, MD [facility] Attending 10/03/2013 1:42 AM

## 2013-10-05 ENCOUNTER — Ambulatory Visit: Payer: No Typology Code available for payment source | Admitting: Emergency Medicine

## 2013-10-08 ENCOUNTER — Encounter: Payer: Self-pay | Admitting: Emergency Medicine

## 2013-10-08 ENCOUNTER — Ambulatory Visit (INDEPENDENT_AMBULATORY_CARE_PROVIDER_SITE_OTHER): Payer: Medicaid Other | Admitting: Emergency Medicine

## 2013-10-08 VITALS — BP 117/73 | HR 91 | Temp 99.4°F | Ht 67.0 in | Wt 105.0 lb

## 2013-10-08 DIAGNOSIS — IMO0001 Reserved for inherently not codable concepts without codable children: Secondary | ICD-10-CM

## 2013-10-08 DIAGNOSIS — E162 Hypoglycemia, unspecified: Secondary | ICD-10-CM

## 2013-10-08 DIAGNOSIS — R252 Cramp and spasm: Secondary | ICD-10-CM | POA: Insufficient documentation

## 2013-10-08 DIAGNOSIS — O039 Complete or unspecified spontaneous abortion without complication: Secondary | ICD-10-CM

## 2013-10-08 LAB — GLUCOSE, CAPILLARY: Glucose-Capillary: 76 mg/dL (ref 70–99)

## 2013-10-08 NOTE — Progress Notes (Signed)
   Subjective:    Patient ID: Courtney Adams, female    DOB: 05/26/1986, 28 y.o.   MRN: [id number]  HPI Courtney Adams is here for a SDA for low blood sugar.  She is here to day to f/u on a low blood sugar in the ER last week.  Since that time, she had a surgical abortion in [place] on 4/24.  She denies any tremors, sweating, confusion.    She also states that she has spasms in her hands and feet.  They last seconds to a minute.  Associated with pain.  These have been occuring for about 1 year, but are getting more frequent.   Current Outpatient Prescriptions on File Prior to Visit  Medication Sig Dispense Refill  . Prenatal Vit-Fe Fumarate-FA (PRENATAL MULTIVITAMIN) TABS tablet Take 1 tablet by mouth daily at 12 noon.       No current facility-administered medications on file prior to visit.    I have reviewed and updated the following as appropriate: allergies and current medications SHx: non smoker   Review of Systems See HPI    Objective:   Physical Exam BP 117/73  Pulse 91  Temp(Src) 99.4 F (37.4 C) (Oral)  Ht 5\' 7"  (1.702 m)  Wt 105 lb (47.628 kg)  BMI 16.44 kg/m2  LMP 08/11/2013 Gen: alert, cooperative, NAD HEENT: AT/[place], sclera white, MMM Neck: supple CV: RRR, no murmurs Pulm: CTAB, no wheezes or rales      Assessment & Plan:  Hypoglycemia: CBG of 64 last week in the ER.  No symptoms.  Today it is 7676.  No further f/u needed.

## 2013-10-08 NOTE — Patient Instructions (Signed)
It was nice to meet you!  I think the spasm you have in your hands and feet are related to a low potassium. We rechecked the pregnancy hormone today - I will call if anything is wrong.  Potassium Content of Foods Potassium is a mineral found in many foods and drinks. It helps keep fluids and minerals balanced in your body and also affects how steadily your heart beats. The body needs potassium to control blood pressure and to keep the muscles and nervous system healthy. However, certain health conditions and medicine may require you to eat more or less potassium-rich foods and drinks. Your caregiver or dietitian will tell you how much potassium you should have each day. COMMON SERVING SIZES The list below tells you how big or small common portion sizes are:  1 oz.........4 stacked dice.  3 oz........[name].Deck of cards.  1 tsp.......[name].Tip of little finger.  1 tbsp....[name].[name].Thumb.  2 tbsp....[name].[name].Golf ball.   c..........[name].Half of a fist.  1 c...........[name].A fist. FOODS AND DRINKS HIGH IN POTASSIUM More than 200 mg of potassium per serving. A serving size is  c (120 mL or noted gram weight) unless otherwise stated. While all the items on this list are high in potassium, some items are higher in potassium than others. Fruits  Apricots (sliced), 83 g.  Apricots (dried halves), 3 oz / 24 g.  Avocado (cubed),  c / 50 g.  Banana (sliced), 75 g.  Cantaloupe (cubed), 80 g.  Dates (pitted), 5 whole / 35 g.  Figs (dried), 4 whole / 32 g.  Guava, c / 55 g.  Honeydew, 1 wedge / 85 g.  Kiwi (sliced), 90 g.  Nectarine, 1 small / 129 g.  Orange, 1 medium / 131 g.  Orange juice.  Pomegranate seeds, 87 g.  Pomegranate juice.  Prunes (pitted), 3 whole / 30 g.  Prune juice, 3 oz / 90 mL.  Seedless raisins, 3 tbsp / 27 g. Vegetables  Artichoke,  of a medium / 64 g.  Asparagus (boiled), 90 g.  Baked beans,  c / 63 g.  Bamboo shoots,  c / 38 g.  Beets (cooked slices), 85  g.  Broccoli (boiled), 78 g.  Brussels sprout (boiled), 78 g.  Butternut squash (baked), 103 g.  Chickpea (cooked), 82 g.  Green peas (cooked), 80 g.  Hubbard squash (baked cubes),  c / 68 g.  Kidney beans (cooked), 5 tbsp / 55 g.  Lima beans (cooked),  c / 43 g.  Navy beans (cooked),  c / 61 g.  Potato (baked), 61 g.  Potato (boiled), 78 g.  Pumpkin (boiled), 123 g.  Refried beans,  c / 79 g.  Spinach (cooked),  c / 45 g.  Split peas (cooked),  c / 65 g.  Sun-dried tomatoes, 2 tbsp / 7 g.  Sweet potato (baked),  c / 50 g.  Tomato (chopped or sliced), 90 g.  Tomato juice.  Tomato paste, 4 tsp / 21 g.  Tomato sauce,  c / 61 g.  Vegetable juice.  White mushrooms (cooked), 78 g.  Yam (cooked or baked),  c / 34 g.  Zucchini squash (boiled), 90 g. Other Foods and Drinks  Almonds (whole),  c / 36 g.  Cashews (oil roasted),  c / 32 g.  Chocolate milk.  Chocolate pudding, 142 g.  Clams (steamed), 1.5 oz / 43 g.  Dark chocolate, 1.5 oz / 42 g.  Fish, 3 oz / 85 g.  King crab (  steamed), 3 oz / 85 g.  Lobster (steamed), 4 oz / 113 g.  Milk (skim, 1%, 2%, whole), 1 c / 240 mL.  Milk chocolate, 2.3 oz / 66 g.  Milk shake.  Nonfat fruit variety yogurt, 123 g.  Peanuts (oil roasted), 1 oz / 28 g.  Peanut butter, 2 tbsp / 32 g.  Pistachio nuts, 1 oz / 28 g.  Pumpkin seeds, 1 oz / 28 g.  Red meat (broiled, cooked, grilled), 3 oz / 85 g.  Scallops (steamed), 3 oz / 85 g.  Shredded wheat cereal (dry), 3 oblong biscuits / 75 g.  Spaghetti sauce,  c / 66 g.  Sunflower seeds (dry roasted), 1 oz / 28 g.  Veggie burger, 1 patty / 70 g. FOODS MODERATE IN POTASSIUM Between 150 mg and 200 mg per serving. A serving is  c (120 mL or noted gram weight) unless otherwise stated. Fruits  Grapefruit,  of the fruit / 123 g.  Grapefruit juice.  Pineapple juice.  Plums (sliced), 83 g.  Tangerine, 1 large / 120 g. Vegetables  Carrots  (boiled), 78 g.  Carrots (sliced), 61 g.  Rhubarb (cooked with sugar), 120 g.  R[place]abaga (cooked), 120 g.  Sweet corn (cooked), 75 g.  Yellow snap beans (cooked), 63 g. Other Foods and Drinks   Bagel, 1 bagel / 98 g.  Chicken breast (roasted and chopped),  c / 70 g.  Chocolate ice cream / 66 g.  Pita bread, 1 large / 64 g.  Shrimp (steamed), 4 oz / 113 g.  Swiss cheese (diced), 70 g.  Vanilla ice cream, 66 g.  Vanilla pudding, 140 g. FOODS LOW IN POTASSIUM Less than 150 mg per serving. A serving size is  cup (120 mL or noted gram weight) unless otherwise stated. If you eat more than 1 serving of a food low in potassium, the food may be considered a food high in potassium. Fruits  Apple (slices), 55 g.  Apple juice.  Applesauce, 122 g.  Blackberries, 72 g.  Blueberries, 74 g.  Cranberries, 50 g.  Cranberry juice.  Fruit cocktail, 119 g.  Fruit punch.  Grapes, 46 g.  Grape juice.  Mandarin oranges (canned), 126 g.  Peach (slices), 77 g.  Pineapple (chunks), 83 g.  Raspberries, 62 g.  Red cherries (without pits), 78 g.  Strawberries (sliced), 83 g.  Watermelon (diced), 76 g. Vegetables  Alfalfa sprouts, 17 g.  Bell peppers (sliced), 46 g.  Cabbage (shredded), 35 g.  Cauliflower (boiled), 62 g.  Celery, 51 g.  Collard greens (boiled), 95 g.  Cucumber (sliced), 52 g.  Eggplant (cubed), 41 g.  Green beans (boiled), 63 g.  Lettuce (shredded), 1 c / 36 g.  Onions (sauteed), 44 g.  Radishes (sliced), 58 g.  Spaghetti squash, 51 g. Other Foods and Drinks  [employer], 1 slice / 28 g.  Black tea.  Brown rice (cooked), 98 g.  Butter croissant, 1 medium / 57 g.  Carbonated soda.  Coffee.  Cheddar cheese (diced), 66 g.  Corn flake cereal (dry), 14 g.  Cottage cheese, 118 g.  Cream of rice cereal (cooked), 122 g.  Cream of wheat cereal (cooked), 126 g.  Crisped rice cereal (dry), 14 g.  Egg (boiled, fried,  poached, omelet, scrambled), 1 large / 46 61 g.  English muffin, 1 muffin / 57 g.  Frozen ice pop, 1 pop / 55 g.  Graham cracker, 1 large rectangular cracker /  14 g.  Jelly beans, 112 g.  Non-dairy whipped topping.  Oatmeal, 88 g.  Orange sherbet, 74 g.  Puffed rice cereal (dry), 7 g.  Pasta (cooked), 70 g.  Rice cakes, 4 cakes / 36 g.  Sugared doughnut, 4 oz / 116 g.  White bread, 1 slice / 30 g.  White rice (cooked), 79 93 g.  Wild rice (cooked), 82 g.  Yellow cake, 1 slice / 68 g. Document Released: 01/12/2005 Document Revised: 05/17/2012 Document Reviewed: 10/15/2011 [facility] Patient Information 2014 [place].

## 2013-10-08 NOTE — Assessment & Plan Note (Signed)
Suspect may be related to anxiety or low potassium. Calcium level is normal. Handout given on potassium rich foods.

## 2013-10-09 LAB — HCG, QUANTITATIVE, PREGNANCY: hCG, Beta Chain, Quant, S: 10056.1 m[IU]/mL

## 2013-10-19 ENCOUNTER — Encounter: Payer: Self-pay | Admitting: Sports Medicine

## 2013-10-19 ENCOUNTER — Ambulatory Visit (INDEPENDENT_AMBULATORY_CARE_PROVIDER_SITE_OTHER): Payer: Medicaid Other | Admitting: Sports Medicine

## 2013-10-19 VITALS — BP 92/53 | HR 114 | Temp 99.4°F | Ht 67.0 in | Wt 104.0 lb

## 2013-10-19 DIAGNOSIS — Z309 Encounter for contraceptive management, unspecified: Secondary | ICD-10-CM

## 2013-10-19 DIAGNOSIS — Z3490 Encounter for supervision of normal pregnancy, unspecified, unspecified trimester: Secondary | ICD-10-CM

## 2013-10-19 DIAGNOSIS — Z7251 High risk heterosexual behavior: Secondary | ICD-10-CM | POA: Insufficient documentation

## 2013-10-19 NOTE — Assessment & Plan Note (Signed)
Patient is completely asymptomatic at this time.  No indication for further testing given recent testing and same, asymptomatic partner. Patient counseled extensively on decreasing her risks and on warning signs and symptoms.  Encouraged followup if any concerns or worsening vaginal discharge, dysuria, frequency, abdominal pain, fevers or chills.

## 2013-10-19 NOTE — Progress Notes (Signed)
  Courtney Adams - 28 y.o. female MRN [id number]  Date of birth: 02/02/1986  CC: Sexual Problem   SUBJECTIVE:     HPI Comments: Patient presents with:   Sexual Problem - sexually active following [place] [place];  Patient underwent an elective abortion following diagnosis pregnancy approximately 3 weeks ago.  She is told not to be sexually active but ended up having unprotected intercourse earlier this week.  She denies any dysuria, frequency, hesitancy, vaginal discharge or abdominal pain.Pt denies any fevers, chills, or rigors.  Contraception: Patient reports not wanting chemical contraception do to adverse reactions to prior including headaches, menometrorrhagia and abdominal pain. See problem based charting for additional problem specific subjective (including HPI, Interval History & ROS)   HISTORY: History  Smoking status  . Former Smoker -- 0.20 packs/day  . Types: Cigarettes  Smokeless tobacco  . Never Used    Comment: 2 -3 cigs a day  Otherwise past Medical, Surgical, Social, and Family History Reviewed per EMR Medications and Allergies reviewed and updated per below.  OBJECTIVE: VITALS: BP: 92/53 mmHg  HR: 114 bpm  TEMP: 99.4 F (37.4 C) (Oral)  RESP:    HT: 5\' 7"  (170.2 cm)  WT: 104 lb (47.174 kg)  BMI: 16.3   BP Readings from Last 3 Encounters:  10/19/13 92/53  10/08/13 117/73  10/01/13 107/75    Wt Readings from Last 3 Encounters:  10/19/13 104 lb (47.174 kg)  10/08/13 105 lb (47.628 kg)  10/01/13 105 lb (47.628 kg)     Physical Exam  Vitals reviewed. Constitutional: She is well-developed, well-nourished, and in no distress.  HENT:  Head: Normocephalic and atraumatic.  Right Ear: External ear normal.  Left Ear: External ear normal.  Eyes: Right eye exhibits no discharge. Left eye exhibits no discharge.  Neck: No JVD present. No tracheal deviation present.  Pulmonary/Chest: Effort normal and breath sounds normal. No respiratory distress.  Musculoskeletal: She  exhibits no edema and no tenderness.  Neurological: She is alert.  Moves all 4 extremities spontaneously; no lateralization.  Skin: Skin is warm and dry. She is not diaphoretic.  Psychiatric: Mood, memory, affect and judgment normal.   MEDICATIONS, LABS & OTHER ORDERS: Previous Medications   PRENATAL VIT-FE FUMARATE-FA (PRENATAL MULTIVITAMIN) TABS TABLET    Take 1 tablet by mouth daily at 12 noon.   Modified Medications   No medications on file   New Prescriptions   No medications on file   Discontinued Medications   No medications on file  No orders of the defined types were placed in this encounter.   ASSESSMENT & PLAN: See problem based charting & AVS for pt instructions.

## 2013-10-19 NOTE — Assessment & Plan Note (Signed)
Elective abortion at the end of April.

## 2013-10-19 NOTE — Patient Instructions (Signed)
Sterilization Information, Female Female sterilization is a procedure to permanently prevent pregnancy. There are different ways to perform sterilization, but all either block or close the fallopian tubes so that your eggs cannot reach your uterus. If your egg cannot reach your uterus, sperm cannot fertilize the egg, and you cannot get pregnant.  Sterilization is performed by a surgical procedure. Sometimes these procedures are performed in a hospital while a patient is asleep. Sometimes they can be done in a clinic setting with the patient awake. The fallopian tubes can be surgically cut, tied, or sealed through a procedure called tubal ligation. The fallopian tubes can also be closed with clips or rings. Sterilization can also be done by placing a tiny coil into each fallopian tube, which causes scar tissue to grow inside the tube. The scar tissue then blocks the tubes.  Discuss sterilization with your caregiver to answer any concerns you or your partner may have. You may want to ask what type of sterilization your caregiver performs. Some caregivers may not perform all the various options. Sterilization is permanent and should only be done if you are sure you do not want children or do not want any more children. Having a sterilization reversed may not be successful.  STERILIZATION PROCEDURES  Laparoscopic sterilization. This is a surgical method performed at a time other than right after childbirth. Two incisions are made in the lower abdomen. A thin, lighted tube (laparoscope) is inserted into one of the incisions and is used to perform the procedure. The fallopian tubes are closed with a ring or a clip. An instrument that uses heat could be used to seal the tubes closed (electrocautery).   Mini-laparotomy. This is a surgical method done 1 or 2 days after giving birth. Typically, a small incision is made just below the belly button (umbilicus) and the fallopian tubes are exposed. The tubes can then be  sealed, tied, or cut.   Hysteroscopic sterilization. This is performed at a time other than right after childbirth. A tiny, spring-like coil is inserted through the cervix and uterus and placed into the fallopian tubes. The coil causes scaring and blocks the tubes. Other forms of contraception should be used for 3 months after the procedure to allow the scar tissue to form completely. Additionally, it is required hysterosalpingography be done 3 months later to ensure that the procedure was successful. Hysterosalpingography is a procedure that uses X-rays to look at your uterus and fallopian tubes after a material to make them show up better has been inserted. IS STERILIZATION SAFE? Sterilization is considered safe with very rare complications. Risks depend on the type of procedure you have. As with any surgical procedure, there are risks. Some risks of sterilization by any means include:   Bleeding.  Infection.  Reaction to anesthesia medicine.  Injury to surrounding organs. Risks specific to having hysteroscopic coils placed include:  The coils may not be placed correctly the first time.   The coils may move out of place.   The tubes may not get completely blocked after 3 months.   Injury to surrounding organs when placing the coil.  HOW EFFECTIVE IS FEMALE STERILIZATION? Sterilization is nearly 100% effective, but it can fail. Depending on the type of sterilization, the rate of failure can be as high as 3%. After hysteroscopic sterilization with placement of fallopian tube coils, you will need back-up birth control for 3 months after the procedure. Sterilization is effective for a lifetime.  BENEFITS OF STERILIZATION  It does   not affect your hormones, and therefore will not affect your menstrual periods, sexual desire, or performance.   It is effective for a lifetime.   It is safe.   You do not need to worry about getting pregnant. Keep in mind that if you had the  hysteroscopic placement procedure, you must wait 3 months after the procedure (or until your caregiver confirms) before pregnancy is not considered possible.   There are no side effects unlike other types of birth control (contraception).  DRAWBACKS OF STERILIZATION  You must be sure you do not want children or any more children. The procedure is permanent.   It does not provide protection against sexually transmitted infections (STIs).   The tubes can grow back together. If this happens, there is a risk of pregnancy. There is also an increased risk (50%) of pregnancy being an ectopic pregnancy. This is a pregnancy that happens outside of the uterus. Document Released: 11/17/2007 Document Revised: 11/30/2011 Document Reviewed: 09/16/2011 [facility] Patient Information 2014 [place].   Levonorgestrel intrauterine device (IUD) What is this medicine? LEVONORGESTREL IUD (LEE voe nor jes trel) is a contraceptive (birth control) device. The device is placed inside the uterus by a healthcare professional. It is used to prevent pregnancy and can also be used to treat heavy bleeding that occurs during your period. Depending on the device, it can be used for 3 to 5 years. This medicine may be used for other purposes; ask your health care provider or pharmacist if you have questions. COMMON BRAND NAME(S): Gretta CoolMirena, Skyla What should I tell my health care provider before I take this medicine? They need to know if you have any of these conditions: -abnormal Pap smear -cancer of the breast, uterus, or cervix -diabetes -endometritis -genital or pelvic infection now or in the past -have more than one sexual partner or your partner has more than one partner -heart disease -history of an ectopic or tubal pregnancy -immune system problems -IUD in place -liver disease or tumor -problems with blood clots or take blood-thinners -use intravenous drugs -uterus of unusual shape -vaginal bleeding  that has not been explained -an unusual or allergic reaction to levonorgestrel, other hormones, silicone, or polyethylene, medicines, foods, dyes, or preservatives -pregnant or trying to get pregnant -breast-feeding How should I use this medicine? This device is placed inside the uterus by a health care professional. Talk to your pediatrician regarding the use of this medicine in children. Special care may be needed. Overdosage: If you think you have taken too much of this medicine contact a poison control center or emergency room at once. NOTE: This medicine is only for you. Do not share this medicine with others. What if I miss a dose? This does not apply. What may interact with this medicine? Do not take this medicine with any of the following medications: -amprenavir -bosentan -fosamprenavir This medicine may also interact with the following medications: -aprepitant -barbiturate medicines for inducing sleep or treating seizures -bexarotene -griseofulvin -medicines to treat seizures like carbamazepine, ethotoin, felbamate, oxcarbazepine, phenytoin, topiramate -modafinil -pioglitazone -rifabutin -rifampin -rifapentine -some medicines to treat HIV infection like atazanavir, indinavir, lopinavir, nelfinavir, tipranavir, ritonavir -St. John's wort -warfarin This list may not describe all possible interactions. Give your health care provider a list of all the medicines, herbs, non-prescription drugs, or dietary supplements you use. Also tell them if you smoke, drink alcohol, or use illegal drugs. Some items may interact with your medicine. What should I watch for while using this medicine? Visit your  doctor or health care professional for regular check ups. See your doctor if you or your partner has sexual contact with others, becomes HIV positive, or gets a sexual transmitted disease. This product does not protect you against HIV infection (AIDS) or other sexually transmitted  diseases. You can check the placement of the IUD yourself by reaching up to the top of your vagina with clean fingers to feel the threads. Do not pull on the threads. It is a good habit to check placement after each menstrual period. Call your doctor right away if you feel more of the IUD than just the threads or if you cannot feel the threads at all. The IUD may come out by itself. You may become pregnant if the device comes out. If you notice that the IUD has come out use a backup birth control method like condoms and call your health care provider. Using tampons will not change the position of the IUD and are okay to use during your period. What side effects may I notice from receiving this medicine? Side effects that you should report to your doctor or health care professional as soon as possible: -allergic reactions like skin rash, itching or hives, swelling of the face, lips, or tongue -fever, flu-like symptoms -genital sores -high blood pressure -no menstrual period for 6 weeks during use -pain, swelling, warmth in the leg -pelvic pain or tenderness -severe or sudden headache -signs of pregnancy -stomach cramping -sudden shortness of breath -trouble with balance, talking, or walking -unusual vaginal bleeding, discharge -yellowing of the eyes or skin Side effects that usually do not require medical attention (report to your doctor or health care professional if they continue or are bothersome): -acne -breast pain -change in sex drive or performance -changes in weight -cramping, dizziness, or faintness while the device is being inserted -headache -irregular menstrual bleeding within first 3 to 6 months of use -nausea This list may not describe all possible side effects. Call your doctor for medical advice about side effects. You may report side effects to FDA at 1-800-FDA-1088. Where should I keep my medicine? This does not apply. NOTE: This sheet is a summary. It may not cover  all possible information. If you have questions about this medicine, talk to your doctor, pharmacist, or health care provider.  2014, [employer]. (2011-07-01 13:54:04)

## 2013-10-19 NOTE — Assessment & Plan Note (Signed)
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 - She's had issues with long-term reversible contraception including Nexplanon.  She is resistant to any chemical contraception at this time. - Patient is adamant she does not wish for future children 1. Counseled on options including Mirena and tubal ligation.  Patient elected to use condoms at this time however is considering tubal ligation. > Encourage patient to followup with OB/GYN, previously seen by Dr. Gaynell FaceMarshall, to further discuss elective tubal ligation.  .[name]

## 2013-10-29 ENCOUNTER — Encounter (HOSPITAL_COMMUNITY): Payer: Self-pay | Admitting: Emergency Medicine

## 2013-10-29 ENCOUNTER — Emergency Department (HOSPITAL_COMMUNITY)
Admission: EM | Admit: 2013-10-29 | Discharge: 2013-10-29 | Disposition: A | Discharge status: Home / Self Care | Payer: Medicaid Other | Attending: Emergency Medicine | Admitting: Emergency Medicine

## 2013-10-29 DIAGNOSIS — S139XXA Sprain of joints and ligaments of unspecified parts of neck, initial encounter: Secondary | ICD-10-CM | POA: Insufficient documentation

## 2013-10-29 DIAGNOSIS — Z8619 Personal history of other infectious and parasitic diseases: Secondary | ICD-10-CM | POA: Insufficient documentation

## 2013-10-29 DIAGNOSIS — Y92410 Unspecified street and highway as the place of occurrence of the external cause: Secondary | ICD-10-CM | POA: Insufficient documentation

## 2013-10-29 DIAGNOSIS — S161XXA Strain of muscle, fascia and tendon at neck level, initial encounter: Secondary | ICD-10-CM

## 2013-10-29 DIAGNOSIS — Y9389 Activity, other specified: Secondary | ICD-10-CM | POA: Insufficient documentation

## 2013-10-29 DIAGNOSIS — F17200 Nicotine dependence, unspecified, uncomplicated: Secondary | ICD-10-CM | POA: Insufficient documentation

## 2013-10-29 DIAGNOSIS — Z8742 Personal history of other diseases of the female genital tract: Secondary | ICD-10-CM | POA: Insufficient documentation

## 2013-10-29 DIAGNOSIS — S335XXA Sprain of ligaments of lumbar spine, initial encounter: Secondary | ICD-10-CM | POA: Insufficient documentation

## 2013-10-29 DIAGNOSIS — D649 Anemia, unspecified: Secondary | ICD-10-CM | POA: Insufficient documentation

## 2013-10-29 DIAGNOSIS — S0990XA Unspecified injury of head, initial encounter: Secondary | ICD-10-CM | POA: Insufficient documentation

## 2013-10-29 DIAGNOSIS — Z79899 Other long term (current) drug therapy: Secondary | ICD-10-CM | POA: Insufficient documentation

## 2013-10-29 DIAGNOSIS — S39012A Strain of muscle, fascia and tendon of lower back, initial encounter: Secondary | ICD-10-CM

## 2013-10-29 DIAGNOSIS — Z8659 Personal history of other mental and behavioral disorders: Secondary | ICD-10-CM | POA: Insufficient documentation

## 2013-10-29 MED ORDER — IBUPROFEN 800 MG PO TABS: 800.0000 mg | ORAL_TABLET | Freq: Three times a day (TID) | ORAL | Status: DC

## 2013-10-29 MED ORDER — CYCLOBENZAPRINE HCL 10 MG PO TABS: 10.0000 mg | ORAL_TABLET | Freq: Two times a day (BID) | ORAL | Status: DC | PRN
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
## 2013-10-29 NOTE — ED Provider Notes (Signed)
Medical screening examination/treatment/procedure(s) were performed by non-physician practitioner and as supervising physician I was immediately available for consultation/collaboration.   EKG Interpretation None        Dagmar HaitWilliam Lema Heinkel, MD 10/29/13 2303
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
## 2013-10-29 NOTE — Discharge Instructions (Signed)
Cervical Strain and Sprain (Whiplash) °with Rehab °Cervical strain and sprains are injuries that commonly occur with "whiplash" injuries. Whiplash occurs when the neck is forcefully whipped backward or forward, such as during a motor vehicle accident. The muscles, ligaments, tendons, discs and nerves of the neck are susceptible to injury when this occurs. °SYMPTOMS  °· Pain or stiffness in the front and/or back of neck °· Symptoms may present immediately or up to 24 hours after injury. °· Dizziness, headache, nausea and vomiting. °· Muscle spasm with soreness and stiffness in the neck. °· Tenderness and swelling at the injury site. °CAUSES  °Whiplash injuries often occur during contact sports or motor vehicle accidents.  °RISK INCREASES WITH: °· Osteoarthritis of the spine. °· Situations that make head or neck accidents or trauma more likely. °· High-risk sports (football, rugby, wrestling, hockey, auto racing, gymnastics, diving, contact karate or boxing). °· Poor strength and flexibility of the neck. °· Previous neck injury. °· Poor tackling technique. °· Improperly fitted or padded equipment. °PREVENTION °· Learn and use proper technique (avoid tackling with the head, spearing and head-butting; use proper falling techniques to avoid landing on the head). °· Warm up and stretch properly before activity. °· Maintain physical fitness: °· Strength, flexibility and endurance. °· Cardiovascular fitness. °· Wear properly fitted and padded protective equipment, such as padded soft collars, for participation in contact sports. °PROGNOSIS  °Recovery for cervical strain and sprain injuries is dependent on the extent of the injury. These injuries are usually curable in 1 week to 3 months with appropriate treatment.  °RELATED COMPLICATIONS  °· Temporary numbness and weakness may occur if the nerve roots are damaged, and this may persist until the nerve has completely healed. °· Chronic pain due to frequent recurrence of  symptoms. °· Prolonged healing, especially if activity is resumed too soon (before complete recovery). °TREATMENT  °Treatment initially involves the use of ice and medication to help reduce pain and inflammation. It is also important to perform strengthening and stretching exercises and modify activities that worsen symptoms so the injury does not get worse. These exercises may be performed at home or with a therapist. For patients who experience severe symptoms, a soft padded collar may be recommended to be worn around the neck.  °Improving your posture may help reduce symptoms. Posture improvement includes pulling your chin and abdomen in while sitting or standing. If you are sitting, sit in a firm chair with your buttocks against the back of the chair. While sleeping, try replacing your pillow with a small towel rolled to 2 inches in diameter, or use a cervical pillow or soft cervical collar. Poor sleeping positions delay healing.  °For patients with nerve root damage, which causes numbness or weakness, the use of a cervical traction apparatus may be recommended. Surgery is rarely necessary for these injuries. However, cervical strain and sprains that are present at birth (congenital) may require surgery. °MEDICATION  °· If pain medication is necessary, nonsteroidal anti-inflammatory medications, such as aspirin and ibuprofen, or other minor pain relievers, such as acetaminophen, are often recommended. °· Do not take pain medication for 7 days before surgery. °· Prescription pain relievers may be given if deemed necessary by your caregiver. Use only as directed and only as much as you need. °HEAT AND COLD:  °· Cold treatment (icing) relieves pain and reduces inflammation. Cold treatment should be applied for 10 to 15 minutes every 2 to 3 hours for inflammation and pain and immediately after any activity that   aggravates your symptoms. Use ice packs or an ice massage. °· Heat treatment may be used prior to  performing the stretching and strengthening activities prescribed by your caregiver, physical therapist, or athletic trainer. Use a heat pack or a warm soak. °SEEK MEDICAL CARE IF:  °· Symptoms get worse or do not improve in 2 weeks despite treatment. °· New, unexplained symptoms develop (drugs used in treatment may produce side effects). °EXERCISES °RANGE OF MOTION (ROM) AND STRETCHING EXERCISES - Cervical Strain and Sprain °These exercises may help you when beginning to rehabilitate your injury. In order to successfully resolve your symptoms, you must improve your posture. These exercises are designed to help reduce the forward-head and rounded-shoulder posture which contributes to this condition. Your symptoms may resolve with or without further involvement from your physician, physical therapist or athletic trainer. While completing these exercises, remember:  °· Restoring tissue flexibility helps normal motion to return to the joints. This allows healthier, less painful movement and activity. °· An effective stretch should be held for at least 20 seconds, although you may need to begin with shorter hold times for comfort. °· A stretch should never be painful. You should only feel a gentle lengthening or release in the stretched tissue. °STRETCH- Axial Extensors °· Lie on your back on the floor. You may bend your knees for comfort. Place a rolled up hand towel or dish towel, about 2 inches in diameter, under the part of your head that makes contact with the floor. °· Gently tuck your chin, as if trying to make a "double chin," until you feel a gentle stretch at the base of your head. °· Hold __________ seconds. °Repeat __________ times. Complete this exercise __________ times per day.  °STRETECH - Axial Extension  °· Stand or sit on a firm surface. Assume a good posture: chest up, shoulders drawn back, abdominal muscles slightly tense, knees unlocked (if standing) and feet hip width apart. °· Slowly retract your  chin so your head slides back and your chin slightly lowers.Continue to look straight ahead. °· You should feel a gentle stretch in the back of your head. Be certain not to feel an aggressive stretch since this can cause headaches later. °· Hold for __________ seconds. °Repeat __________ times. Complete this exercise __________ times per day. °STRETCH  Cervical Side Bend  °· Stand or sit on a firm surface. Assume a good posture: chest up, shoulders drawn back, abdominal muscles slightly tense, knees unlocked (if standing) and feet hip width apart. °· Without letting your nose or shoulders move, slowly tip your right / left ear to your shoulder until your feel a gentle stretch in the muscles on the opposite side of your neck. °· Hold __________ seconds. °Repeat __________ times. Complete this exercise __________ times per day. °STRETCH  Cervical Rotators  °· Stand or sit on a firm surface. Assume a good posture: chest up, shoulders drawn back, abdominal muscles slightly tense, knees unlocked (if standing) and feet hip width apart. °· Keeping your eyes level with the ground, slowly turn your head until you feel a gentle stretch along the back and opposite side of your neck. °· Hold __________ seconds. °Repeat __________ times. Complete this exercise __________ times per day. °RANGE OF MOTION - Neck Circles  °· Stand or sit on a firm surface. Assume a good posture: chest up, shoulders drawn back, abdominal muscles slightly tense, knees unlocked (if standing) and feet hip width apart. °· Gently roll your head down and around from   the back of one shoulder to the back of the other. The motion should never be forced or painful. °· Repeat the motion 10-20 times, or until you feel the neck muscles relax and loosen. °Repeat __________ times. Complete the exercise __________ times per day. °STRENGTHENING EXERCISES - Cervical Strain and Sprain °These exercises may help you when beginning to rehabilitate your injury. They may  resolve your symptoms with or without further involvement from your physician, physical therapist or athletic trainer. While completing these exercises, remember:  °· Muscles can gain both the endurance and the strength needed for everyday activities through controlled exercises. °· Complete these exercises as instructed by your physician, physical therapist or athletic trainer. Progress the resistance and repetitions only as guided. °· You may experience muscle soreness or fatigue, but the pain or discomfort you are trying to eliminate should never worsen during these exercises. If this pain does worsen, stop and make certain you are following the directions exactly. If the pain is still present after adjustments, discontinue the exercise until you can discuss the trouble with your clinician. °STRENGTH Cervical Flexors, Isometric °· Face a wall, standing about 6 inches away. Place a small pillow, a ball about 6-8 inches in diameter, or a folded towel between your forehead and the wall. °· Slightly tuck your chin and gently push your forehead into the soft object. Push only with mild to moderate intensity, building up tension gradually. Keep your jaw and forehead relaxed. °· Hold 10 to 20 seconds. Keep your breathing relaxed. °· Release the tension slowly. Relax your neck muscles completely before you start the next repetition. °Repeat __________ times. Complete this exercise __________ times per day. °STRENGTH- Cervical Lateral Flexors, Isometric  °· Stand about 6 inches away from a wall. Place a small pillow, a ball about 6-8 inches in diameter, or a folded towel between the side of your head and the wall. °· Slightly tuck your chin and gently tilt your head into the soft object. Push only with mild to moderate intensity, building up tension gradually. Keep your jaw and forehead relaxed. °· Hold 10 to 20 seconds. Keep your breathing relaxed. °· Release the tension slowly. Relax your neck muscles completely before  you start the next repetition. °Repeat __________ times. Complete this exercise __________ times per day. °STRENGTH  Cervical Extensors, Isometric  °· Stand about 6 inches away from a wall. Place a small pillow, a ball about 6-8 inches in diameter, or a folded towel between the back of your head and the wall. °· Slightly tuck your chin and gently tilt your head back into the soft object. Push only with mild to moderate intensity, building up tension gradually. Keep your jaw and forehead relaxed. °· Hold 10 to 20 seconds. Keep your breathing relaxed. °· Release the tension slowly. Relax your neck muscles completely before you start the next repetition. °Repeat __________ times. Complete this exercise __________ times per day. °POSTURE AND BODY MECHANICS CONSIDERATIONS - Cervical Strain and Sprain °Keeping correct posture when sitting, standing or completing your activities will reduce the stress put on different body tissues, allowing injured tissues a chance to heal and limiting painful experiences. The following are general guidelines for improved posture. Your physician or physical therapist will provide you with any instructions specific to your needs. While reading these guidelines, remember: °· The exercises prescribed by your provider will help you have the flexibility and strength to maintain correct postures. °· The correct posture provides the optimal environment for your joints to   work. All of your joints have less wear and tear when properly supported by a spine with good posture. This means you will experience a healthier, less painful body. °· Correct posture must be practiced with all of your activities, especially prolonged sitting and standing. Correct posture is as important when doing repetitive low-stress activities (typing) as it is when doing a single heavy-load activity (lifting). °PROLONGED STANDING WHILE SLIGHTLY LEANING FORWARD °When completing a task that requires you to lean forward while  standing in one place for a long time, place either foot up on a stationary 2-4 inch high object to help maintain the best posture. When both feet are on the ground, the low back tends to lose its slight inward curve. If this curve flattens (or becomes too large), then the back and your other joints will experience too much stress, fatigue more quickly and can cause pain.  °RESTING POSITIONS °Consider which positions are most painful for you when choosing a resting position. If you have pain with flexion-based activities (sitting, bending, stooping, squatting), choose a position that allows you to rest in a less flexed posture. You would want to avoid curling into a fetal position on your side. If your pain worsens with extension-based activities (prolonged standing, working overhead), avoid resting in an extended position such as sleeping on your stomach. Most people will find more comfort when they rest with their spine in a more neutral position, neither too rounded nor too arched. Lying on a non-sagging bed on your side with a pillow between your knees, or on your back with a pillow under your knees will often provide some relief. Keep in mind, being in any one position for a prolonged period of time, no matter how correct your posture, can still lead to stiffness. °WALKING °Walk with an upright posture. Your ears, shoulders and hips should all line-up. °OFFICE WORK °When working at a desk, create an environment that supports good, upright posture. Without extra support, muscles fatigue and lead to excessive strain on joints and other tissues. °CHAIR: °· A chair should be able to slide under your desk when your back makes contact with the back of the chair. This allows you to work closely. °· The chair's height should allow your eyes to be level with the upper part of your monitor and your hands to be slightly lower than your elbows. °· Body position: °· Your feet should make contact with the floor. If this is  not possible, use a foot rest. °· Keep your ears over your shoulders. This will reduce stress on your neck and low back. °Document Released: 05/31/2005 Document Revised: 09/25/2012 Document Reviewed: 09/12/2008 °[employer]® Patient Information ©2014 [employer]. °Motor Vehicle Collision  °It is common to have multiple bruises and sore muscles after a motor vehicle collision (MVC). These tend to feel worse for the first 24 hours. You may have the most stiffness and soreness over the first several hours. You may also feel worse when you wake up the first morning after your collision. After this point, you will usually begin to improve with each day. The speed of improvement often depends on the severity of the collision, the number of injuries, and the location and nature of these injuries. °HOME CARE INSTRUCTIONS  °· Put ice on the injured area. °· Put ice in a plastic bag. °· Place a towel between your skin and the bag. °· Leave the ice on for 15-20 minutes, 03-04 times a day. °· Drink enough fluids to   keep your urine clear or pale yellow. Do not drink alcohol. °· Take a warm shower or bath once or twice a day. This will increase blood flow to sore muscles. °· You may return to activities as directed by your caregiver. Be careful when lifting, as this may aggravate neck or back pain. °· Only take over-the-counter or prescription medicines for pain, discomfort, or fever as directed by your caregiver. Do not use aspirin. This may increase bruising and bleeding. °SEEK IMMEDIATE MEDICAL CARE IF: °· You have numbness, tingling, or weakness in the arms or legs. °· You develop severe headaches not relieved with medicine. °· You have severe neck pain, especially tenderness in the middle of the back of your neck. °· You have changes in bowel or bladder control. °· There is increasing pain in any area of the body. °· You have shortness of breath, lightheadedness, dizziness, or fainting. °· You have chest pain. °· You feel  sick to your stomach (nauseous), throw up (vomit), or sweat. °· You have increasing abdominal discomfort. °· There is blood in your urine, stool, or vomit. °· You have pain in your shoulder (shoulder strap areas). °· You feel your symptoms are getting worse. °MAKE SURE YOU:  °· Understand these instructions. °· Will watch your condition. °· Will get help right away if you are not doing well or get worse. °Document Released: 05/31/2005 Document Revised: 08/23/2011 Document Reviewed: 10/28/2010 °[employer]® Patient Information ©2014 [employer]. ° °

## 2013-10-30 ENCOUNTER — Ambulatory Visit: Payer: Medicaid Other | Admitting: Family Medicine

## 2013-12-12 ENCOUNTER — Emergency Department (HOSPITAL_COMMUNITY)
Admission: EM | Admit: 2013-12-12 | Discharge: 2013-12-12 | Discharge status: Against Medical Advice | Payer: Medicaid Other | Attending: Emergency Medicine | Admitting: Emergency Medicine

## 2013-12-12 ENCOUNTER — Telehealth: Payer: Self-pay | Admitting: Family Medicine

## 2013-12-12 ENCOUNTER — Encounter (HOSPITAL_COMMUNITY): Payer: Self-pay | Admitting: Emergency Medicine

## 2013-12-12 DIAGNOSIS — N898 Other specified noninflammatory disorders of vagina: Secondary | ICD-10-CM | POA: Insufficient documentation

## 2013-12-12 HISTORY — DX: Reserved for inherently not codable concepts without codable children: IMO0001

## 2013-12-12 HISTORY — DX: Complete or unspecified spontaneous abortion without complication: O03.9

## 2013-12-12 LAB — URINALYSIS, ROUTINE W REFLEX MICROSCOPIC
Bilirubin Urine: NEGATIVE
Glucose, UA: NEGATIVE mg/dL
Hgb urine dipstick: NEGATIVE
Ketones, ur: NEGATIVE mg/dL
Nitrite: NEGATIVE
Protein, ur: NEGATIVE mg/dL
Specific Gravity, Urine: 1.02 (ref 1.005–1.030)
Urobilinogen, UA: 0.2 mg/dL (ref 0.0–1.0)
pH: 6 (ref 5.0–8.0)

## 2013-12-12 LAB — PREGNANCY, URINE: Preg Test, Ur: NEGATIVE

## 2013-12-12 LAB — URINE MICROSCOPIC-ADD ON

## 2013-12-12 NOTE — Telephone Encounter (Signed)
Returned phone call on emergency line from Courtney Adams at 8:30pm.   She complains of itching and burning around her vagina x 1week. Large white chunks are produced and there is a foul odor. She denies fevers and abdominal pain. She has a history of STI's and similar symptoms. She believes it is a yeast infection but is much more severe than any symptoms she has ever had. She is in a sexually active relationship with one female with intermittent condom use.   I attempted to schedule an appointment tomorrow morning for her, but [facility] She was advised to call the clinic at [phone] anytime after 8:30am and to proceed to the emergency room if she experiences fevers or abdominal pain. She reported that symptoms were severe and that she would be going to the emergency room.   Mckenzi Buonomo B. Jarvis NewcomerGrunz, MD, PGY-2 12/12/2013 8:42 PM

## 2013-12-12 NOTE — ED Notes (Signed)
Pt. reports vaginal discharge with itching onset 1 week ago .

## 2013-12-12 NOTE — ED Notes (Signed)
Patient not wanting to wait any longer because "you are all taking too long and it shouldn't take 3 hours to see me".

## 2013-12-19 ENCOUNTER — Encounter: Payer: Self-pay | Admitting: Family Medicine

## 2013-12-19 ENCOUNTER — Ambulatory Visit (INDEPENDENT_AMBULATORY_CARE_PROVIDER_SITE_OTHER): Payer: Medicaid Other | Admitting: Family Medicine

## 2013-12-19 ENCOUNTER — Other Ambulatory Visit (HOSPITAL_COMMUNITY)
Admission: RE | Admit: 2013-12-19 | Discharge: 2013-12-19 | Disposition: A | Discharge status: Home / Self Care | Payer: Medicaid Other | Source: Ambulatory Visit | Attending: Family Medicine | Admitting: Family Medicine

## 2013-12-19 VITALS — BP 101/85 | HR 90 | Temp 99.1°F | Wt 102.0 lb

## 2013-12-19 DIAGNOSIS — N898 Other specified noninflammatory disorders of vagina: Secondary | ICD-10-CM

## 2013-12-19 DIAGNOSIS — Z113 Encounter for screening for infections with a predominantly sexual mode of transmission: Secondary | ICD-10-CM | POA: Insufficient documentation

## 2013-12-19 DIAGNOSIS — Z7251 High risk heterosexual behavior: Secondary | ICD-10-CM

## 2013-12-19 LAB — POCT WET PREP (WET MOUNT): Clue Cells Wet Prep Whiff POC: NEGATIVE

## 2013-12-19 NOTE — Progress Notes (Signed)
   Subjective:    Patient ID: Courtney Adams, female    DOB: 08/28/1985, 28 y.o.   MRN: [id number]  HPI  Courtney Adams is here for vaginal discharge.  VAGINAL DISCHARGE  Onset: 2 weeks ago  Description: yellow dc Odor: no  Itching: no  Symptoms Dysuria: no  Bleeding: no  Pelvic pain: no  Back pain: yes, MSK in nature   Fever: no  Genital sores: no  Rash: no  Dyspareunia: no  GI Sxs: no  Prior treatment: yes, reports having a year supply flagyl and diflucan. Took diflucan on Saturday but feels it didn't help.    Red Flags: Missed period: no  Pregnancy: no  Recent antibiotics: yes  Sexual activity: yes, two days ago.   Possible STD exposure: no  IUD: no  Diabetes: no     Current Outpatient Prescriptions on File Prior to Visit  Medication Sig Dispense Refill  . cyclobenzaprine (FLEXERIL) 10 MG tablet Take 1 tablet (10 mg total) by mouth 2 (two) times daily as needed for muscle spasms.  20 tablet  0  . ibuprofen (ADVIL,MOTRIN) 800 MG tablet Take 1 tablet (800 mg total) by mouth 3 (three) times daily.  21 tablet  0  . Prenatal Vit-Fe Fumarate-FA (PRENATAL MULTIVITAMIN) TABS tablet Take 1 tablet by mouth daily at 12 noon.       No current facility-administered medications on file prior to visit.   Review of Systems See HPI     Objective:   Physical Exam BP 101/85  Pulse 90  Temp(Src) 99.1 F (37.3 C) (Oral)  Wt 102 lb (46.267 kg)  LMP 11/26/2013 GU: > External: no lesions > Vagina: no blood in vault > Cervix: no lesion; white thin d/c;       Assessment & Plan:

## 2013-12-19 NOTE — Assessment & Plan Note (Signed)
The patient recently had unprotected intercourse. She doesn't know the STD status of her partner - GC/Chlamydia: pending.

## 2013-12-19 NOTE — Patient Instructions (Signed)
Thank you for coming in,   I will call you with the results from today's labs. If there is any indication for a treatment then I will send it in.   I would recommend in using a form of protection for intercourse. This is to avoid any form of sexually transmitted diseases.    Please feel free to call with any questions or concerns at any time, at [phone]. --Dr. Jordan LikesSchmitz

## 2013-12-19 NOTE — Assessment & Plan Note (Signed)
She is with recurrent episodes of vulvovaginosis and BV. Dr. Lloyd HugerNeil saw her in December of 2014 and gave her Diflucan and Flagyl prescriptions to take on a monthly basis for which she has yeast infections after her cycle. She has taken the Diflucan which does not seem to be helping.  - wet prep: pending  - will treat based on these results.

## 2013-12-21 ENCOUNTER — Telehealth: Payer: Self-pay | Admitting: Family Medicine

## 2013-12-21 NOTE — Telephone Encounter (Signed)
Message copied by Farrell OursEVANS, Sylwia Cuervo K on Fri Dec 21, 2013  3:20 PM ------      Message from: Myra RudeSCHMITZ, JEREMY E      Created: Fri Dec 21, 2013 12:28 PM       Please call patient and inform her that her Wet prep and GC/Chlamydia were negative for any infections. Thank you. ------

## 2013-12-21 NOTE — Telephone Encounter (Signed)
Spoke with patient and informed her of below 

## 2013-12-21 NOTE — Telephone Encounter (Signed)
Patient would like lab results from [facility] 12/19/13. Seen by Dr. Jordan LikesSchmitz.

## 2014-01-03 ENCOUNTER — Ambulatory Visit (INDEPENDENT_AMBULATORY_CARE_PROVIDER_SITE_OTHER): Payer: No Typology Code available for payment source | Admitting: Family Medicine

## 2014-01-03 ENCOUNTER — Encounter: Payer: Self-pay | Admitting: Family Medicine

## 2014-01-03 VITALS — BP 113/75 | HR 80 | Temp 98.6°F | Wt 102.0 lb

## 2014-01-03 DIAGNOSIS — L03317 Cellulitis of buttock: Secondary | ICD-10-CM

## 2014-01-03 DIAGNOSIS — L0231 Cutaneous abscess of buttock: Secondary | ICD-10-CM

## 2014-01-03 MED ORDER — FLUCONAZOLE 150 MG PO TABS: 150.0000 mg | ORAL_TABLET | Freq: Once | ORAL | Status: DC

## 2014-01-03 MED ORDER — CLINDAMYCIN HCL 300 MG PO CAPS: 300.0000 mg | ORAL_CAPSULE | Freq: Four times a day (QID) | ORAL | Status: DC

## 2014-01-03 NOTE — Patient Instructions (Signed)
It was nice to meet you today!  Do warm water baths this weekend. Keep the area gently covered when not in the bath. Take clindamycin four times daily for 7 days. Return on Monday for a recheck, appointment at 4:00pm with Dr. Casper HarrisonStreet.  Go to urgent care or ER this weekend if you have fevers, nausea/vomiting, worsening pain, or other concerns.   Incision and Drainage Care After Refer to this sheet in the next few weeks. These instructions provide you with information on caring for yourself after your procedure. Your caregiver may also give you more specific instructions. Your treatment has been planned according to current medical practices, but problems sometimes occur. Call your caregiver if you have any problems or questions after your procedure. HOME CARE INSTRUCTIONS   If antibiotic medicine is given, take it as directed. Finish it even if you start to feel better.  Only take over-the-counter or prescription medicines for pain, discomfort, or fever as directed by your caregiver.  Keep all follow-up appointments as directed by your caregiver.  Change any bandages (dressings) as directed by your caregiver. Replace old dressings with clean dressings.  Wash your hands before and after caring for your wound. You will receive specific instructions for cleansing and caring for your wound.  SEEK MEDICAL CARE IF:   You have increased pain, swelling, or redness around the wound.  You have increased drainage, smell, or bleeding from the wound.  You have muscle aches, chills, or you feel generally sick.  You have a fever. MAKE SURE YOU:   Understand these instructions.  Will watch your condition.  Will get help right away if you are not doing well or get worse. Document Released: 08/23/2011 Document Reviewed: 08/23/2011 [facility] Patient Information 2015 [place], [place]. This information is not intended to replace advice given to you by your health care provider. Make sure you discuss  any questions you have with your health care provider.   Be well, Dr. Pollie MeyerMcIntyre

## 2014-01-03 NOTE — Progress Notes (Signed)
Patient ID: Courtney Adams, female   DOB: 05/16/1986, 28 y.o.   MRN: [id number]  HPI:  Pt presents for a same day appointment to discuss right buttock abscess.  Has had pain and swelling on R buttock for the last few days. No fevers, nausea/vomiting. Eating and drinking normally. Has also had pain to anterior R groin for the same amount of time. It had not drained prior today, but once she sat down on the clinic table today after arriving, it spontaneously opened and started bleeding. Has never had abscesses before.  Of note was recently seen for vaginal discharge and had negative STD testing and wet prep. She endorses continued vaginal discharge without itching. Requests diflucan to be treated for yeast.   ROS: See HPI  PMFSH: hx mood disorder, recurrent BV  PHYSICAL EXAM: BP 113/75  Pulse 80  Temp(Src) 98.6 F (37 C) (Oral)  Wt 102 lb (46.267 kg)  LMP 11/26/2013 Gen: NAD, well appearing HEENT: NCAT Buttocks: approximately 6-7cm area of erythema, tenderness, induration present on R medial buttock. Spontaneously draining purulent bloody material. No skin necrosis. GU: R inguinal area with tender lymph nodes present  PROCEDURE NOTE: The procedure, risks and complications were discussed with the patient, and the patient signed consent to the procedure. The R buttock abscess was anesthetized with 1% lidocaine with epinephrine. The skin was sterilely prepped and in the usual fashion with betadine. After adequate local anesthesia, I&D with a scalpel, appx 1cm in length was performed on the R buttock. Purulent drainage expressed. The cavity was probed with a curved hemostat and loculations broken up, with resultant further expression of purulent material. The cavity probed approximately 3cm deep. The incision was packed with packing strip. The area was then covered gently with gauze. Patient was given post procedure instructions. Procedure was supervised in its entirety by Dr.  Perley JainMcDiarmid.  ASSESSMENT/PLAN:  1. R buttock abscess with reactive R inguinal lymphadenopathy -I&D performed today in clinic with successful drainage of purulent material -will rx clindamycin 300mg  QID x 7 days for antibiotic coverage -warm water baths at home -counseled to keep packing in place, gently cover with gauze at home -pt to return on Monday 7/27 for recheck of abscess -counseled on reasons to return sooner including worsening pain, fever, nausea/vomiting, etc.  2. Vaginal discharge -did not evaluate this today, but I provided pt withr x for  2 doses of diflucan as she will be at risk for developing a yeast infection with the antibiotic therapy.  FOLLOW UP: F/u in 4 days for recheck abscess.  GrenadaBrittany J. Pollie MeyerMcIntyre, MD [facility]

## 2014-01-04 ENCOUNTER — Telehealth: Payer: Self-pay | Admitting: Family Medicine

## 2014-01-04 NOTE — Telephone Encounter (Signed)
[facility] After [facility]  Patient calling and asking about instructions regarding I&D from yesterday. Patient was unsure if she should be removing the packing that was put into the abscess. Reviewed clinic note and Dr. Pollie MeyerMcIntyre instructed her to keep packing in place and covered with gauze until her visit on Monday for re-eval. Pt has not been able to pick up the antibiotics yet, told by pharmacy they would not be ready until tomorrow morning 7/25. Patient verbalized understanding and was appreciative for call back.  Tawni CarnesAndrew Katie Moch, MD 01/04/2014, 5:31 PM PGY-2, [facility]

## 2014-01-07 ENCOUNTER — Ambulatory Visit: Payer: Medicaid Other | Admitting: Family Medicine

## 2014-01-22 ENCOUNTER — Ambulatory Visit (INDEPENDENT_AMBULATORY_CARE_PROVIDER_SITE_OTHER): Payer: Medicaid Other | Admitting: Family Medicine

## 2014-01-22 ENCOUNTER — Encounter: Payer: Self-pay | Admitting: Family Medicine

## 2014-01-22 VITALS — BP 98/70 | HR 60 | Temp 98.3°F | Ht 67.0 in | Wt 103.0 lb

## 2014-01-22 DIAGNOSIS — I959 Hypotension, unspecified: Secondary | ICD-10-CM

## 2014-01-22 DIAGNOSIS — L259 Unspecified contact dermatitis, unspecified cause: Secondary | ICD-10-CM | POA: Diagnosis present

## 2014-01-22 DIAGNOSIS — R599 Enlarged lymph nodes, unspecified: Secondary | ICD-10-CM

## 2014-01-22 DIAGNOSIS — R590 Localized enlarged lymph nodes: Secondary | ICD-10-CM

## 2014-01-22 MED ORDER — HYDROCORTISONE VALERATE 0.2 % EX OINT: 1.0000 "application " | TOPICAL_OINTMENT | Freq: Two times a day (BID) | CUTANEOUS | Status: DC

## 2014-01-22 MED ORDER — PREDNISONE 20 MG PO TABS: 20.0000 mg | ORAL_TABLET | Freq: Every day | ORAL | Status: DC

## 2014-01-22 NOTE — Assessment & Plan Note (Signed)
Asymptomatic. Likely due to dehydration. I reviewed her previous BP record which were normal. Hydration recommended today.

## 2014-01-22 NOTE — Assessment & Plan Note (Signed)
Likely related to inflammation from scalp irritation. Prednisone prescribed for irritation. Patient reassured this should improve in few days to week. F/U in 1 wk with PCP for reassessment recommended or sooner if worsening. She agreed with plan and verbalized understanding.

## 2014-01-22 NOTE — Progress Notes (Signed)
Subjective:     Patient ID: Courtney EmerySophia D Adams, female   DOB: 04/29/1986, 28 y.o.   MRN: [id number]  HPI Scalp irritation/Knot on scalp: C/O itching,rash and pain on her scalp which started 2 days ago after she applied hair dye to her hair.She is uncertain if she had used similar dye in the past. No fever.Since her itching started she noticed 2 knots on the back of her neck which also has some pressure like pain about 2/10 in severity. 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 Denies weakness or dizziness. Feels well otherwise.  Current Outpatient Prescriptions on File Prior to Visit  Medication Sig Dispense Refill  . clindamycin (CLEOCIN) 300 MG capsule Take 1 capsule (300 mg total) by mouth 4 (four) times daily.  28 capsule  0  . cyclobenzaprine (FLEXERIL) 10 MG tablet Take 1 tablet (10 mg total) by mouth 2 (two) times daily as needed for muscle spasms.  20 tablet  0  . fluconazole (DIFLUCAN) 150 MG tablet Take 1 tablet (150 mg total) by mouth once. Repeat in 3 days if not better.  2 tablet  0  . ibuprofen (ADVIL,MOTRIN) 800 MG tablet Take 1 tablet (800 mg total) by mouth 3 (three) times daily.  21 tablet  0  . Prenatal Vit-Fe Fumarate-FA (PRENATAL MULTIVITAMIN) TABS tablet Take 1 tablet by mouth daily at 12 noon.       No current facility-administered medications on file prior to visit.   Past Medical History  Diagnosis Date  . BV (bacterial vaginosis)   . Anxiety   . Preterm delivery   . Chlamydia   . Gonorrhea   . Trichomonas   . Bipolar 1 disorder   . Anxiety   . Anemia   . Abortion       Review of Systems  Respiratory: Negative.   Cardiovascular: Negative.   Gastrointestinal: Negative.   Skin: Positive for rash.  All other systems reviewed and are negative.  Filed Vitals:   01/22/14 1102  BP: 98/70  Pulse: 60  Temp: 98.3 F (36.8 C)  TempSrc: Oral  Height: 5\' 7"  (1.702 m)  Weight: 103 lb (46.72  kg)       Objective:   Physical Exam  Nursing note and vitals reviewed. Constitutional: She appears well-developed. No distress.  Cardiovascular: Normal rate, regular rhythm, normal heart sounds and intact distal pulses.   No murmur heard. Pulmonary/Chest: Effort normal and breath sounds normal. No respiratory distress. She has no wheezes. She exhibits no tenderness.  Lymphadenopathy:       Head (right side): Occipital adenopathy present.       Head (left side): Occipital adenopathy present.  Two b/l solitary occipital LNpathy, very mildly tender to touch, mobile and rubbery.   Skin:          Assessment:     Contact dermatitis Occipital Lymphadenopathy  Hypotension    Plan:     Check problem list.

## 2014-01-22 NOTE — Assessment & Plan Note (Signed)
Counseled to avoid irritant. Topical hydrocortisone cream prescribed. Oral prednisone 20 mg qd for 7 days. No sign of infection hence A/B not indicated at this time. F/U in 1 wk for reassessment.

## 2014-01-22 NOTE — Patient Instructions (Signed)
It was nice seeing you today, I am sorry you have irritation on your scalp, this is likely due to the chemical causing skin inflammation, this might have caused your lymph node to swell up as well. I will start you on oral and topical steroid for relieve. Also use Tylenol as needed for pain. Please see your PCP in 1-2 wks for reassessment. If worsening give us a call.  Contact Dermatitis Contact dermatitis is a rash that happens when something touches the skin. You touched something that irritates your skin, or you have allergies to something you touched. HOME CARE   Avoid the thing that caused your rash.  Keep your rash away from hot water, soap, sunlight, chemicals, and other things that might bother it.  Do not scratch your rash.  You can take cool baths to help stop itching.  Only take medicine as told by your doctor.  Keep all doctor visits as told. GET HELP RIGHT AWAY IF:   Your rash is not better after 3 days.  Your rash gets worse.  Your rash is puffy (swollen), tender, red, sore, or warm.  You have problems with your medicine. MAKE SURE YOU:   Understand these instructions.  Will watch your condition.  Will get help right away if you are not doing well or get worse. Document Released: 03/28/2009 Document Revised: 08/23/2011 Document Reviewed: 11/03/2010 [facility] Patient Information 2015 [place]Exit[place], [place]. This information is not intended to replace advice given to you by your health care provider. Make sure you discuss any questions you have with your health care provider.

## 2014-02-06 ENCOUNTER — Other Ambulatory Visit: Payer: Self-pay | Admitting: Family Medicine

## 2014-02-07 NOTE — Telephone Encounter (Signed)
Reviewed chart. Sent in refill for Metrogel with 5 refills, for chronic recurrent BV as previously diagnosed after multiple visits.  Saralyn Pilar, DO [facility], PGY-2

## 2014-02-07 NOTE — Telephone Encounter (Signed)
Pt called because she said she is suppose to have a year worth of refills on her Metronidazole. Can we call this in? jw

## 2014-02-20 ENCOUNTER — Telehealth: Payer: Self-pay | Admitting: Family Medicine

## 2014-02-20 DIAGNOSIS — B9689 Other specified bacterial agents as the cause of diseases classified elsewhere: Secondary | ICD-10-CM

## 2014-02-20 DIAGNOSIS — N760 Acute vaginitis: Secondary | ICD-10-CM

## 2014-02-20 MED ORDER — METRONIDAZOLE-CLEANSER 0.75 % CREAM EX KIT: PACK | CUTANEOUS | Status: DC

## 2014-02-20 NOTE — Telephone Encounter (Signed)
Called patient, discussed concerns. She stated that previously treated in July 2015 for recurrent BV did not tolerate oral metronidazole (due to nausea / GI upset), had MetroGel rx sent in, but now states this is not covered. Reports recurrent symptoms, requesting Metronidazole cream rx. Sent in new prescription to pharmacy for 7 day course with 1 refill due to recurrent symptoms. Strongly advised patient to schedule follow-up apt in clinic for re-evaluation and anticipate needing Pelvic Exam / wet prep if symptoms persistent or worsen. Advised her that typically we do not rx over the phone, but since she had a previous prescription that was unable to be filled, this time I would send in an alternative. Patient understood will follow-up if needed.  Saralyn Pilar, DO [facility], PGY-2

## 2014-02-20 NOTE — Telephone Encounter (Signed)
Courtney Adams need a new rx for her conditon because medicaid no longer covers the Metronidazole vaginal gel.  Need to have a cream or pill form sent to pharmacy.  Also want it sent to [facility] on [address]

## 2014-02-22 ENCOUNTER — Telehealth: Payer: Self-pay | Admitting: *Deleted

## 2014-02-22 DIAGNOSIS — N760 Acute vaginitis: Secondary | ICD-10-CM

## 2014-02-22 DIAGNOSIS — B9689 Other specified bacterial agents as the cause of diseases classified elsewhere: Secondary | ICD-10-CM

## 2014-02-22 MED ORDER — METRONIDAZOLE 500 MG PO TABS: 500.0000 mg | ORAL_TABLET | Freq: Two times a day (BID) | ORAL | Status: DC

## 2014-02-22 NOTE — Telephone Encounter (Signed)
Received fax from [facility] stating Rosadan 0.75% cream kit is not available from the warehouse.  Please change to another form of metronidazole such as metrogel. Please call [facility] at [phone] or send new Rx.  Derl Barrow, RN

## 2014-02-22 NOTE — Telephone Encounter (Signed)
Sent rx for Metronidazole  PO BID x 7 days, patient agreed to try medication again to see if tolerates. Previously requested change from PO to MetroGel, which was not covered, then requested to MetroCream, which is not available. No other options at this point. As instructed before, advised to follow-up if no resolution.  Saralyn Pilar, DO [facility], PGY-2

## 2014-04-03 ENCOUNTER — Encounter (HOSPITAL_COMMUNITY): Payer: Self-pay | Admitting: Emergency Medicine

## 2014-04-03 ENCOUNTER — Emergency Department (HOSPITAL_COMMUNITY): Payer: Medicaid Other

## 2014-04-03 ENCOUNTER — Emergency Department (HOSPITAL_COMMUNITY)
Admission: EM | Admit: 2014-04-03 | Discharge: 2014-04-04 | Disposition: A | Discharge status: Home / Self Care | Payer: Medicaid Other | Attending: Emergency Medicine | Admitting: Emergency Medicine

## 2014-04-03 DIAGNOSIS — X58XXXA Exposure to other specified factors, initial encounter: Secondary | ICD-10-CM | POA: Insufficient documentation

## 2014-04-03 DIAGNOSIS — S30814A Abrasion of vagina and vulva, initial encounter: Secondary | ICD-10-CM | POA: Insufficient documentation

## 2014-04-03 DIAGNOSIS — Z8751 Personal history of pre-term labor: Secondary | ICD-10-CM | POA: Insufficient documentation

## 2014-04-03 DIAGNOSIS — Z79899 Other long term (current) drug therapy: Secondary | ICD-10-CM | POA: Insufficient documentation

## 2014-04-03 DIAGNOSIS — F419 Anxiety disorder, unspecified: Secondary | ICD-10-CM | POA: Insufficient documentation

## 2014-04-03 DIAGNOSIS — Y939 Activity, unspecified: Secondary | ICD-10-CM | POA: Diagnosis not present

## 2014-04-03 DIAGNOSIS — R079 Chest pain, unspecified: Secondary | ICD-10-CM

## 2014-04-03 DIAGNOSIS — Z720 Tobacco use: Secondary | ICD-10-CM | POA: Insufficient documentation

## 2014-04-03 DIAGNOSIS — Z8619 Personal history of other infectious and parasitic diseases: Secondary | ICD-10-CM | POA: Insufficient documentation

## 2014-04-03 DIAGNOSIS — Y929 Unspecified place or not applicable: Secondary | ICD-10-CM | POA: Insufficient documentation

## 2014-04-03 DIAGNOSIS — Z862 Personal history of diseases of the blood and blood-forming organs and certain disorders involving the immune mechanism: Secondary | ICD-10-CM | POA: Diagnosis not present

## 2014-04-03 DIAGNOSIS — Z8742 Personal history of other diseases of the female genital tract: Secondary | ICD-10-CM | POA: Diagnosis not present

## 2014-04-03 DIAGNOSIS — F410 Panic disorder [episodic paroxysmal anxiety] without agoraphobia: Secondary | ICD-10-CM

## 2014-04-03 LAB — CBC
HCT: 37 % (ref 36.0–46.0)
Hemoglobin: 12.4 g/dL (ref 12.0–15.0)
MCH: 31.7 pg (ref 26.0–34.0)
MCHC: 33.5 g/dL (ref 30.0–36.0)
MCV: 94.6 fL (ref 78.0–100.0)
Platelets: 198 10*3/uL (ref 150–400)
RBC: 3.91 MIL/uL (ref 3.87–5.11)
RDW: 12.2 % (ref 11.5–15.5)
WBC: 3.4 10*3/uL — ABNORMAL LOW (ref 4.0–10.5)

## 2014-04-03 LAB — BASIC METABOLIC PANEL
Anion gap: 12 (ref 5–15)
BUN: 14 mg/dL (ref 6–23)
CO2: 24 mEq/L (ref 19–32)
Calcium: 9.5 mg/dL (ref 8.4–10.5)
Chloride: 103 mEq/L (ref 96–112)
Creatinine, Ser: 0.91 mg/dL (ref 0.50–1.10)
GFR calc Af Amer: >90 mL/min (ref >90)
GFR calc non Af Amer: 84 mL/min — ABNORMAL LOW (ref >90)
Glucose, Bld: 93 mg/dL (ref 70–99)
Potassium: 6.1 mEq/L — ABNORMAL HIGH (ref 3.7–5.3)
Sodium: 139 mEq/L (ref 137–147)

## 2014-04-03 LAB — PRO B NATRIURETIC PEPTIDE: Pro B Natriuretic peptide (BNP): 39.1 pg/mL (ref 0–125)

## 2014-04-03 LAB — I-STAT TROPONIN, ED: Troponin i, poc: 0 ng/mL (ref 0.00–0.08)

## 2014-04-03 NOTE — ED Notes (Signed)
Pt states that she has been experiencing CPx1week, states that it may be her anxiety. Also c/o vaginal itching starting yesterday with no discharge.

## 2014-04-03 NOTE — ED Notes (Signed)
Pt. reports intermittent left chest pain for 1 week with mild SOB and nausea , hands and feet tingling / vaginal itching.

## 2014-04-04 LAB — WET PREP, GENITAL
Trich, Wet Prep: NONE SEEN
Yeast Wet Prep HPF POC: NONE SEEN

## 2014-04-04 LAB — POTASSIUM: Potassium: 3.9 mEq/L (ref 3.7–5.3)

## 2014-04-04 NOTE — Discharge Instructions (Signed)
Avoid using douches.  No soaps or detergents in your vaginal area.  Cool water to cleanse your privates only until the itching improves.  Follow up with your doctor for further workup of your anxiety.  You may benefit from medications   Panic Attacks Panic attacks are sudden, short-livedsurges of severe anxiety, fear, or discomfort. They may occur for no reason when you are relaxed, when you are anxious, or when you are sleeping. Panic attacks may occur for a number of reasons:   Healthy people occasionally have panic attacks in extreme, life-threatening situations, such as war or natural disasters. Normal anxiety is a protective mechanism of the body that helps us react to danger (fight or flight response).  Panic attacks are often seen with anxiety disorders, such as panic disorder, social anxiety disorder, generalized anxiety disorder, and phobias. Anxiety disorders cause excessive or uncontrollable anxiety. They may interfere with your relationships or other life activities.  Panic attacks are sometimes seen with other mental illnesses, such as depression and posttraumatic stress disorder.  Certain medical conditions, prescription medicines, and drugs of abuse can cause panic attacks. SYMPTOMS  Panic attacks start suddenly, peak within 20 minutes, and are accompanied by four or more of the following symptoms:  Pounding heart or fast heart rate (palpitations).  Sweating.  Trembling or shaking.  Shortness of breath or feeling smothered.  Feeling choked.  Chest pain or discomfort.  Nausea or strange feeling in your stomach.  Dizziness, light-headedness, or feeling like you will faint.  Chills or hot flushes.  Numbness or tingling in your lips or hands and feet.  Feeling that things are not real or feeling that you are not yourself.  Fear of losing control or going crazy.  Fear of dying. Some of these symptoms can mimic serious medical conditions. For example, you may think  you are having a heart attack. Although panic attacks can be very scary, they are not life threatening. DIAGNOSIS  Panic attacks are diagnosed through an assessment by your health care provider. Your health care provider will ask questions about your symptoms, such as where and when they occurred. Your health care provider will also ask about your medical history and use of alcohol and drugs, including prescription medicines. Your health care provider may order blood tests or other studies to rule out a serious medical condition. Your health care provider may refer you to a mental health professional for further evaluation. TREATMENT   Most healthy people who have one or two panic attacks in an extreme, life-threatening situation will not require treatment.  The treatment for panic attacks associated with anxiety disorders or other mental illness typically involves counseling with a mental health professional, medicine, or a combination of both. Your health care provider will help determine what treatment is best for you.  Panic attacks due to physical illness usually go away with treatment of the illness. If prescription medicine is causing panic attacks, talk with your health care provider about stopping the medicine, decreasing the dose, or substituting another medicine.  Panic attacks due to alcohol or drug abuse go away with abstinence. Some adults need professional help in order to stop drinking or using drugs. HOME CARE INSTRUCTIONS   Take all medicines as directed by your health care provider.   Schedule and attend follow-up visits as directed by your health care provider. It is important to keep all your appointments. SEEK MEDICAL CARE IF:  You are not able to take your medicines as prescribed.  Your symptoms  do not improve or get worse. SEEK IMMEDIATE MEDICAL CARE IF:   You experience panic attack symptoms that are different than your usual symptoms.  You have serious thoughts  about hurting yourself or others.  You are taking medicine for panic attacks and have a serious side effect. MAKE SURE YOU:  Understand these instructions.  Will watch your condition.  Will get help right away if you are not doing well or get worse. Document Released: 05/31/2005 Document Revised: 06/05/2013 Document Reviewed: 01/12/2013 [facility] Patient Information 2015 [place]ExitCare, [place]LL[place]. This information is not intended to replace advice given to you by your health care provider. Make sure you discuss any questions you have with your health care provider.

## 2014-04-04 NOTE — ED Provider Notes (Signed)
CSN: [id number]     Arrival date & time 04/03/14  2113 History   First MD Initiated Contact with Patient 04/03/14 2331     Chief Complaint  Patient presents with  . Chest Pain     (Consider location/radiation/quality/duration/timing/severity/associated sxs/prior Treatment) HPI -year-old female presents to emergency room with complaint of intermittent chest pain for the last week.  When she has the chest pain, she reports that her heart is racing, and she gets numbness and tingling in her hands and feet.  Patient reports she has history of anxiety.  She's not currently taking any medication for anxiety.  She reports that she is trying to quit smoking.  The symptoms last for about 5 minutes and then resolve.  She presents today as family is concerned and wished her to get checked out.  She denies any comorbidities for coronary heart disease.  She denies any shortness of breath leg swelling pleuritic type chest pain.  Patient also complains of vaginal itching that started yesterday.  She has a new sexual partner, and does not use protection.  She denies any discharge.  She has a small area of her labia that is irritated. Past Medical History  Diagnosis Date  . BV (bacterial vaginosis)   . Anxiety   . Preterm delivery   . Chlamydia   . Gonorrhea   . Trichomonas   . Bipolar 1 disorder   . Anxiety   . Anemia   . Abortion    Past Surgical History  Procedure Laterality Date  . Dilation and curettage of uterus     Family History  Problem Relation Age of Onset  . Anemia Mother   . Cervical cancer Sister    History  Substance Use Topics  . Smoking status: Current Some Day Smoker -- 0.20 packs/day    Types: Cigarettes  . Smokeless tobacco: Never Used     Comment: 2 -3 cigs a day  . Alcohol Use: No   OB History   Grav Para Term Preterm Abortions TAB SAB Ect Mult Living   4 1  1 3 3    1      Review of Systems   See History of Present Illness; otherwise all other systems are  reviewed and negative  Allergies  Review of patient's allergies indicates no known allergies.  Home Medications   Prior to Admission medications   Medication Sig Start Date End Date Taking? Authorizing Provider  levonorgestrel (PLAN B,NEXT CHOICE) 0.75 MG tablet Take 0.75 mg by mouth once.   Yes Historical Provider, MD  Prenatal Vit-Fe Fumarate-FA (PRENATAL MULTIVITAMIN) TABS tablet Take 1 tablet by mouth daily at 12 noon. 09/21/13  Yes Andrena MewsMichael D Rigby, DO   BP 112/69  Pulse 81  Temp(Src) 98.8 F (37.1 C) (Oral)  Resp 18  Ht 5\' 7"  (1.702 m)  Wt 102 lb (46.267 kg)  BMI 15.97 kg/m2  SpO2 100%  LMP 03/19/2014 Physical Exam  Nursing note and vitals reviewed. Constitutional: She is oriented to person, place, and time. She appears well-developed and well-nourished.  HENT:  Head: Normocephalic and atraumatic.  Nose: Nose normal.  Mouth/Throat: Oropharynx is clear and moist.  Eyes: Conjunctivae and EOM are normal. Pupils are equal, round, and reactive to light.  Neck: Normal range of motion. Neck supple. No JVD present. No tracheal deviation present. No thyromegaly present.  Cardiovascular: Normal rate, regular rhythm, normal heart sounds and intact distal pulses.  Exam reveals no gallop and no friction rub.   No murmur  heard. Pulmonary/Chest: Effort normal and breath sounds normal. No stridor. No respiratory distress. She has no wheezes. She has no rales. She exhibits no tenderness.  Abdominal: Soft. Bowel sounds are normal. She exhibits no distension and no mass. There is no tenderness. There is no rebound and no guarding.  Genitourinary:  External genitalia abnormal - small abrasion to right posterior labia Vagina without discharge Cervix  normal negative for cervical motion tenderness Adnexa palpated, no masses or negative for tenderness noted Bladder palpated negative for tenderness Uterus palpated no masses or negative for tenderness    Musculoskeletal: Normal range of  motion. She exhibits no edema and no tenderness.  Lymphadenopathy:    She has no cervical adenopathy.  Neurological: She is alert and oriented to person, place, and time. She displays normal reflexes. She exhibits normal muscle tone. Coordination normal.  Skin: Skin is warm and dry. No rash noted. No erythema. No pallor.  Psychiatric: Her behavior is normal. Judgment and thought content normal.  Anxious appearing    ED Course  Procedures (including critical care time) Labs Review Labs Reviewed  CBC - Abnormal; Notable for the following:    WBC 3.4 (*)    All other components within normal limits  BASIC METABOLIC PANEL - Abnormal; Notable for the following:    Potassium 6.1 (*)    GFR calc non Af Amer 84 (*)    All other components within normal limits  WET PREP, GENITAL  GC/CHLAMYDIA PROBE AMP  PRO B NATRIURETIC PEPTIDE  POTASSIUM  I-STAT TROPOININ, ED    Imaging Review Dg Chest 2 View  04/03/2014   CLINICAL DATA:  Left-sided chest pain with shortness of breath for 1 week.  EXAM: CHEST  2 VIEW  COMPARISON:  05/28/2013  FINDINGS: The heart size and mediastinal contours are within normal limits. Both lungs are clear. The visualized skeletal structures are unremarkable.  IMPRESSION: No active cardiopulmonary disease.   Electronically Signed   By: Burman NievesWilliam  Stevens M.D.   On: 04/03/2014 22:06     EKG Interpretation   Date/Time:  Wednesday April 03 2014 21:19:01 EDT Ventricular Rate:  71 PR Interval:  126 QRS Duration: 82 QT Interval:  352 QTC Calculation: 382 R Axis:   86 Text Interpretation:  Normal sinus rhythm Normal ECG Confirmed by Evolett Somarriba   MD, Zhanae Proffit ([id number]) on 04/03/2014 11:38:44 PM      MDM   Final diagnoses:  Labial abrasion, initial encounter  Anxiety attack    28 year old female with what sounds to be intermittent anxiety attacks.  No ischemia on her EKG, troponin is negative.  Pelvic exam without signs of overt infection.  She does have a small area of  irritation, but no signs of herpetic lesions.  Patient instructed to avoid douching and soaps in her vaginal area just cool water.  Will have her followup with her primary care Dr. for recheck for her anxiety.    Olivia Mackielga M Kingsten Enfield, MD 04/04/14 [phone]

## 2014-04-05 LAB — GC/CHLAMYDIA PROBE AMP
CT Probe RNA: NEGATIVE
GC Probe RNA: NEGATIVE

## 2014-04-15 ENCOUNTER — Encounter (HOSPITAL_COMMUNITY): Payer: Self-pay | Admitting: Emergency Medicine

## 2014-04-19 ENCOUNTER — Ambulatory Visit: Payer: Medicaid Other | Admitting: Family Medicine

## 2014-06-08 ENCOUNTER — Other Ambulatory Visit: Payer: Self-pay | Admitting: Family Medicine

## 2014-06-10 ENCOUNTER — Emergency Department (HOSPITAL_COMMUNITY)
Admission: EM | Admit: 2014-06-10 | Discharge: 2014-06-10 | Disposition: A | Discharge status: Home / Self Care | Payer: Medicaid Other | Source: Home / Self Care | Attending: Family Medicine | Admitting: Family Medicine

## 2014-06-10 ENCOUNTER — Other Ambulatory Visit (HOSPITAL_COMMUNITY)
Admission: RE | Admit: 2014-06-10 | Discharge: 2014-06-10 | Disposition: A | Discharge status: Home / Self Care | Payer: Medicaid Other | Source: Ambulatory Visit | Attending: Family Medicine | Admitting: Family Medicine

## 2014-06-10 ENCOUNTER — Encounter (HOSPITAL_COMMUNITY): Payer: Self-pay | Admitting: Emergency Medicine

## 2014-06-10 DIAGNOSIS — N760 Acute vaginitis: Secondary | ICD-10-CM

## 2014-06-10 DIAGNOSIS — Z113 Encounter for screening for infections with a predominantly sexual mode of transmission: Secondary | ICD-10-CM | POA: Diagnosis not present

## 2014-06-10 LAB — POCT URINALYSIS DIP (DEVICE)
Bilirubin Urine: NEGATIVE
Glucose, UA: NEGATIVE mg/dL
Ketones, ur: NEGATIVE mg/dL
Leukocytes, UA: NEGATIVE
Nitrite: NEGATIVE
Protein, ur: NEGATIVE mg/dL
Specific Gravity, Urine: >=1.03 (ref 1.005–1.030)
Urobilinogen, UA: 0.2 mg/dL (ref 0.0–1.0)
pH: 5.5 (ref 5.0–8.0)

## 2014-06-10 LAB — POCT PREGNANCY, URINE: Preg Test, Ur: NEGATIVE

## 2014-06-10 MED ORDER — METRONIDAZOLE 0.75 % VA GEL: 1.0000 | Freq: Every day | VAGINAL | Status: DC

## 2014-06-10 MED ORDER — METRONIDAZOLE 500 MG PO TABS: 500.0000 mg | ORAL_TABLET | Freq: Two times a day (BID) | ORAL | Status: DC

## 2014-06-10 MED ORDER — FLUCONAZOLE 150 MG PO TABS
150.0000 mg | ORAL_TABLET | Freq: Once | ORAL | Status: DC
Start: 2014-06-10 — End: 2014-06-11

## 2014-06-10 NOTE — ED Provider Notes (Signed)
Courtney Adams is a 28 y.o. female who presents to Urgent Care today for vaginal discharge. Patient has vaginal itching and discharge present for the last 2 days. Symptoms are consistent with yeast and BV. No abdominal pain fevers or chills.   Past Medical History  Diagnosis Date  . BV (bacterial vaginosis)   . Anxiety   . Preterm delivery   . Chlamydia   . Gonorrhea   . Trichomonas   . Bipolar 1 disorder   . Anxiety   . Anemia   . Abortion    Past Surgical History  Procedure Laterality Date  . Dilation and curettage of uterus     History  Substance Use Topics  . Smoking status: Current Some Day Smoker -- 0.20 packs/day    Types: Cigarettes  . Smokeless tobacco: Never Used     Comment: 2 -3 cigs a day  . Alcohol Use: No   ROS as above Medications: No current facility-administered medications for this encounter.   Current Outpatient Prescriptions  Medication Sig Dispense Refill  . fluconazole (DIFLUCAN) 150 MG tablet Take 1 tablet (150 mg total) by mouth once. 1 tablet 1  . levonorgestrel (PLAN B,NEXT CHOICE) 0.75 MG tablet Take 0.75 mg by mouth once.    . metroNIDAZOLE (FLAGYL) 500 MG tablet Take 1 tablet (500 mg total) by mouth 2 (two) times daily. 14 tablet 0  . metroNIDAZOLE (METROGEL) 0.75 % vaginal gel Place 1 Applicatorful vaginally at bedtime. 5 nights 70 g 1  . Prenatal Vit-Fe Fumarate-FA (PRENATAL MULTIVITAMIN) TABS tablet Take 1 tablet by mouth daily at 12 noon.     No Known Allergies   Exam:  BP 109/74 mmHg  Pulse 84  Temp(Src) 99.3 F (37.4 C) (Oral)  Resp 14  SpO2 100%  LMP 05/19/2014 Gen: Well NAD  GYN: Normal external genitalia. Vaginal canal with yellowish discharge. Normal-appearing cervix.  Results for orders placed or performed during the hospital encounter of 06/10/14 (from the past 24 hour(s))  POCT urinalysis dip (device)     Status: Abnormal   Collection Time: 06/10/14  3:28 PM  Result Value Ref Range   Glucose, UA NEGATIVE NEGATIVE  mg/dL   Bilirubin Urine NEGATIVE NEGATIVE   Ketones, ur NEGATIVE NEGATIVE mg/dL   Specific Gravity, Urine >=1.030 1.005 - 1.030   Hgb urine dipstick TRACE (A) NEGATIVE   pH 5.5 5.0 - 8.0   Protein, ur NEGATIVE NEGATIVE mg/dL   Urobilinogen, UA 0.2 0.0 - 1.0 mg/dL   Nitrite NEGATIVE NEGATIVE   Leukocytes, UA NEGATIVE NEGATIVE  Pregnancy, urine POC     Status: None   Collection Time: 06/10/14  3:33 PM  Result Value Ref Range   Preg Test, Ur NEGATIVE NEGATIVE   No results found.  Assessment and Plan: 28 y.o. female with vaginitis likely BV. Treatment with Flagyl and fluconazole. Vaginal cytology for Trichomonas, chlamydia and gonorrhea pending.  Discussed warning signs or symptoms. Please see discharge instructions. Patient expresses understanding.     Rodolph BongEvan S Corey, MD 06/10/14 [phone]

## 2014-06-10 NOTE — ED Notes (Signed)
Pt has been having vaginal itching and d/c for the last two days.  Her last sexual encounter was two days ago with an exboyfriend.  STD status of the partner is unknown.

## 2014-06-10 NOTE — Discharge Instructions (Signed)
Thank you for coming in today. Do not drink while taking metronidazole.   Bacterial Vaginosis Bacterial vaginosis is a vaginal infection that occurs when the normal balance of bacteria in the vagina is disrupted. It results from an overgrowth of certain bacteria. This is the most common vaginal infection in women of childbearing age. Treatment is important to prevent complications, especially in pregnant women, as it can cause a premature delivery. CAUSES  Bacterial vaginosis is caused by an increase in harmful bacteria that are normally present in smaller amounts in the vagina. Several different kinds of bacteria can cause bacterial vaginosis. However, the reason that the condition develops is not fully understood. RISK FACTORS Certain activities or behaviors can put you at an increased risk of developing bacterial vaginosis, including:  Having a new sex partner or multiple sex partners.  Douching.  Using an intrauterine device (IUD) for contraception. Women do not get bacterial vaginosis from toilet seats, bedding, swimming pools, or contact with objects around them. SIGNS AND SYMPTOMS  Some women with bacterial vaginosis have no signs or symptoms. Common symptoms include:  Grey vaginal discharge.  A fishlike odor with discharge, especially after sexual intercourse.  Itching or burning of the vagina and vulva.  Burning or pain with urination. DIAGNOSIS  Your health care provider will take a medical history and examine the vagina for signs of bacterial vaginosis. A sample of vaginal fluid may be taken. Your health care provider will look at this sample under a microscope to check for bacteria and abnormal cells. A vaginal pH test may also be done.  TREATMENT  Bacterial vaginosis may be treated with antibiotic medicines. These may be given in the form of a pill or a vaginal cream. A second round of antibiotics may be prescribed if the condition comes back after treatment.  HOME CARE  INSTRUCTIONS   Only take over-the-counter or prescription medicines as directed by your health care provider.  If antibiotic medicine was prescribed, take it as directed. Make sure you finish it even if you start to feel better.  Do not have sex until treatment is completed.  Tell all sexual partners that you have a vaginal infection. They should see their health care provider and be treated if they have problems, such as a mild rash or itching.  Practice safe sex by using condoms and only having one sex partner. SEEK MEDICAL CARE IF:   Your symptoms are not improving after 3 days of treatment.  You have increased discharge or pain.  You have a fever. MAKE SURE YOU:   Understand these instructions.  Will watch your condition.  Will get help right away if you are not doing well or get worse. FOR MORE INFORMATION  Centers for Disease Control and Prevention, Division of STD Prevention: SolutionApps.co.zawww.cdc.gov/std American Sexual Health Association (ASHA): www.ashastd.org  Document Released: 05/31/2005 Document Revised: 03/21/2013 Document Reviewed: 01/10/2013 [facility] Patient Information 2015 [employer] [place]. This information is not intended to replace advice given to you by your health care provider. Make sure you discuss any questions you have with your health care provider.

## 2014-06-11 LAB — CERVICOVAGINAL ANCILLARY ONLY
Chlamydia: NEGATIVE
Neisseria Gonorrhea: NEGATIVE
Wet Prep (BD Affirm): NEGATIVE
Wet Prep (BD Affirm): NEGATIVE
Wet Prep (BD Affirm): POSITIVE — AB

## 2014-06-12 NOTE — ED Notes (Signed)
GC/Chlamydia neg., Affirm: Candida and Trich neg., Gardnerella pos.  Pt. adequately treated with Flagyl and Metrogel. Courtney Adams, Courtney Adams 06/12/2014

## 2014-07-11 ENCOUNTER — Ambulatory Visit (INDEPENDENT_AMBULATORY_CARE_PROVIDER_SITE_OTHER): Payer: Medicaid Other | Admitting: Family Medicine

## 2014-07-11 ENCOUNTER — Encounter: Payer: Self-pay | Admitting: Family Medicine

## 2014-07-11 VITALS — BP 111/71 | HR 74 | Temp 98.5°F | Ht 67.0 in | Wt 108.0 lb

## 2014-07-11 DIAGNOSIS — L853 Xerosis cutis: Secondary | ICD-10-CM | POA: Insufficient documentation

## 2014-07-11 DIAGNOSIS — B3731 Acute candidiasis of vulva and vagina: Secondary | ICD-10-CM

## 2014-07-11 DIAGNOSIS — Z7251 High risk heterosexual behavior: Secondary | ICD-10-CM

## 2014-07-11 DIAGNOSIS — B373 Candidiasis of vulva and vagina: Secondary | ICD-10-CM

## 2014-07-11 DIAGNOSIS — N760 Acute vaginitis: Secondary | ICD-10-CM | POA: Insufficient documentation

## 2014-07-11 MED ORDER — FLUCONAZOLE 150 MG PO TABS: 150.0000 mg | ORAL_TABLET | Freq: Once | ORAL | Status: DC

## 2014-07-11 NOTE — Assessment & Plan Note (Signed)
Normal appearing minimally dry skin. Not consistent with eczema. No rash or concerns on exam.  Plan: 1. Recommend switching to daily moisturizer - Aveeno / Eucerin, advised dry skin precautions to reduce symptoms 2. Can continue MVI (unlikely to be beneficial)

## 2014-07-11 NOTE — Progress Notes (Signed)
   Subjective:    Patient ID: Courtney Adams, female    DOB: 08/08/1985, 29 y.o.   MRN: [id number]  HPI  DRY SKIN: - Presents today for multiple questions regarding general skin health and concerns about dry skin. Reports generalized areas of dry skin without any redness, rash, or raised lesions. Seems worse during winter months. Tried coco butter without significant improvement. Additionally questions if anything can help some stretch marks on skin. Requests advice on vitamins for general skin health. - Denies any fevers/chills, rash, bleeding or pain  YEAST INFECTION / STD HISTORY: - Reports recently seen at Urgent Care for treatment of BV, and she has subsequently developed a yeast infection with thick white discharge and some vaginal irritation. Requesting additional dose of Diflucan to treat this, as often develops yeast infection after antibiotics. - Additionally, admits to history of GC/Chlamydia, Trichomonas in past. - Gets yearly HIV screening tests (negative, last 09/2013) - Questions about precautions regarding anal sex. Admits to having regular intercourse with single female partner, does not use condoms.  I have reviewed and updated the following as appropriate: allergies and current medications  Social Hx: - Active smoker  Review of Systems  See above HPI    Objective:   Physical Exam  BP 111/71 mmHg  Pulse 74  Temp(Src) 98.5 F (36.9 C) (Oral)  Ht 5\' 7"  (1.702 m)  Wt 108 lb (48.988 kg)  BMI 16.91 kg/m2  LMP 06/16/2014  Gen - well-appearing, NAD HEENT - oropharynx clear, MMM Skin - warm, minimal dry skin on arms legs and mid abd / hips, no significant patches of eczema  Declined Pelvic Exam today.     Assessment & Plan:   See specific A&P problem list for details.

## 2014-07-11 NOTE — Assessment & Plan Note (Signed)
Suspected persistent yeast infection following recently treated BV  Plan: 1. Rx Diflucan 150mg  PO x 2 (2nd on Day 4 if persistent) 2. Offered Pelvic Exam for more accurate diagnosis, patient declined due to recent pelvic at Urgent Care. Understood that she would have to return for pelvic and wet prep if symptoms persisted or worsened

## 2014-07-11 NOTE — Assessment & Plan Note (Signed)
Sexually active, unprotected intercourse (vaginal, anal) - Last HIV 09/2013 (negative) - Last GC/Chlamydia (negative, 05/2014)  Plan: 1. Patient opted out of STD testing today. Plans to follow-up for annual HIV testing

## 2014-07-11 NOTE — Patient Instructions (Addendum)
As discussed, it is fine to continue your vitamins daily to promote skin health, it may not have dramatic results though. Can continue taking evening primose if tolerating without side-effects Looks like some areas of dry skin, which are normal. Recommend daily moisturizer with Aveeno or Eucerin - can use on affected areas as needed up to twice daily. Especially after showering (limit showers/baths to 1x daily) Take Diflucan today and then repeat dose in 3 days if persistent symptoms. If you have worsening symptoms need to return for pelvic exam and testing. Recommend safe sex practices with condom use. Annual HIV and STD testing.  If you have any other questions or concerns, please feel free to call the clinic to contact me. You may also schedule an earlier appointment if necessary.  Saralyn PilarAlexander Livianna Petraglia, DO [facility]

## 2014-08-28 ENCOUNTER — Encounter: Payer: Self-pay | Admitting: Family Medicine

## 2014-08-28 ENCOUNTER — Other Ambulatory Visit (HOSPITAL_COMMUNITY)
Admission: RE | Admit: 2014-08-28 | Discharge: 2014-08-28 | Disposition: A | Discharge status: Home / Self Care | Payer: Medicaid Other | Source: Ambulatory Visit | Attending: Family Medicine | Admitting: Family Medicine

## 2014-08-28 ENCOUNTER — Ambulatory Visit (INDEPENDENT_AMBULATORY_CARE_PROVIDER_SITE_OTHER): Payer: Medicaid Other | Admitting: Family Medicine

## 2014-08-28 VITALS — BP 100/80 | HR 83 | Temp 98.7°F | Ht 67.0 in | Wt 106.2 lb

## 2014-08-28 DIAGNOSIS — N760 Acute vaginitis: Secondary | ICD-10-CM

## 2014-08-28 DIAGNOSIS — B9689 Other specified bacterial agents as the cause of diseases classified elsewhere: Secondary | ICD-10-CM

## 2014-08-28 DIAGNOSIS — Z720 Tobacco use: Secondary | ICD-10-CM

## 2014-08-28 DIAGNOSIS — B3731 Acute candidiasis of vulva and vagina: Secondary | ICD-10-CM

## 2014-08-28 DIAGNOSIS — Z7251 High risk heterosexual behavior: Secondary | ICD-10-CM

## 2014-08-28 DIAGNOSIS — A499 Bacterial infection, unspecified: Secondary | ICD-10-CM

## 2014-08-28 DIAGNOSIS — Z113 Encounter for screening for infections with a predominantly sexual mode of transmission: Secondary | ICD-10-CM | POA: Diagnosis present

## 2014-08-28 DIAGNOSIS — B373 Candidiasis of vulva and vagina: Secondary | ICD-10-CM

## 2014-08-28 DIAGNOSIS — Z021 Encounter for pre-employment examination: Secondary | ICD-10-CM

## 2014-08-28 LAB — POCT WET PREP (WET MOUNT)
Clue Cells Wet Prep Whiff POC: POSITIVE
WBC, Wet Prep HPF POC: >20

## 2014-08-28 LAB — RPR

## 2014-08-28 MED ORDER — FLUCONAZOLE 150 MG PO TABS: 150.0000 mg | ORAL_TABLET | Freq: Once | ORAL | Status: DC

## 2014-08-28 MED ORDER — METRONIDAZOLE 0.75 % VA GEL: 1.0000 | Freq: Two times a day (BID) | VAGINAL | Status: DC

## 2014-08-28 MED ORDER — TUBERCULIN PPD 5 UNIT/0.1ML ID SOLN: 5.0000 [IU] | Freq: Once | INTRADERMAL | Status: DC

## 2014-08-28 NOTE — Progress Notes (Signed)
I was the preceptor for this visit. 

## 2014-08-28 NOTE — Progress Notes (Signed)
   Subjective:    Patient ID: Courtney Adams, female    DOB: 04/20/1986, 29 y.o.   MRN: [id number]  Patient presents for annual physical exam.  HPI  STD SCREENING / HIGH RISK SEXUAL BEHAVIOR: - Requests complete STD testing today. Reviewed prior screening with last GC/Chlamydia (negative, 05/2014) and HIV (negative, 09/2013). - Reports that she recently slept with ex-boyfriend (unprotected, no condom use), only 1 partner over past 3 months. No known STD exposure - Not interested in contraception at this time. - Denies any symptoms today. No vaginal discharge, odor, bleeding, dysuria, abdominal or pelvic pain  TOBACCO ABUSE: - Chronic intermittent use over past 18 years. Infrequently smokes 1-8 cigs daily, usually 1-3 daily, socially will smoke up to 8, never >10 cigs daily. Previously quit x 1 year back in 2012, "cold [place]", has not tried any medicines or NRT. - Major stressor and trigger for smoking seems to be other friends in apartment complex that smoke often - Currently not ready to quit, but understands concerns about smoking will have on her health long-term  HM: - Declined influenza vaccine today - Not due for pap smear - last 09/2012 (negative, no HPV testing) - next Pap due 09/2015 (with HPV co-testing) - Request TB Skin Test today (employement)  Family Hx: - No family history of cancer  I have reviewed and updated the following as appropriate: allergies and current medications  Social Hx:  - Active smoker x 18 years  Review of Systems  See above HPI. General ROS negative (no CP, SOB, cough, abdominal pain, joint or muscle pain, rashes)    Objective:   Physical Exam  Pulse 83  Temp(Src) 98.7 F (37.1 C) (Oral)  Ht 5\' 7"  (1.702 m)  Wt 106 lb 3.2 oz (48.172 kg)  BMI 16.63 kg/m2  Gen - well-appearing, pleasant, NAD HEENT - NCAT, PERRL, patent nares w/o congestion, oropharynx clear, MMM Neck - supple, non-tender, no LAD, no thyromegaly Heart - RRR, no murmurs  heard Lungs - CTAB, no wheezing, crackles, or rhonchi. Normal work of breathing. Abd - soft, NTND, no masses, +active BS Ext - non-tender, no edema, peripheral pulses intact +2 b/l Skin - warm, dry, no rashes Neuro - awake, alert, grossly non-focal  Pelvic Exam - Normal external female genitalia. Vaginal canal without lesions. Normal appearing cervix, without lesions or bleeding. Increased thick curd-like white discharge on exam. Bimanual exam without masses or cervical motion tenderness.  Chaperoned by Gilberto BetterMichelle Simpson, CMA     Assessment & Plan:   See specific A&P problem list for details.

## 2014-08-28 NOTE — Assessment & Plan Note (Signed)
Asymptomatic. Thick discharge on pelvic exam. H/o prior recurrent BV. Confirmed BV on wet prep - mod clue cells, +whiff  Plan: 1. Metrogel 0.75% vaginal applicator BID x 5 days (pt request) 2. Rx Diflucan 150mg  PO #2, take 1 then repeat on day 4 (pt request, susceptible to yeast infection after metronidazole) 3. RTC PRN

## 2014-08-28 NOTE — Assessment & Plan Note (Signed)
Remains sexually active, 1 partner, unprotected intercourse - Last HIV 09/2013 (negative) - Last GC/Chlamydia (negative, 05/2014)  Plan: 1. Pelvic exam with inc discharge, confirmed BV 2. Check HIV, RPR, GC/Chlamydia today 3. Counseling on safe sex, condom use

## 2014-08-28 NOTE — Patient Instructions (Addendum)
Thank you for coming into clinic today. Today we did your complete physical exam. Good for 1 year. You are not due for Pap Smear (last 09/2012) - next Pap Smear is due 09/2015 Blood and swab tests completed today for STDs - we will notify you of your results once available. If test shows Bacterial Vaginosis or Yeast - we will send in appropriate treatment (Metrogel and Diflucan) Recommend safe sex practices with condom use. Annual HIV and STD testing.  TB Test today - need to return in 48 to 72 hours to get it read.  Remember to work on Buyer, retailmentally preparing yourself to quit smoking. Set up your support system. Find something to occupy your time and mind for once you quit. When ready follow-up with me or call and I can get you scheduled with - Dr. Raymondo BandKoval (Pharmacy Clinic - Smoking Cessation)  Please follow-up in 1 year for next physical.  If you have any other questions or concerns, please feel free to call the clinic to contact me. You may also schedule an earlier appointment if necessary.  Courtney PilarAlexander Tanise Russman, DO [facility]

## 2014-08-28 NOTE — Assessment & Plan Note (Signed)
Active chronic smoke >18 years, previously quit 2012 x 1 year. No prior medical therapy or NRT tried. - Concern with friends (who also smoke) at apartment as primary stressor triggering her to continue smoking  Plan: 1. Not ready to quit. Contemplative state, advised to prepare mentally and find something to fill void of smoking. Find support with friends / family. 2. Smoking cessation counseling 3. Follow-up PRN. Recommend f/u with Dr[facility] ([facility]) when ready.

## 2014-08-29 ENCOUNTER — Telehealth: Payer: Self-pay | Admitting: Family Medicine

## 2014-08-29 LAB — HIV ANTIBODY (ROUTINE TESTING W REFLEX): HIV 1&2 Ab, 4th Generation: NONREACTIVE

## 2014-08-29 LAB — CERVICOVAGINAL ANCILLARY ONLY
Chlamydia: NEGATIVE
Neisseria Gonorrhea: NEGATIVE

## 2014-08-29 NOTE — Telephone Encounter (Signed)
[facility]. See below for results from that visit:  Blood Tests: HIV - Negative RPR (Syphilis) - Negative  Swab Test: Chlamydia: Negative Gonorrhea: Negative  I attempted to call patient with these results today, left voicemail for patient stating that all results were negative, and if questions / wants more info to [facility].  Please share above results with patient if she calls back. Thank you.  Also - please remind her that [facility] Skin test read.  Saralyn PilarAlexander Merl Bommarito, DO [facility], PGY-2

## 2014-09-11 ENCOUNTER — Ambulatory Visit (INDEPENDENT_AMBULATORY_CARE_PROVIDER_SITE_OTHER): Payer: Medicaid Other | Admitting: *Deleted

## 2014-09-11 DIAGNOSIS — Z111 Encounter for screening for respiratory tuberculosis: Secondary | ICD-10-CM

## 2014-09-11 NOTE — Progress Notes (Signed)
   PPD placed Left Forearm.  Pt to return 09/13/2014 for reading.  Pt tolerated intradermal injection. Martin, Tamika L, RN          

## 2014-09-13 ENCOUNTER — Encounter: Payer: Self-pay | Admitting: *Deleted

## 2014-09-13 ENCOUNTER — Ambulatory Visit (INDEPENDENT_AMBULATORY_CARE_PROVIDER_SITE_OTHER): Payer: Medicaid Other | Admitting: *Deleted

## 2014-09-13 ENCOUNTER — Encounter: Payer: Self-pay | Admitting: Family Medicine

## 2014-09-13 DIAGNOSIS — Z111 Encounter for screening for respiratory tuberculosis: Secondary | ICD-10-CM

## 2014-09-13 DIAGNOSIS — Z7689 Persons encountering health services in other specified circumstances: Secondary | ICD-10-CM

## 2014-09-13 LAB — TB SKIN TEST
Induration: 0 mm
TB Skin Test: NEGATIVE

## 2014-09-13 NOTE — Progress Notes (Signed)
   PPD Reading Note PPD read and results entered in EpicCare. Result: 0 mm induration. Interpretation: Negative If test not read within 48-72 hours of initial placement, patient advised to repeat in other arm 1-3 weeks after this test. Allergic reaction: no Pt is also requesting from her PCP that she received a physical.  Pt need letter for employment.  Will forward to PCP.  Clovis PuMartin, Lyna Laningham L, RN

## 2014-09-18 ENCOUNTER — Telehealth: Payer: Self-pay | Admitting: Family Medicine

## 2014-09-18 DIAGNOSIS — B9689 Other specified bacterial agents as the cause of diseases classified elsewhere: Secondary | ICD-10-CM

## 2014-09-18 DIAGNOSIS — N760 Acute vaginitis: Secondary | ICD-10-CM

## 2014-09-18 MED ORDER — METRONIDAZOLE 0.75 % VA GEL: 1.0000 | Freq: Two times a day (BID) | VAGINAL | Status: DC

## 2014-09-18 NOTE — Telephone Encounter (Signed)
Patient calling because she feels like the treatment is working for her issue and thought that she was "good" after she took the last dose on Friday 09/13/2014 and so she threw the remainder away. Although her symptoms improved before, they are starting again. Pt says that she noticed the "fishy smell" this morning and was wondering if she could get a refill on metroNIDAZOLE (METROGEL VAGINAL) so that she can complete the treatment. [facility] [address]. Please contact patient once it is complete or if she needs to make another appt / thanks Courtney Adams, ASA

## 2014-09-18 NOTE — Telephone Encounter (Signed)
Reviewed chart. Prior dx BV in clinic recently. Likely incomplete treatment if did not finish course. Sent refill Metrogel to [facility] as requested. No need to schedule follow-up at this time. If symptoms do not improve or worsen after next 7 to 10 days, then she may return to clinic for follow-up.  Called and unable to reach pt, LVM that refill sent, and to call with further questions if needed.  Saralyn PilarAlexander Karamalegos, DO [facility], PGY-2

## 2014-11-13 ENCOUNTER — Inpatient Hospital Stay (HOSPITAL_COMMUNITY)
Admission: AD | Admit: 2014-11-13 | Discharge: 2014-11-13 | Disposition: A | Discharge status: Home / Self Care | Payer: Medicaid Other | Source: Ambulatory Visit | Attending: Family Medicine | Admitting: Family Medicine

## 2014-11-13 ENCOUNTER — Encounter (HOSPITAL_COMMUNITY): Payer: Self-pay

## 2014-11-13 DIAGNOSIS — O99331 Smoking (tobacco) complicating pregnancy, first trimester: Secondary | ICD-10-CM | POA: Diagnosis not present

## 2014-11-13 DIAGNOSIS — Z3A01 Less than 8 weeks gestation of pregnancy: Secondary | ICD-10-CM | POA: Insufficient documentation

## 2014-11-13 DIAGNOSIS — F17210 Nicotine dependence, cigarettes, uncomplicated: Secondary | ICD-10-CM | POA: Insufficient documentation

## 2014-11-13 DIAGNOSIS — O9989 Other specified diseases and conditions complicating pregnancy, childbirth and the puerperium: Secondary | ICD-10-CM | POA: Diagnosis present

## 2014-11-13 DIAGNOSIS — N926 Irregular menstruation, unspecified: Secondary | ICD-10-CM

## 2014-11-13 DIAGNOSIS — N898 Other specified noninflammatory disorders of vagina: Secondary | ICD-10-CM

## 2014-11-13 DIAGNOSIS — O26891 Other specified pregnancy related conditions, first trimester: Secondary | ICD-10-CM

## 2014-11-13 LAB — URINALYSIS, ROUTINE W REFLEX MICROSCOPIC
Bilirubin Urine: NEGATIVE
Glucose, UA: NEGATIVE mg/dL
Hgb urine dipstick: NEGATIVE
Ketones, ur: NEGATIVE mg/dL
Leukocytes, UA: NEGATIVE
Nitrite: NEGATIVE
Protein, ur: NEGATIVE mg/dL
Specific Gravity, Urine: >1.03 — ABNORMAL HIGH (ref 1.005–1.030)
Urobilinogen, UA: 1 mg/dL (ref 0.0–1.0)
pH: 6 (ref 5.0–8.0)

## 2014-11-13 LAB — WET PREP, GENITAL
Clue Cells Wet Prep HPF POC: NONE SEEN
Trich, Wet Prep: NONE SEEN
Yeast Wet Prep HPF POC: NONE SEEN

## 2014-11-13 LAB — POCT PREGNANCY, URINE: Preg Test, Ur: POSITIVE — AB

## 2014-11-13 LAB — OB RESULTS CONSOLE GC/CHLAMYDIA: Gonorrhea: NEGATIVE

## 2014-11-13 NOTE — MAU Note (Signed)
Pt presents for confirmation of pregnancy and wants to be checked for STDs. Some back pain. Reports a yellow discharge. Denies vaginal bleeding.

## 2014-11-13 NOTE — MAU Note (Signed)
Pt states she started having sharp Left lower back pain x 2 days ago which is ongoing.

## 2014-11-13 NOTE — MAU Provider Note (Signed)
History     CSN: [id number]  Arrival date and time: 11/13/14 1006   First Provider Initiated Contact with Patient 11/13/14 1125      Chief Complaint  Patient presents with  . Possible Pregnancy  . Exposure to STD   HPI   Courtney Adams is a 29 y.o. female [phone] at 2086w2d who presents to MAU for a pregnancy verification letter. She had a positive pregnancy test at home. She is also wanting STD testing today. She has had a yellow vaginal discharge for the past week. The discharge does not have an odor. She does have a history of STD's is unsure if her partner has an STD.  She currently denies pain or bleeding   [facility]. Patient is currently on the phone with them now making a prenatal appointment.   OB History    Gravida Para Term Preterm AB TAB SAB Ectopic Multiple Living   5 1  1 3 3    1       Past Medical History  Diagnosis Date  . BV (bacterial vaginosis)   . Anxiety   . Preterm delivery   . Chlamydia   . Gonorrhea   . Trichomonas   . Bipolar 1 disorder   . Anxiety   . Anemia   . Abortion     Past Surgical History  Procedure Laterality Date  . Dilation and curettage of uterus      Family History  Problem Relation Age of Onset  . Anemia Mother   . Cervical cancer Sister     History  Substance Use Topics  . Smoking status: Current Some Day Smoker -- 0.20 packs/day    Types: Cigarettes  . Smokeless tobacco: Never Used     Comment: 2 -3 cigs a day  . Alcohol Use: No    Allergies: No Known Allergies  Prescriptions prior to admission  Medication Sig Dispense Refill Last Dose  . fluconazole (DIFLUCAN) 150 MG tablet Take 1 tablet (150 mg total) by mouth once. Take 2nd tablet on Day 4. 2 tablet 0   . levonorgestrel (PLAN B,NEXT CHOICE) 0.75 MG tablet Take 0.75 mg by mouth once.   Not Taking  . metroNIDAZOLE (METROGEL VAGINAL) 0.75 % vaginal gel Place 1 Applicatorful vaginally 2 (two) times daily. For 5  days 70 g 0   . Prenatal Vit-Fe Fumarate-FA (PRENATAL MULTIVITAMIN) TABS tablet Take 1 tablet by mouth daily at 12 noon.   Taking   Results for orders placed or performed during the hospital encounter of 11/13/14 (from the past 48 hour(s))  Pregnancy, urine POC     Status: Abnormal   Collection Time: 11/13/14 10:29 AM  Result Value Ref Range   Preg Test, Ur POSITIVE (A) NEGATIVE    Comment:        THE SENSITIVITY OF THIS METHODOLOGY IS >24 mIU/mL   Urinalysis, Routine w reflex microscopic (not at [facility])     Status: Abnormal   Collection Time: 11/13/14 10:30 AM  Result Value Ref Range   Color, Urine YELLOW YELLOW   APPearance CLEAR CLEAR   Specific Gravity, Urine >1.030 (H) 1.005 - 1.030   pH 6.0 5.0 - 8.0   Glucose, UA NEGATIVE NEGATIVE mg/dL   Hgb urine dipstick NEGATIVE NEGATIVE   Bilirubin Urine NEGATIVE NEGATIVE   Ketones, ur NEGATIVE NEGATIVE mg/dL   Protein, ur NEGATIVE NEGATIVE mg/dL   Urobilinogen, UA 1.0 0.0 - 1.0 mg/dL  Nitrite NEGATIVE NEGATIVE   Leukocytes, UA NEGATIVE NEGATIVE    Comment: MICROSCOPIC NOT DONE ON URINES WITH NEGATIVE PROTEIN, BLOOD, LEUKOCYTES, NITRITE, OR GLUCOSE <1000 mg/dL.  Wet prep, genital     Status: Abnormal   Collection Time: 11/13/14 11:15 AM  Result Value Ref Range   Yeast Wet Prep HPF POC NONE SEEN NONE SEEN   Trich, Wet Prep NONE SEEN NONE SEEN   Clue Cells Wet Prep HPF POC NONE SEEN NONE SEEN   WBC, Wet Prep HPF POC FEW (A) NONE SEEN    Comment: FEW BACTERIA SEEN      Review of Systems  Constitutional: Negative for fever and chills.  Gastrointestinal: Negative for nausea, vomiting, diarrhea and constipation.  Genitourinary: Negative for dysuria.   Physical Exam   Blood pressure 109/65, pulse 84, temperature 98.3 F (36.8 C), temperature source Oral, resp. rate 18, height  (1.702 m), weight 48.081 kg (106 lb), last menstrual period 10/07/2014, unknown if currently breastfeeding.  Physical Exam  Constitutional: She is  oriented to person, place, and time. She appears well-developed and well-nourished. No distress.  HENT:  Head: Normocephalic.  Eyes: Pupils are equal, round, and reactive to light.  Neck: Neck supple.  Genitourinary:  GC and wet prep collected by RN  Musculoskeletal: Normal range of motion.  Neurological: She is alert and oriented to person, place, and time.  Skin: Skin is warm. She is not diaphoretic.  Psychiatric: Her behavior is normal.    MAU Course  Procedures  None  MDM Wet prep GC UA  Assessment and Plan   A:  1. Missed period   2. Vaginal discharge during pregnancy, first trimester     P:  Discharge home in stable condition Start prenatal care ASAP First trimester warning signs discussed Pregnancy verification letter given    Duane Lope, NP 11/13/2014 11:47 AM

## 2014-11-14 LAB — GC/CHLAMYDIA PROBE AMP (~~LOC~~) NOT AT ARMC
Chlamydia: NEGATIVE
Neisseria Gonorrhea: NEGATIVE

## 2014-11-19 ENCOUNTER — Other Ambulatory Visit: Payer: Self-pay | Admitting: *Deleted

## 2014-11-19 DIAGNOSIS — Z3481 Encounter for supervision of other normal pregnancy, first trimester: Secondary | ICD-10-CM

## 2014-11-19 MED ORDER — PRENATAL PLUS/IRON 27-1 MG PO TABS: ORAL_TABLET | ORAL | Status: DC

## 2014-11-22 ENCOUNTER — Encounter: Payer: Self-pay | Admitting: Family Medicine

## 2014-11-22 ENCOUNTER — Ambulatory Visit (INDEPENDENT_AMBULATORY_CARE_PROVIDER_SITE_OTHER): Payer: Medicaid Other | Admitting: Family Medicine

## 2014-11-22 VITALS — BP 100/58 | HR 84 | Temp 98.3°F | Ht 67.0 in | Wt 104.0 lb

## 2014-11-22 DIAGNOSIS — O99891 Other specified diseases and conditions complicating pregnancy: Secondary | ICD-10-CM

## 2014-11-22 DIAGNOSIS — O9989 Other specified diseases and conditions complicating pregnancy, childbirth and the puerperium: Principal | ICD-10-CM

## 2014-11-22 DIAGNOSIS — M545 Low back pain, unspecified: Secondary | ICD-10-CM

## 2014-11-22 DIAGNOSIS — R630 Anorexia: Secondary | ICD-10-CM

## 2014-11-22 DIAGNOSIS — M549 Dorsalgia, unspecified: Secondary | ICD-10-CM | POA: Diagnosis not present

## 2014-11-22 DIAGNOSIS — M546 Pain in thoracic spine: Secondary | ICD-10-CM

## 2014-11-22 DIAGNOSIS — O26891 Other specified pregnancy related conditions, first trimester: Secondary | ICD-10-CM

## 2014-11-22 NOTE — Assessment & Plan Note (Signed)
No red flags.   -Patient given list of safe medications in pregnancy -Recommended cessation of marijuana -Can use Tylenol PRN -Heating pads -Return precautions reviewed -F/U with PCP as scheduled for routine care of if symptoms worsen.

## 2014-11-22 NOTE — Patient Instructions (Signed)
It was a pleasure seeing you today, Courtney Adams!  Information regarding what we discussed is included in this packet.  Please make an appointment to see Dr Malachi Paradise as soon as possible for your prenatal care.  In the meantime, continue to take your prenatal vitamin daily.  Refills have been sent into your pharmacy.  You should continue to eat foods that are nutritionally dense while you work on getting your appetite back.  (nuts, green leafy vegetables, fruits).  You may take Tylenol safely during pregnancy for aches and pains.  You can use a heating pad for back pain if needed.  Avoid sleeping with heating pad.  Please feel free to call our office at [phone] if any questions or concerns arise.  Warm Regards, Ashly M. Nadine Counts, DO  Safe Medications in Pregnancy   Acne: Benzoyl Peroxide Salicylic Acid  Backache/Headache: Tylenol: 2 regular strength every 4 hours OR              2 Extra strength every 6 hours  Colds/Coughs/Allergies: Benadryl (alcohol free) 25 mg every 6 hours as needed Breath right strips Claritin Cepacol throat lozenges Chloraseptic throat spray Cold-Eeze- up to three times per day Cough drops, alcohol free Flonase (by prescription only) Guaifenesin Mucinex Robitussin DM (plain only, alcohol free) Saline nasal spray/drops Sudafed (pseudoephedrine) & Actifed ** use only after [redacted] weeks gestation and if you do not have high blood pressure Tylenol Vicks Vaporub Zinc lozenges Zyrtec   Constipation: Colace Ducolax suppositories Fleet enema Glycerin suppositories Metamucil Milk of magnesia Miralax Senokot Smooth move tea  Diarrhea: Kaopectate Imodium A-D  *NO pepto Bismol  Hemorrhoids: Anusol Anusol HC Preparation H Tucks  Indigestion: Tums Maalox Mylanta Zantac  Pepcid  Insomnia: Benadryl (alcohol free) 25mg  every 6 hours as needed Tylenol PM Unisom, no Gelcaps  Leg Cramps: Tums MagGel  Nausea/Vomiting:   Bonine Dramamine Emetrol Ginger extract Sea bands Meclizine  Nausea medication to take during pregnancy:  Unisom (doxylamine succinate 25 mg tablets) Take one tablet daily at bedtime. If symptoms are not adequately controlled, the dose can be increased to a maximum recommended dose of two tablets daily (1/2 tablet in the morning, 1/2 tablet mid-afternoon and one at bedtime). Vitamin B6 100mg  tablets. Take one tablet twice a day (up to 200 mg per day).  Skin Rashes: Aveeno products Benadryl cream or 25mg  every 6 hours as needed Calamine Lotion 1% cortisone cream  Yeast infection: Gyne-lotrimin 7 Monistat 7   **If taking multiple medications, please check labels to avoid duplicating the same active ingredients **take medication as directed on the label ** Do not exceed 4000 mg of tylenol in 24 hours **Do not take medications that contain aspirin or ibuprofen

## 2014-11-22 NOTE — Progress Notes (Signed)
Patient ID: Courtney Adams, female   DOB: December 10, 1985, 29 y.o.   MRN: [id number]    Subjective: CC: back pain, decreased PO HPI: Patient is a 29 y.o. female presenting to clinic today for same day appointment. Concerns today include:  1. Back pain Patient reports that she has had generalized back pain (T's and L's) that is achy in nature.  Symptoms started about 2 weeks ago.  She reports that it is worse with standing and sitting for long periods of time.  She reports that it is present all day.  She has smoked marijuana with relief.  She has not tried any OTC medications or heat for pain relief.  She denies falls, injury, decreased sensation, numbness or tingling, urinary retention.  2. Decreased oral intake Patient reports that she has had decreased oral intake since about 4 weeks ago.  She reports poor appetite.  Denies early satiety, diarrhea, fatigue, nausea or vomiting.  She reports that with her prior pregnancy she had an increased appetite.  She is worried that there might be something wrong with the baby, esp in the setting of her back pain.  She thinks that the stress of her boyfriend's prior loss (he had a still born with another partner) is making her extra worried for miscarriage herself.  She reports that she continues to hydrate well.  Denies dizziness, weight loss.  Social History Reviewed: smoked marijuana but not tobacco FamHx and MedHx updated.  Please see EMR. Health Maintenance: needs Prenatal care  ROS: All other systems reviewed and are negative.  Objective: Office vital signs reviewed. BP 100/58 mmHg  Pulse 84  Temp(Src) 98.3 F (36.8 C) (Oral)  Ht 5\' 7"  (1.702 m)  Wt 104 lb (47.174 kg)  BMI 16.28 kg/m2  LMP 10/07/2014 (Approximate)  Physical Examination:  General: Awake, alert, thin, NAD HEENT: Normal, EOMI, MMM Cardio: RRR, S1S2 heard, no murmurs appreciated Pulm: CTAB, no wheezes, rhonchi or rales Extremities: WWP, No edema, cyanosis or clubbing; +2  pulses bilaterally MSK: Normal gait and station Spine: FROM, no bony deformities, mild TTP to paraspinal muscles with increased tone in thoracic paraspinal muscles, no midline TTP, no erythema or ecchymosis Skin: dry, intact, no rashes or lesions Neuro: Strength and sensation grossly intact, no focal deficits  Assessment: 29 y.o. female with back pain and decreased oral intake  Plan: See Problem List and After Visit Summary   Raliegh Ip, DO PGY-1, [facility]

## 2014-11-26 NOTE — Progress Notes (Signed)
I was preceptor the day of this visit.   

## 2014-12-10 ENCOUNTER — Telehealth: Payer: Self-pay | Admitting: Family Medicine

## 2014-12-10 NOTE — Telephone Encounter (Signed)
Pt called and needs us to write a letter confirming her pregnancy. She needs this first thing this morning since she has appointments with some state agencies to get help. She would like to pick this up ion about 1 hour. jw °

## 2014-12-10 NOTE — Telephone Encounter (Signed)
Pt called and needs us to write a letter confirming her pregnancy. She needs this first thing this morning since she has appointments with some state agencies to get help. She would like to pick this up ion about 1 hour. jw

## 2014-12-11 NOTE — Telephone Encounter (Signed)
Pregnancy verification letter placed up front, left message on patient voicemail informing patient.

## 2014-12-15 ENCOUNTER — Inpatient Hospital Stay (HOSPITAL_COMMUNITY)
Admission: AD | Admit: 2014-12-15 | Discharge: 2014-12-15 | Disposition: A | Discharge status: Home / Self Care | Payer: Medicaid Other | Source: Ambulatory Visit | Attending: Obstetrics | Admitting: Obstetrics

## 2014-12-15 ENCOUNTER — Encounter (HOSPITAL_COMMUNITY): Payer: Self-pay

## 2014-12-15 DIAGNOSIS — O9989 Other specified diseases and conditions complicating pregnancy, childbirth and the puerperium: Secondary | ICD-10-CM | POA: Insufficient documentation

## 2014-12-15 DIAGNOSIS — Z87891 Personal history of nicotine dependence: Secondary | ICD-10-CM | POA: Insufficient documentation

## 2014-12-15 DIAGNOSIS — L509 Urticaria, unspecified: Secondary | ICD-10-CM | POA: Insufficient documentation

## 2014-12-15 DIAGNOSIS — Z3A09 9 weeks gestation of pregnancy: Secondary | ICD-10-CM | POA: Diagnosis not present

## 2014-12-15 DIAGNOSIS — L299 Pruritus, unspecified: Secondary | ICD-10-CM | POA: Diagnosis present

## 2014-12-15 LAB — URINALYSIS, ROUTINE W REFLEX MICROSCOPIC
Bilirubin Urine: NEGATIVE
Glucose, UA: NEGATIVE mg/dL
Hgb urine dipstick: NEGATIVE
Ketones, ur: NEGATIVE mg/dL
Leukocytes, UA: NEGATIVE
Nitrite: NEGATIVE
Protein, ur: NEGATIVE mg/dL
Specific Gravity, Urine: >1.03 — ABNORMAL HIGH (ref 1.005–1.030)
Urobilinogen, UA: 0.2 mg/dL (ref 0.0–1.0)
pH: 5.5 (ref 5.0–8.0)

## 2014-12-15 MED ORDER — LORATADINE 10 MG PO TABS: 10.0000 mg | ORAL_TABLET | Freq: Every day | ORAL | Status: DC

## 2014-12-15 MED ORDER — DIPHENHYDRAMINE HCL 50 MG/ML IJ SOLN
25.0000 mg | Freq: Once | INTRAMUSCULAR | Status: AC
  Administered 2014-12-15: 25 mg via INTRAMUSCULAR
  Filled 2014-12-15: qty 1

## 2014-12-15 NOTE — MAU Provider Note (Signed)
  History     CSN: [id number]  Arrival date and time: 12/15/14 0711   First Provider Initiated Contact with Patient 12/15/14 0725      Chief Complaint  Patient presents with  . Pruritis   HPI  Ms. Courtney Adams is a 29 y.o. [id number] at 7044w6d here with report of believed insect bite on left forearm while sleeping.  Woke up to itching and rash that spread to arm and upper torso.  Denies any respiratory involvement.  Arm and chest continues to itch.  Used calamine lotion with little relief.  No systemic medication have been used.    Past Medical History  Diagnosis Date  . BV (bacterial vaginosis)   . Anxiety   . Preterm delivery   . Chlamydia   . Gonorrhea   . Trichomonas   . Bipolar 1 disorder   . Anxiety   . Anemia   . Abortion     Past Surgical History  Procedure Laterality Date  . Dilation and curettage of uterus      Family History  Problem Relation Age of Onset  . Anemia Mother   . Cervical cancer Sister     History  Substance Use Topics  . Smoking status: Former Smoker    Quit date: 11/13/2014  . Smokeless tobacco: Never Used  . Alcohol Use: No    Allergies: No Known Allergies  Prescriptions prior to admission  Medication Sig Dispense Refill Last Dose  . hydrocortisone 1 % ointment Apply 1 application topically 2 (two) times daily.   12/14/2014 at Unknown time  . Prenatal Vit-Fe Fumarate-FA (PRENATAL PLUS/IRON) 27-1 MG TABS Take one tablet daily during pregnancy. 30 each 11 Past Week at Unknown time  . metroNIDAZOLE (METROGEL VAGINAL) 0.75 % vaginal gel Place 1 Applicatorful vaginally 2 (two) times daily. For 5 days (Patient not taking: Reported on 12/15/2014) 70 g 0     Review of Systems  Constitutional: Negative for fever and chills.  HENT: Negative for sore throat.   Respiratory: Negative for cough, shortness of breath, wheezing and stridor.   Skin: Positive for itching and rash.  Neurological: Negative for headaches.  All other systems reviewed and  are negative.  Physical Exam   Blood pressure 116/76, pulse 91, temperature 99 F (37.2 C), temperature source Oral, resp. rate 16, height 5\' 7"  (1.702 m), weight 49.714 kg (109 lb 9.6 oz), last menstrual period 10/07/2014, unknown if currently breastfeeding.  Physical Exam  Constitutional: She is oriented to person, place, and time. She appears well-developed and well-nourished. No distress.  HENT:  Head: Normocephalic.  Neck: Normal range of motion. Neck supple.  Cardiovascular: Normal rate, regular rhythm and normal heart sounds.   Respiratory: Effort normal and breath sounds normal. No respiratory distress.  Musculoskeletal: Normal range of motion. She exhibits no edema.  Neurological: She is alert and oriented to person, place, and time. She has normal reflexes.  Skin: Skin is warm and dry.       MAU Course  Procedures  Medication:  25 mg benadryl IM  Assessment and Plan  Urticaria  Plan: Discharge to home Loratadine 10 mg po qd x 5 days Follow-up if develop fever or worsening of symptoms   KARIM, WALIDAH N 12/15/2014, 7:42 AM

## 2014-12-15 NOTE — MAU Note (Signed)
Pt presents to MAU with complaints of itching on her left arm and upper chest area. She noticed a small bug bite on her arm yesterday and has tried calamine lotion and alcohol on the area and it continues to itch. Denies any problems with the pregnancy at this time.

## 2014-12-15 NOTE — MAU Note (Signed)
Benadryl given, apple juice given.

## 2014-12-24 ENCOUNTER — Telehealth: Payer: Self-pay | Admitting: Family Medicine

## 2014-12-24 NOTE — Telephone Encounter (Signed)
Patient request a copy of letter stating she is pregnant. [facility] Please, follow up with Patient.

## 2014-12-24 NOTE — Telephone Encounter (Signed)
Pt came to pick up letter today from office. Reegan Bouffard Bruna PotterBlount, CMA

## 2014-12-27 ENCOUNTER — Other Ambulatory Visit: Payer: Medicaid Other

## 2014-12-27 DIAGNOSIS — Z3481 Encounter for supervision of other normal pregnancy, first trimester: Secondary | ICD-10-CM

## 2014-12-28 LAB — HIV ANTIBODY (ROUTINE TESTING W REFLEX): HIV 1&2 Ab, 4th Generation: NONREACTIVE

## 2014-12-28 LAB — SICKLE CELL SCREEN: Sickle Cell Screen: NEGATIVE

## 2014-12-29 LAB — CULTURE, OB URINE
Colony Count: NO GROWTH
Organism ID, Bacteria: NO GROWTH

## 2014-12-30 LAB — OBSTETRIC PANEL
Antibody Screen: NEGATIVE
Basophils Absolute: 0 10*3/uL (ref 0.0–0.1)
Basophils Relative: 0 % (ref 0–1)
Eosinophils Absolute: 0 10*3/uL (ref 0.0–0.7)
Eosinophils Relative: 1 % (ref 0–5)
HCT: 34.9 % — ABNORMAL LOW (ref 36.0–46.0)
Hemoglobin: 12.1 g/dL (ref 12.0–15.0)
Hepatitis B Surface Ag: NEGATIVE
Lymphocytes Relative: 33 % (ref 12–46)
Lymphs Abs: 1.4 10*3/uL (ref 0.7–4.0)
MCH: 32.6 pg (ref 26.0–34.0)
MCHC: 34.7 g/dL (ref 30.0–36.0)
MCV: 94.1 fL (ref 78.0–100.0)
MPV: 10 fL (ref 8.6–12.4)
Monocytes Absolute: 0.2 10*3/uL (ref 0.1–1.0)
Monocytes Relative: 5 % (ref 3–12)
Neutro Abs: 2.6 10*3/uL (ref 1.7–7.7)
Neutrophils Relative %: 61 % (ref 43–77)
Platelets: 218 10*3/uL (ref 150–400)
RBC: 3.71 MIL/uL — ABNORMAL LOW (ref 3.87–5.11)
RDW: 13.1 % (ref 11.5–15.5)
Rh Type: POSITIVE
Rubella: 2.48 Index — ABNORMAL HIGH (ref <0.90)
WBC: 4.3 10*3/uL (ref 4.0–10.5)

## 2015-01-03 ENCOUNTER — Encounter: Payer: Self-pay | Admitting: Family Medicine

## 2015-01-03 ENCOUNTER — Ambulatory Visit (INDEPENDENT_AMBULATORY_CARE_PROVIDER_SITE_OTHER): Payer: Medicaid Other | Admitting: Family Medicine

## 2015-01-03 ENCOUNTER — Other Ambulatory Visit (HOSPITAL_COMMUNITY)
Admission: RE | Admit: 2015-01-03 | Discharge: 2015-01-03 | Disposition: A | Discharge status: Home / Self Care | Payer: Medicaid Other | Source: Ambulatory Visit | Attending: Family Medicine | Admitting: Family Medicine

## 2015-01-03 VITALS — BP 126/81 | HR 97 | Temp 98.5°F | Wt 112.0 lb

## 2015-01-03 DIAGNOSIS — Z124 Encounter for screening for malignant neoplasm of cervix: Secondary | ICD-10-CM | POA: Diagnosis not present

## 2015-01-03 DIAGNOSIS — R519 Headache, unspecified: Secondary | ICD-10-CM | POA: Insufficient documentation

## 2015-01-03 DIAGNOSIS — Z720 Tobacco use: Secondary | ICD-10-CM

## 2015-01-03 DIAGNOSIS — N898 Other specified noninflammatory disorders of vagina: Secondary | ICD-10-CM | POA: Diagnosis not present

## 2015-01-03 DIAGNOSIS — Z01419 Encounter for gynecological examination (general) (routine) without abnormal findings: Secondary | ICD-10-CM | POA: Diagnosis present

## 2015-01-03 DIAGNOSIS — Z3481 Encounter for supervision of other normal pregnancy, first trimester: Secondary | ICD-10-CM

## 2015-01-03 DIAGNOSIS — O26891 Other specified pregnancy related conditions, first trimester: Secondary | ICD-10-CM | POA: Diagnosis not present

## 2015-01-03 DIAGNOSIS — R51 Headache: Secondary | ICD-10-CM | POA: Diagnosis not present

## 2015-01-03 DIAGNOSIS — B373 Candidiasis of vulva and vagina: Secondary | ICD-10-CM | POA: Diagnosis not present

## 2015-01-03 DIAGNOSIS — B3731 Acute candidiasis of vulva and vagina: Secondary | ICD-10-CM

## 2015-01-03 DIAGNOSIS — Z113 Encounter for screening for infections with a predominantly sexual mode of transmission: Secondary | ICD-10-CM | POA: Diagnosis present

## 2015-01-03 DIAGNOSIS — Z1151 Encounter for screening for human papillomavirus (HPV): Secondary | ICD-10-CM | POA: Diagnosis present

## 2015-01-03 DIAGNOSIS — Z3480 Encounter for supervision of other normal pregnancy, unspecified trimester: Secondary | ICD-10-CM | POA: Insufficient documentation

## 2015-01-03 DIAGNOSIS — O26899 Other specified pregnancy related conditions, unspecified trimester: Secondary | ICD-10-CM | POA: Insufficient documentation

## 2015-01-03 LAB — POCT WET PREP (WET MOUNT): Clue Cells Wet Prep Whiff POC: NEGATIVE

## 2015-01-03 MED ORDER — FLUCONAZOLE 150 MG PO TABS: 150.0000 mg | ORAL_TABLET | Freq: Once | ORAL | Status: DC

## 2015-01-03 NOTE — Assessment & Plan Note (Addendum)
Clinical yeast infection, confirm few yeast on wet prep - Treat with Diflucan  PO, repeat dose if persistent

## 2015-01-03 NOTE — Assessment & Plan Note (Signed)
Quick smoking 12/13/14

## 2015-01-03 NOTE — Progress Notes (Signed)
INITIAL PRENATAL VISIT S: Courtney Adams is a 29 y.o. [id number] at [redacted]w[redacted]d by LMP, with approx EDD 07/13/14, h/o early [place] at 8wk0d  Today reports overall doing well. Endorses frontal bilateral headache most days, intermittent episodes lasting few hours, improved with rest. Associated with stress. No other associated symptoms. - Additionally, previously followed by Dr. Francoise Ceo for OB/GYN care, prenatal care for current pregnancy now decides to switch to [facility]. She was tested for Chlamydia and positive earlier this month, symptomatic with vaginal discharge and some sharp abd pains, improved after antibiotic therapy. Still some vaginal discharge. - Father of baby is not involved in patient's life anymore. No longer sexually active with him. She has good support system with her own parents, sister, cousins. - Quit smoking 12/13/14, h/o tobacco abuse, no other drugs alcohol or other substances. - Taking PNV daily. Not taking any Tylenol. - Weight gain appropriate, normal appetite tolerating PO - Admits to mild nausea without vomiting - Denies any nausea, vision changes, edema, dysuria, RUQ abdominal pain  Fetal Movement - occasional Pain / Contractions - none Admits vaginal discharge without odor Denies any fluid leakage, vaginal bleeding  O: BP 126/81 mmHg  Pulse 97  Temp(Src) 98.5 F (36.9 C)  Wt 112 lb (50.803 kg)  LMP 10/07/2014 (Approximate)  Gen - well-appearing, NAD HEENT - NCAT, PERRL, oropharynx clear, MMM Neck - supple, non-tender, no thyromegaly Heart - RRR, no murmurs Lungs - CTAB, non-labored Abd - soft, appropriately gravid for GA, NTND, +active BS Back - non-tender, no deformity Ext - non-tender, no edema, peripheral pulses intact +2 b/l Pelvic Exam - Normal external female genitalia. Vaginal canal without lesions. Limited view of cervix, without lesions or bleeding. Increased thick discharge on exam. Bimanual exam without masses or cervical motion  tenderness. (Chaperoned by RN)  Breastfeeding - Planning to bottle formula feed, considering breastfeeding Contraception - Condoms (failed Nexplanon, family with Mirena), consider BTL  A&P: Normal supervision of pregnancy, in 1st trimester, at [redacted]w[redacted]d - cont daily PNV, regular appetite - Check pap smear today - Already had genetic screening tests done at Dr. Elsie Stain office, reportedly "normal", will try to obtain records - Add Varicella titer, unsure if history of chicken pox  # H/o prior Pre-Term Delivery at 32wk0d, with known PTL Note she was smoking, sexually active, during that pregnancy may have increased risk. Did not follow bed rest instructions. - Discussed starting 17P-Makena injections at [redacted]w[redacted]d, patient will consider this, if chooses will need to obtain injections at [facility] (not offered [facility]) and consider transfer to High Risk OB clinic  # Headache Most likely tension, stress related headaches. No concerns or other clinical features of other concerning HAs. GA < 20 wks, Bp stable no Pre-E. Advised may take Tylenol PRN for headaches,   # Vaginal Discharge - Reportedly history of positive chlamydia within 1 month at prior OB. Treated with antibiotics - Re-check GC/Chlamydia today  Return in 4 weeks for next [facility] visit - at approx 16-[redacted] wks GA - Will need Anatomy US, review history for early 1hr GTT (unlikely given no h/o DM, not obese, no family hx)  Saralyn Pilar, DO [facility], PGY-3

## 2015-01-03 NOTE — Patient Instructions (Addendum)
Dear Courtney Adams, Thank you for coming in to clinic today. It was good to see you!  1. For your pregnancy you are 12 weeks and 4 days in, due date is approx 07/14/15 2. All labs and prior testing normal. 3. Check chick pox immunization status today - (varicella) 4. Performed pap smear, also re-check chlmaydia / gonorrhea testing - results next week 5. You have some yeast - treat with Diflucan, take one tab, then repeat dose in 3 day if still symptoms 6. Take Tylenol for your headaches - extra strength up to 1000mg  per dose (3 times a day, MAX 3000mg  daily), NO ADVIL / IBUPROFEN 7. Your history of Preterm pregnancy at 32 weeks means, it is recommend you get 17P - Makena injections weekly starting at 16 weeks until you deliver or 37 weeks. We don't offer these injections here, [facility] find out more information. Discuss at next visit. Please consider this.  Please schedule a follow-up appointment with [facility] in 4 weeks  If you have any other questions or concerns, please feel free to call the clinic to contact me. You may also schedule an earlier appointment if necessary.  However, if your symptoms get significantly worse, please go to the Emergency Department to seek immediate medical attention.  Saralyn Pilar, DO [facility]   First Trimester of Pregnancy The first trimester of pregnancy is from week 1 until the end of week 12 (months 1 through 3). A week after a sperm fertilizes an egg, the egg will implant on the wall of the uterus. This embryo will begin to develop into a baby. Genes from you and your partner are forming the baby. The female genes determine whether the baby is a boy or a girl. At 6-8 weeks, the eyes and face are formed, and the heartbeat can be seen on ultrasound. At the end of 12 weeks, all the baby's organs are formed.  Now that you are pregnant, you will want to do everything you can to have a healthy  baby. Two of the most important things are to get good prenatal care and to follow your health care provider's instructions. Prenatal care is all the medical care you receive before the baby's birth. This care will help prevent, find, and treat any problems during the pregnancy and childbirth. BODY CHANGES Your body goes through many changes during pregnancy. The changes vary from woman to woman.   You may gain or lose a couple of pounds at first.  You may feel sick to your stomach (nauseous) and throw up (vomit). If the vomiting is uncontrollable, call your health care provider.  You may tire easily.  You may develop headaches that can be relieved by medicines approved by your health care provider.  You may urinate more often. Painful urination may mean you have a bladder infection.  You may develop heartburn as a result of your pregnancy.  You may develop constipation because certain hormones are causing the muscles that push waste through your intestines to slow down.  You may develop hemorrhoids or swollen, bulging veins (varicose veins).  Your breasts may begin to grow larger and become tender. Your nipples may stick out more, and the tissue that surrounds them (areola) may become darker.  Your gums may bleed and may be sensitive to brushing and flossing.  Dark spots or blotches (chloasma, mask of pregnancy) may develop on your face. This will likely fade after the baby  is born.  Your menstrual periods will stop.  You may have a loss of appetite.  You may develop cravings for certain kinds of food.  You may have changes in your emotions from day to day, such as being excited to be pregnant or being concerned that something may go wrong with the pregnancy and baby.  You may have more vivid and strange dreams.  You may have changes in your hair. These can include thickening of your hair, rapid growth, and changes in texture. Some women also have hair loss during or after  pregnancy, or hair that feels dry or thin. Your hair will most likely return to normal after your baby is born. WHAT TO EXPECT AT YOUR PRENATAL VISITS During a routine prenatal visit:  You will be weighed to make sure you and the baby are growing normally.  Your blood pressure will be taken.  Your abdomen will be measured to track your baby's growth.  The fetal heartbeat will be listened to starting around week 10 or 12 of your pregnancy.  Test results from any previous visits will be discussed. Your health care provider may ask you:  How you are feeling.  If you are feeling the baby move.  If you have had any abnormal symptoms, such as leaking fluid, bleeding, severe headaches, or abdominal cramping.  If you have any questions. Other tests that may be performed during your first trimester include:  Blood tests to find your blood type and to check for the presence of any previous infections. They will also be used to check for low iron levels (anemia) and Rh antibodies. Later in the pregnancy, blood tests for diabetes will be done along with other tests if problems develop.  Urine tests to check for infections, diabetes, or protein in the urine.  An ultrasound to confirm the proper growth and development of the baby.  An amniocentesis to check for possible genetic problems.  Fetal screens for spina bifida and Down syndrome.  You may need other tests to make sure you and the baby are doing well. HOME CARE INSTRUCTIONS  Medicines  Follow your health care provider's instructions regarding medicine use. Specific medicines may be either safe or unsafe to take during pregnancy.  Take your prenatal vitamins as directed.  If you develop constipation, try taking a stool softener if your health care provider approves. Diet  Eat regular, well-balanced meals. Choose a variety of foods, such as meat or vegetable-based protein, fish, milk and low-fat dairy products, vegetables, fruits,  and whole grain breads and cereals. Your health care provider will help you determine the amount of weight gain that is right for you.  Avoid raw meat and uncooked cheese. These carry germs that can cause birth defects in the baby.  Eating four or five small meals rather than three large meals a day may help relieve nausea and vomiting. If you start to feel nauseous, eating a few soda crackers can be helpful. Drinking liquids between meals instead of during meals also seems to help nausea and vomiting.  If you develop constipation, eat more high-fiber foods, such as fresh vegetables or fruit and whole grains. Drink enough fluids to keep your urine clear or pale yellow. Activity and Exercise  Exercise only as directed by your health care provider. Exercising will help you:  Control your weight.  Stay in shape.  Be prepared for labor and delivery.  Experiencing pain or cramping in the lower abdomen or low back is a good  sign that you should stop exercising. Check with your health care provider before continuing normal exercises.  Try to avoid standing for long periods of time. Move your legs often if you must stand in one place for a long time.  Avoid heavy lifting.  Wear low-heeled shoes, and practice good posture.  You may continue to have sex unless your health care provider directs you otherwise. Relief of Pain or Discomfort  Wear a good support bra for breast tenderness.   Take warm sitz baths to soothe any pain or discomfort caused by hemorrhoids. Use hemorrhoid cream if your health care provider approves.   Rest with your legs elevated if you have leg cramps or low back pain.  If you develop varicose veins in your legs, wear support hose. Elevate your feet for 15 minutes, 3-4 times a day. Limit salt in your diet. Prenatal Care  Schedule your prenatal visits by the twelfth week of pregnancy. They are usually scheduled monthly at first, then more often in the last 2 months  before delivery.  Write down your questions. Take them to your prenatal visits.  Keep all your prenatal visits as directed by your health care provider. Safety  Wear your seat belt at all times when driving.  Make a list of emergency phone numbers, including numbers for family, friends, the hospital, and police and fire departments. General Tips  Ask your health care provider for a referral to a local prenatal education class. Begin classes no later than at the beginning of month 6 of your pregnancy.  Ask for help if you have counseling or nutritional needs during pregnancy. Your health care provider can offer advice or refer you to specialists for help with various needs.  Do not use hot tubs, steam rooms, or saunas.  Do not douche or use tampons or scented sanitary pads.  Do not cross your legs for long periods of time.  Avoid cat litter boxes and soil used by cats. These carry germs that can cause birth defects in the baby and possibly loss of the fetus by miscarriage or stillbirth.  Avoid all smoking, herbs, alcohol, and medicines not prescribed by your health care provider. Chemicals in these affect the formation and growth of the baby.  Schedule a dentist appointment. At home, brush your teeth with a soft toothbrush and be gentle when you floss. SEEK MEDICAL CARE IF:   You have dizziness.  You have mild pelvic cramps, pelvic pressure, or nagging pain in the abdominal area.  You have persistent nausea, vomiting, or diarrhea.  You have a bad smelling vaginal discharge.  You have pain with urination.  You notice increased swelling in your face, hands, legs, or ankles. SEEK IMMEDIATE MEDICAL CARE IF:   You have a fever.  You are leaking fluid from your vagina.  You have spotting or bleeding from your vagina.  You have severe abdominal cramping or pain.  You have rapid weight gain or loss.  You vomit blood or material that looks like coffee grounds.  You are  exposed to [place] measles and have never had them.  You are exposed to fifth disease or chickenpox.  You develop a severe headache.  You have shortness of breath.  You have any kind of trauma, such as from a fall or a car accident. Document Released: 05/25/2001 Document Revised: 10/15/2013 Document Reviewed: 04/10/2013 [facility] Patient Information 2015 [place], [place]. This information is not intended to replace advice given to you by your health care provider. Make  sure you discuss any questions you have with your health care provider.  

## 2015-01-03 NOTE — Assessment & Plan Note (Signed)
Likely tension / stress related frontal headaches, improves with rest. No red flags. Not Pre-E < 20 wks. - Try Tylenol PRN, rest, monitor BP in future, warning symptoms

## 2015-01-03 NOTE — Assessment & Plan Note (Addendum)
History of positive chlamydia 12/2014 by Dr. Gaynell Face, treated with antibiotics - Re-check GC/Chlamydia - no prior positive tests in record

## 2015-01-06 ENCOUNTER — Telehealth: Payer: Self-pay | Admitting: Family Medicine

## 2015-01-06 LAB — VARICELLA ZOSTER ANTIBODY, IGG: Varicella IgG: 1661 Index — ABNORMAL HIGH (ref <135.00)

## 2015-01-06 NOTE — Telephone Encounter (Signed)
Pt called and would like to know if all her test results are back and what they are. jw

## 2015-01-07 NOTE — Telephone Encounter (Signed)
Patient informed of message from MD and that we would give her a call once all results were in.

## 2015-01-07 NOTE — Telephone Encounter (Signed)
See earlier note dated 7/26. Received update now with all lab results.  1. Pap smear 7/22 - Negative malignancy. No high risk HPV detected 2. Chlamydia - POSITIVE, on pap smear specimen (Gonorrhea was negative).  Called patient to notify of all results above. Of note patient was tested positive Chlamydia earlier 12/2014 treated by Dr. Gaynell Face with "solution". I reviewed her chart and talked to preceptor Dr. Lum Babe about this result. Plan to proceed with repeat Azithromycin 1g PO at Nurse Visit [facility] by Dorisann Frames, RN. Patient is no longer sexually active with father of baby, but advised to provide routine counseling and may need partner to seek treatment at health dept. Patient understood all results.  Called Dorisann Frames, RN to review case and she will call pt soon to schedule nurse visit ASAP for chlamydia treatment with Azithro PO 1g x 1 dose in office, verbal authorization given. To provide appropriate counseling, and completion of paperwork to return to Health Dept to report case.  Return to clinic for [facility] for next OB visit, pt was scheduled to see me 01/30/15, however I advised Tamika to see if she could switch this apt to [facility]. She will need test of cure at that time, and if still positive, consider alternative therapy with prolonged Amoxicillin or Erythromycin course.  Saralyn Pilar, DO [facility], PGY-3

## 2015-01-07 NOTE — Telephone Encounter (Signed)
Patient made an appointment in nurse clinic for positive chlamydia treatment on 01/08/2015 at 9 AM.  Next OB appt 01/23/15 at 11: 30 AM.  Verbal order given by Dr. Althea Charon to treat with Azithromycin 1 GM x 1 PO.  Clovis Pu, RN

## 2015-01-07 NOTE — Telephone Encounter (Signed)
Reviewed chart, last OV with initial prenatal visit with me on 01/03/15. Labs done at that visit included: 1. Pap smear - PENDING 2. GC / Chlamydia testing (with pap specimen) - PENDING 3. Varicella titer - Elevated, which is good and means she is immune. Nothing else needed 4. Wet prep - (results given during visit)  See above, her lab results for Pap / GC/Chlamydia are still PENDING. Could you please call patient to let her know some still pending, and I will call her in 1-3 days once results available.  Saralyn Pilar, DO [facility], PGY-3

## 2015-01-08 ENCOUNTER — Ambulatory Visit (INDEPENDENT_AMBULATORY_CARE_PROVIDER_SITE_OTHER): Payer: Medicaid Other | Admitting: *Deleted

## 2015-01-08 DIAGNOSIS — A749 Chlamydial infection, unspecified: Secondary | ICD-10-CM

## 2015-01-08 DIAGNOSIS — O98319 Other infections with a predominantly sexual mode of transmission complicating pregnancy, unspecified trimester: Secondary | ICD-10-CM

## 2015-01-08 LAB — CYTOLOGY - PAP

## 2015-01-08 MED ORDER — AZITHROMYCIN 500 MG PO TABS
1000.0000 mg | ORAL_TABLET | Freq: Once | ORAL | Status: AC
  Administered 2015-01-08: 1000 mg via ORAL

## 2015-01-08 NOTE — Progress Notes (Signed)
   Pt in nurse clinic for chlamydia treatment.  Azithromycin 1 GM  X 1 PO given by verbal order Dr. Althea Charon 01/07/15.  Advised patient to keep appt in OB clinic on 01/23/15 per Dr. Althea Charon.  Pt will have a test of cure at that time.  Patient advised no sex until partner has been tested/treated.  Patient stated understanding.  Clovis Pu, RN

## 2015-01-10 ENCOUNTER — Inpatient Hospital Stay (HOSPITAL_COMMUNITY)
Admission: AD | Admit: 2015-01-10 | Payer: Medicaid Other | Source: Ambulatory Visit | Admitting: Obstetrics & Gynecology

## 2015-01-13 ENCOUNTER — Emergency Department (HOSPITAL_COMMUNITY)
Admission: EM | Admit: 2015-01-13 | Discharge: 2015-01-13 | Disposition: A | Discharge status: Home / Self Care | Payer: Medicaid Other | Source: Home / Self Care

## 2015-01-13 ENCOUNTER — Encounter (HOSPITAL_COMMUNITY): Payer: Self-pay | Admitting: Emergency Medicine

## 2015-01-13 DIAGNOSIS — Z7251 High risk heterosexual behavior: Secondary | ICD-10-CM

## 2015-01-13 DIAGNOSIS — A749 Chlamydial infection, unspecified: Secondary | ICD-10-CM

## 2015-01-13 DIAGNOSIS — O98811 Other maternal infectious and parasitic diseases complicating pregnancy, first trimester: Principal | ICD-10-CM

## 2015-01-13 MED ORDER — AZITHROMYCIN 250 MG PO TABS
ORAL_TABLET | ORAL | Status: AC
  Filled 2015-01-13: qty 4

## 2015-01-13 MED ORDER — AZITHROMYCIN 1 G PO PACK
1.0000 g | PACK | Freq: Once | ORAL | Status: AC
  Administered 2015-01-13: 1 g via ORAL

## 2015-01-13 NOTE — ED Notes (Signed)
Pt was treated 5 days ago for Chlamydia with Zithromax.  Pt states she threw the medication up when she went home, but did not call her OB/GYN to let them know.  On July 29 she admits to having unprotected sex again and is concerned for another STD.  Pt is pregnant.

## 2015-01-13 NOTE — ED Provider Notes (Signed)
CSN: [id number]     Arrival date & time 01/13/15  1349 History   None    No chief complaint on file.   HPI   29 y.o. [id number]  approx EDD 07/13/14, h/o early [place] at 8wk0d recently seen for initial OB prenatal visit 01/03/15 As a part of workup had GC performed and was Chlamydia positive at that visit Note that the patient also was tested earlier in the month and was also positive for chlamydia at that time?? She is planning on having BTL this pregnancy She tells me that she vomited up the azithromycin that was given to her at family medicine and has not had any relief from the discharge  Other than the discharge and some discomfort she is doing well No nausea no vomiting No chest pain No bleeding No dysuria No shortness of breath  Past Medical History  Diagnosis Date  . BV (bacterial vaginosis)   . Anxiety   . Preterm delivery   . Chlamydia   . Gonorrhea   . Trichomonas   . Bipolar 1 disorder   . Anxiety   . Anemia   . Abortion    Past Surgical History  Procedure Laterality Date  . Dilation and curettage of uterus     Family History  Problem Relation Age of Onset  . Anemia Mother   . Cervical cancer Sister    History  Substance Use Topics  . Smoking status: Former Smoker    Quit date: 11/13/2014  . Smokeless tobacco: Never Used  . Alcohol Use: No   OB History    Gravida Para Term Preterm AB TAB SAB Ectopic Multiple Living   Obstetric Comments   - Reportedly had PTL, advised bedrest at 6 months, advised no sexual activity but continued and thinks this may have contributed to pre-term delivery. Delivery [facility]VD at [facility] hospital - NICU for 16 days     Review of Systems  Allergies  Review of patient's allergies indicates no known allergies.  Home Medications   Prior to Admission medications   Medication Sig Start Date End Date Taking? Authorizing Provider  fluconazole (DIFLUCAN) 150 MG tablet Take 1 tablet (150 mg total) by mouth once.  Repeat dose on Day 3 if symptoms persist. 01/03/15   Smitty Cords, DO  hydrocortisone 1 % ointment Apply 1 application topically 2 (two) times daily.    Historical Provider, MD  Prenatal Vit-Fe Fumarate-FA (PRENATAL PLUS/IRON) 27-1 MG TABS Take one tablet daily during pregnancy. 11/19/14   Smitty Cords, DO   LMP 10/07/2014 (Approximate) Physical Exam EOMI NCAT, no pallor Chest clinically clear no added sound Abdomen distended and pregnant No lower extremity edema No rash  ED Course  Procedures (including critical care time) Labs Review Labs Reviewed - No data to display  Imaging Review No results found.   MDM   1. Chlamydia infection affecting pregnancy, antepartum, first trimester   2. High risk sexual behavior    Patient has been able to tolerate in the past the packet of azithromycin 1000 mg when she was treated for this in the past however was not able to tolerate that to 500 mg tablets. I do think that we can try to treat her with azithromycin 4 X 250 mg pills here--if she is not able to tolerate this then we will need to consider getting the 1000 mg packet from the pharmacy and treatment  4:26 PM     Rhetta Mura, MD 01/13/15 1626

## 2015-01-13 NOTE — Discharge Instructions (Signed)
Chlamydia Chlamydia is an infection. It is spread through sexual contact. Chlamydia can be in different areas of the body. These areas include the cervix, urethra, throat, or rectum. You may not know you have chlamydia because many people never develop the symptoms. Chlamydia is not difficult to treat once you know you have it. However, if it is left untreated, chlamydia can lead to more serious health problems.  CAUSES  Chlamydia is caused by bacteria. It is a sexually transmitted disease. It is passed from an infected partner during intimate contact. This contact could be with the genitals, mouth, or rectal area. Chlamydia can also be passed from mothers to babies during birth. SIGNS AND SYMPTOMS  There may not be any symptoms. This is often the case early in the infection. If symptoms develop, they may include:  Mild pain and discomfort when urinating.  Redness, soreness, and swelling (inflammation) of the rectum.  Vaginal discharge.  Painful intercourse.  Abdominal pain.  Bleeding between menstrual periods. DIAGNOSIS  To diagnose this infection, your health care provider will do a pelvic exam. Cultures will be taken of the vagina, cervix, urine, and possibly the rectum to verify the diagnosis.  TREATMENT You will be given antibiotic medicines. If you are pregnant, certain types of antibiotics will need to be avoided. Any sexual partners should also be treated, even if they do not show symptoms.  HOME CARE INSTRUCTIONS   Take your antibiotic medicine as directed by your health care provider. Finish the antibiotic even if you start to feel better.  Take medicines only as directed by your health care provider.  Inform any sexual partners about the infection. They should also be treated.  Do not have sexual contact until your health care provider tells you it is okay.  Get plenty of rest.  Eat a well-balanced diet.  Drink enough fluids to keep your urine clear or pale  yellow.  Keep all follow-up visits as directed by your health care provider. SEEK MEDICAL CARE IF:  You have painful urination.  You have abdominal pain.  You have vaginal discharge.  You have painful sexual intercourse.  You have bleeding between periods and after sex.  You have a fever. SEEK IMMEDIATE MEDICAL CARE IF:   You experience nausea or vomiting.  You experience excessive sweating (diaphoresis).  You have difficulty swallowing. MAKE SURE YOU:   Understand these instructions.  Will watch your condition.  Will get help right away if you are not doing well or get worse. Document Released: 03/10/2005 Document Revised: 10/15/2013 Document Reviewed: 02/05/2013 [employer] Patient Information 2015 [employer]. This information is not intended to replace advice given to you by your health care provider. Make sure you discuss any questions you have with your health care provider.  

## 2015-01-30 ENCOUNTER — Encounter: Payer: Medicaid Other | Admitting: Family Medicine

## 2015-02-24 ENCOUNTER — Telehealth: Payer: Self-pay | Admitting: Family Medicine

## 2015-02-24 ENCOUNTER — Other Ambulatory Visit: Payer: Self-pay | Admitting: Family Medicine

## 2015-02-24 DIAGNOSIS — B373 Candidiasis of vulva and vagina: Secondary | ICD-10-CM

## 2015-02-24 DIAGNOSIS — B3731 Acute candidiasis of vulva and vagina: Secondary | ICD-10-CM

## 2015-02-24 NOTE — Telephone Encounter (Signed)
Courtney Adams is here picking up this letter and it is not what she asked for. It only states that she had a complete physical exam and that she was in good health to return to work. She needs a letter stating that she is in fact pregnant so that it may be utilized for a different appt. Please contact Courtney Adams at the earliest convenience once it is ready thank you, Dorothey Baseman, ASA

## 2015-02-24 NOTE — Telephone Encounter (Signed)
Left message on voicmail that letter was up front.

## 2015-02-24 NOTE — Telephone Encounter (Signed)
Letter placed up front, left message informing patient.

## 2015-02-24 NOTE — Telephone Encounter (Signed)
Pt is here and would like an appointment for a mammogram and also to seek advice as to whether or not it is safe for her pregnancy. Please contact pt at earliest convenience. Thank you, Dorothey Baseman, ASA

## 2015-02-24 NOTE — Telephone Encounter (Signed)
Left message for patient that mammograms are typically not advised during pregnancy and that insurance will not cover until age 31 unless the is a problem.

## 2015-02-24 NOTE — Telephone Encounter (Signed)
Patient is requesting a proof of pregnancy letter.

## 2015-02-25 NOTE — Telephone Encounter (Signed)
error 

## 2015-02-26 ENCOUNTER — Encounter: Payer: Self-pay | Admitting: Obstetrics

## 2015-02-26 ENCOUNTER — Ambulatory Visit (INDEPENDENT_AMBULATORY_CARE_PROVIDER_SITE_OTHER): Payer: Medicaid Other | Admitting: Obstetrics

## 2015-02-26 VITALS — BP 106/70 | HR 88 | Temp 99.3°F | Wt 126.0 lb

## 2015-02-26 DIAGNOSIS — O09892 Supervision of other high risk pregnancies, second trimester: Secondary | ICD-10-CM

## 2015-02-26 DIAGNOSIS — O0932 Supervision of pregnancy with insufficient antenatal care, second trimester: Secondary | ICD-10-CM | POA: Diagnosis not present

## 2015-02-26 DIAGNOSIS — B373 Candidiasis of vulva and vagina: Secondary | ICD-10-CM

## 2015-02-26 DIAGNOSIS — B3731 Acute candidiasis of vulva and vagina: Secondary | ICD-10-CM

## 2015-02-26 DIAGNOSIS — O09212 Supervision of pregnancy with history of pre-term labor, second trimester: Secondary | ICD-10-CM

## 2015-02-26 LAB — POCT URINALYSIS DIPSTICK
Bilirubin, UA: NEGATIVE
Blood, UA: NEGATIVE
Glucose, UA: NEGATIVE
Ketones, UA: NEGATIVE
Leukocytes, UA: NEGATIVE
Nitrite, UA: NEGATIVE
Protein, UA: NEGATIVE
Spec Grav, UA: 1.01
Urobilinogen, UA: NEGATIVE
pH, UA: 6.5

## 2015-02-26 MED ORDER — TERCONAZOLE 0.4 % VA CREA
1.0000 | TOPICAL_CREAM | Freq: Every day | VAGINAL | Status: DC
Start: 2015-02-26 — End: 2015-04-03

## 2015-02-26 NOTE — Progress Notes (Signed)
Subjective:    Courtney Adams is being seen today for her first obstetrical visit.  This is not a planned pregnancy. She is at [redacted]w[redacted]d gestation. Her obstetrical history is significant for previous preterm delivery. Relationship with FOB: significant other, not living together. Patient does intend to breast feed. Pregnancy history fully reviewed.  The information documented in the HPI was reviewed and verified.  Menstrual History: OB History    Gravida Para Term Preterm AB TAB SAB Ectopic Multiple Living   5 1  1 3 3    1       Obstetric Comments   - Reportedly had PTL, advised bedrest at 6 months, advised no sexual activity but continued and thinks this may have contributed to pre-term delivery. Delivery NSVD at [facility] hospital - NICU for 16 days       Patient's last menstrual period was 10/07/2014 (approximate).    Past Medical History  Diagnosis Date  . BV (bacterial vaginosis)   . Anxiety   . Preterm delivery   . Chlamydia   . Gonorrhea   . Trichomonas   . Bipolar 1 disorder   . Anxiety   . Anemia   . Abortion     Past Surgical History  Procedure Laterality Date  . Dilation and curettage of uterus       (Not in a hospital admission) No Known Allergies  Social History  Substance Use Topics  . Smoking status: Former Smoker    Quit date: 11/13/2014  . Smokeless tobacco: Never Used  . Alcohol Use: No    Family History  Problem Relation Age of Onset  . Anemia Mother   . Cervical cancer Sister      Review of Systems Constitutional: negative for weight loss Gastrointestinal: negative for vomiting Genitourinary:negative for genital lesions and vaginal discharge and dysuria Musculoskeletal:negative for back pain Behavioral/Psych: negative for abusive relationship, depression, illegal drug usage and tobacco use    Objective:    BP 106/70 mmHg  Pulse 88  Temp(Src) 99.3 F (37.4 C)  Wt 126 lb (57.153 kg)  LMP 10/07/2014 (Approximate) General Appearance:     Alert, cooperative, no distress, appears stated age  Head:    Normocephalic, without obvious abnormality, atraumatic  Eyes:    PERRL, conjunctiva/corneas clear, EOM's intact, fundi    benign, both eyes  Ears:    Normal TM's and external ear canals, both ears  Nose:   Nares normal, septum midline, mucosa normal, no drainage    or sinus tenderness  Throat:   Lips, mucosa, and tongue normal; teeth and gums normal  Neck:   Supple, symmetrical, trachea midline, no adenopathy;    thyroid:  no enlargement/tenderness/nodules; no carotid   bruit or JVD  Back:     Symmetric, no curvature, ROM normal, no CVA tenderness  Lungs:     Clear to auscultation bilaterally, respirations unlabored  Chest Wall:    No tenderness or deformity   Heart:    Regular rate and rhythm, S1 and S2 normal, no murmur, rub   or gallop  Breast Exam:    No tenderness, masses, or nipple abnormality  Abdomen:     Soft, non-tender, bowel sounds active all four quadrants,    no masses, no organomegaly  Genitalia:    Normal female without lesion, discharge or tenderness  Extremities:   Extremities normal, atraumatic, no cyanosis or edema  Pulses:   2+ and symmetric all extremities  Skin:   Skin color, texture, turgor normal, no rashes  or lesions  Lymph nodes:   Cervical, supraclavicular, and axillary nodes normal  Neurologic:   CNII-XII intact, normal strength, sensation and reflexes    throughout      Lab Review Urine pregnancy test Labs reviewed yes Radiologic studies reviewed no Assessment:    Pregnancy at [redacted]w[redacted]d weeks    Plan:      Prenatal vitamins.  Counseling provided regarding continued use of seat belts, cessation of alcohol consumption, smoking or use of illicit drugs; infection precautions i.e., influenza/TDAP immunizations, toxoplasmosis,CMV, parvovirus, listeria and varicella; workplace safety, exercise during pregnancy; routine dental care, safe medications, sexual activity, hot tubs, saunas, pools, travel,  caffeine use, fish and methlymercury, potential toxins, hair treatments, varicose veins Weight gain recommendations per IOM guidelines reviewed: underweight/BMI< 18.5--> gain 28 - 40 lbs; normal weight/BMI 18.5 - 24.9--> gain 25 - 35 lbs; overweight/BMI 25 - 29.9--> gain 15 - 25 lbs; obese/BMI >30->gain  11 - 20 lbs Problem list reviewed and updated. FIRST/CF mutation testing/NIPT/QUAD SCREEN/fragile X/Ashkenazi Jewish population testing/Spinal muscular atrophy discussed: requested. Role of ultrasound in pregnancy discussed; fetal survey: requested. Amniocentesis discussed: not indicated. VBAC calculator score: VBAC consent form provided Meds ordered this encounter  Medications  . terconazole (TERAZOL 7) 0.4 % vaginal cream    Sig: Place 1 applicator vaginally at bedtime.    Dispense:  45 g    Refill:  2   Orders Placed This Encounter  Procedures  . [place] MFM OB COMP + 14 WK    Standing Status: Future     Number of Occurrences:      Standing Expiration Date: 04/27/2016    Order Specific Question:  Reason for Exam (SYMPTOM  OR DIAGNOSIS REQUIRED)    Answer:  anatomic survey    Order Specific Question:  Preferred imaging location?    Answer:  [facility]-Ultrasound  . AMB Referral to Maternal Fetal Medicine (MFM)    Referral Priority:  Routine    Referral Type:  Consultation    Number of Visits Requested:  1    Follow up in 4 weeks.

## 2015-02-26 NOTE — Addendum Note (Signed)
Addended by: Marya Landry D on: 02/26/2015 04:46 PM   Modules accepted: Orders

## 2015-03-06 ENCOUNTER — Other Ambulatory Visit: Payer: Self-pay | Admitting: Obstetrics

## 2015-03-06 ENCOUNTER — Encounter (HOSPITAL_COMMUNITY): Payer: Self-pay

## 2015-03-06 ENCOUNTER — Ambulatory Visit (HOSPITAL_COMMUNITY)
Admission: RE | Admit: 2015-03-06 | Discharge: 2015-03-06 | Disposition: A | Discharge status: Home / Self Care | Payer: Medicaid Other | Source: Ambulatory Visit | Attending: Obstetrics | Admitting: Obstetrics

## 2015-03-06 DIAGNOSIS — O0932 Supervision of pregnancy with insufficient antenatal care, second trimester: Secondary | ICD-10-CM | POA: Diagnosis present

## 2015-03-06 DIAGNOSIS — Z3A21 21 weeks gestation of pregnancy: Secondary | ICD-10-CM | POA: Diagnosis not present

## 2015-03-06 DIAGNOSIS — O09212 Supervision of pregnancy with history of pre-term labor, second trimester: Secondary | ICD-10-CM | POA: Insufficient documentation

## 2015-03-06 DIAGNOSIS — O09892 Supervision of other high risk pregnancies, second trimester: Secondary | ICD-10-CM

## 2015-03-20 ENCOUNTER — Ambulatory Visit (HOSPITAL_COMMUNITY)
Admission: RE | Admit: 2015-03-20 | Discharge: 2015-03-20 | Disposition: A | Discharge status: Home / Self Care | Payer: Medicaid Other | Source: Ambulatory Visit | Attending: Obstetrics | Admitting: Obstetrics

## 2015-03-20 ENCOUNTER — Other Ambulatory Visit (HOSPITAL_COMMUNITY): Payer: Self-pay | Admitting: Maternal and Fetal Medicine

## 2015-03-20 DIAGNOSIS — O09212 Supervision of pregnancy with history of pre-term labor, second trimester: Secondary | ICD-10-CM | POA: Diagnosis present

## 2015-03-20 DIAGNOSIS — O09892 Supervision of other high risk pregnancies, second trimester: Secondary | ICD-10-CM

## 2015-03-20 DIAGNOSIS — Z3A23 23 weeks gestation of pregnancy: Secondary | ICD-10-CM

## 2015-03-26 ENCOUNTER — Encounter: Payer: Self-pay | Admitting: Obstetrics

## 2015-03-26 ENCOUNTER — Ambulatory Visit (INDEPENDENT_AMBULATORY_CARE_PROVIDER_SITE_OTHER): Payer: Medicaid Other | Admitting: Obstetrics

## 2015-03-26 VITALS — BP 99/68 | HR 80 | Wt 129.0 lb

## 2015-03-26 DIAGNOSIS — Z3482 Encounter for supervision of other normal pregnancy, second trimester: Secondary | ICD-10-CM

## 2015-03-26 DIAGNOSIS — Z113 Encounter for screening for infections with a predominantly sexual mode of transmission: Secondary | ICD-10-CM

## 2015-03-26 LAB — POCT URINALYSIS DIPSTICK
Bilirubin, UA: NEGATIVE
Blood, UA: NEGATIVE
Glucose, UA: NEGATIVE
Ketones, UA: NEGATIVE
Leukocytes, UA: NEGATIVE
Nitrite, UA: NEGATIVE
Protein, UA: NEGATIVE
Spec Grav, UA: 1.015
Urobilinogen, UA: NEGATIVE
pH, UA: 6

## 2015-03-26 NOTE — Progress Notes (Signed)
Subjective:    Courtney Adams is a 29 y.o. female being seen today for her obstetrical visit. She is at 5060w2d gestation. Patient reports: no complaints . Fetal movement: normal.  Problem List Items Addressed This Visit    None    Visit Diagnoses    Routine screening for STI (sexually transmitted infection)    -  Primary    Relevant Orders    SureSwab, Vaginosis/Vaginitis Plus    Encounter for supervision of other normal pregnancy in second trimester        Relevant Orders    POCT urinalysis dipstick (Completed)      Patient Active Problem List   Diagnosis Date Noted  . Headache in pregnancy 01/03/2015  . Supervision of other normal pregnancy 01/03/2015  . Yeast vaginitis 07/11/2014  . Dry skin 07/11/2014  . Contact dermatitis 01/22/2014  . Occipital lymphadenopathy 01/22/2014  . Hypotension, unspecified 01/22/2014  . Vaginal discharge 12/19/2013  . High risk sexual behavior 10/19/2013  . Hand cramps 10/08/2013  . Bacterial vaginosis 06/06/2013  . Pre-syncope 05/31/2013  . Back pain of thoracolumbar region 05/04/2013  . Diarrhea 05/04/2013  . Furunculus 03/28/2013  . Mood disorder (HCC) 06/19/2012  . Substance abuse 06/19/2012  . Contraception management 06/19/2012  . Health care maintenance 06/19/2012   Objective:    BP 99/68 mmHg  Pulse 80  Wt 129 lb (58.514 kg)  LMP 10/07/2014 (Approximate) FHT: 150 BPM  Uterine Size: size equals dates     Assessment:    Pregnancy @ 7960w2d    Plan:      Labs, problem list reviewed and updated 2 hr GTT planned Follow up in 4 weeks.

## 2015-03-26 NOTE — Progress Notes (Signed)
Pt states that she would like STD screening as she has not had a repeat test after positive history. Pt aware that we can test her today.

## 2015-04-01 LAB — SURESWAB, VAGINOSIS/VAGINITIS PLUS
Atopobium vaginae: NOT DETECTED Log (cells/mL)
BV CATEGORY: UNDETERMINED — AB
C. albicans, DNA: NOT DETECTED
C. glabrata, DNA: NOT DETECTED
C. parapsilosis, DNA: NOT DETECTED
C. trachomatis RNA, TMA: NOT DETECTED
C. tropicalis, DNA: NOT DETECTED
Gardnerella vaginalis: 7.3 Log (cells/mL)
LACTOBACILLUS SPECIES: 7.5 Log (cells/mL)
MEGASPHAERA SPECIES: 7.5 Log (cells/mL)
N. gonorrhoeae RNA, TMA: NOT DETECTED
T. vaginalis RNA, QL TMA: NOT DETECTED

## 2015-04-02 ENCOUNTER — Other Ambulatory Visit: Payer: Self-pay | Admitting: Obstetrics

## 2015-04-02 DIAGNOSIS — N760 Acute vaginitis: Secondary | ICD-10-CM

## 2015-04-02 DIAGNOSIS — B9689 Other specified bacterial agents as the cause of diseases classified elsewhere: Secondary | ICD-10-CM

## 2015-04-02 MED ORDER — METRONIDAZOLE 500 MG PO TABS: 500.0000 mg | ORAL_TABLET | Freq: Two times a day (BID) | ORAL | Status: DC

## 2015-04-03 ENCOUNTER — Ambulatory Visit (INDEPENDENT_AMBULATORY_CARE_PROVIDER_SITE_OTHER): Payer: Medicaid Other | Admitting: Family Medicine

## 2015-04-03 ENCOUNTER — Encounter: Payer: Self-pay | Admitting: Family Medicine

## 2015-04-03 ENCOUNTER — Ambulatory Visit (HOSPITAL_COMMUNITY): Payer: Medicaid Other

## 2015-04-03 VITALS — BP 90/60 | HR 84 | Temp 98.3°F | Ht 67.0 in | Wt 135.0 lb

## 2015-04-03 DIAGNOSIS — Z5989 Other problems related to housing and economic circumstances: Secondary | ICD-10-CM

## 2015-04-03 DIAGNOSIS — Z598 Other problems related to housing and economic circumstances: Secondary | ICD-10-CM | POA: Diagnosis present

## 2015-04-03 NOTE — Patient Instructions (Signed)
Thank you for coming in today. Please let me know if I need to edit or change the letter in any way. Also please reconsider getting the Flu Shot, you can call back at any time to schedule a Nurse Visit for a "Flu Clinic" to get the flu shot.  Follow with Dr. Clearance CootsHarper for your pregnancy and keep us updated.  Courtney PilarAlexander Loryn Haacke, DO [facility], PGY-3
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
## 2015-04-04 ENCOUNTER — Other Ambulatory Visit: Payer: Self-pay | Admitting: *Deleted

## 2015-04-04 DIAGNOSIS — B9689 Other specified bacterial agents as the cause of diseases classified elsewhere: Secondary | ICD-10-CM

## 2015-04-04 DIAGNOSIS — N760 Acute vaginitis: Secondary | ICD-10-CM

## 2015-04-04 MED ORDER — METRONIDAZOLE 0.75 % VA GEL: 1.0000 | Freq: Every day | VAGINAL | Status: DC

## 2015-04-04 NOTE — Progress Notes (Signed)
See lab note.  

## 2015-04-17 ENCOUNTER — Encounter (HOSPITAL_COMMUNITY): Payer: Self-pay

## 2015-04-17 ENCOUNTER — Ambulatory Visit (HOSPITAL_COMMUNITY)
Admission: RE | Admit: 2015-04-17 | Discharge: 2015-04-17 | Disposition: A | Discharge status: Home / Self Care | Payer: Medicaid Other | Source: Ambulatory Visit | Attending: Obstetrics | Admitting: Obstetrics

## 2015-04-17 ENCOUNTER — Other Ambulatory Visit (HOSPITAL_COMMUNITY): Payer: Self-pay | Admitting: Maternal and Fetal Medicine

## 2015-04-17 DIAGNOSIS — Z3A27 27 weeks gestation of pregnancy: Secondary | ICD-10-CM | POA: Diagnosis not present

## 2015-04-17 DIAGNOSIS — O09212 Supervision of pregnancy with history of pre-term labor, second trimester: Secondary | ICD-10-CM

## 2015-04-17 DIAGNOSIS — O09219 Supervision of pregnancy with history of pre-term labor, unspecified trimester: Secondary | ICD-10-CM | POA: Diagnosis present

## 2015-04-17 DIAGNOSIS — O09892 Supervision of other high risk pregnancies, second trimester: Secondary | ICD-10-CM

## 2015-04-23 ENCOUNTER — Encounter: Payer: Medicaid Other | Admitting: Obstetrics

## 2015-04-23 ENCOUNTER — Other Ambulatory Visit: Payer: Medicaid Other

## 2015-05-02 ENCOUNTER — Ambulatory Visit (INDEPENDENT_AMBULATORY_CARE_PROVIDER_SITE_OTHER): Payer: Medicaid Other | Admitting: Obstetrics

## 2015-05-02 VITALS — BP 111/69 | HR 115 | Temp 99.0°F | Wt 139.0 lb

## 2015-05-02 DIAGNOSIS — Z3483 Encounter for supervision of other normal pregnancy, third trimester: Secondary | ICD-10-CM

## 2015-05-02 LAB — POCT URINALYSIS DIPSTICK
Bilirubin, UA: NEGATIVE
Blood, UA: NEGATIVE
Glucose, UA: NEGATIVE
Ketones, UA: NEGATIVE
Leukocytes, UA: NEGATIVE
Nitrite, UA: NEGATIVE
Protein, UA: NEGATIVE
Spec Grav, UA: 1.015
Urobilinogen, UA: NEGATIVE
pH, UA: 6.5

## 2015-05-03 ENCOUNTER — Encounter: Payer: Self-pay | Admitting: Obstetrics

## 2015-05-03 NOTE — Progress Notes (Signed)
Subjective:    Courtney Adams is a 29 y.o. female being seen today for her obstetrical visit. She is at 5472w5d gestation. Patient reports no complaints. Fetal movement: normal.  Problem List Items Addressed This Visit    None    Visit Diagnoses    Supervision of other normal pregnancy, antepartum, second trimester    -  Primary    Relevant Orders    POCT urinalysis dipstick (Completed)      Patient Active Problem List   Diagnosis Date Noted  . Headache in pregnancy 01/03/2015  . Encounter for supervision of other normal pregnancy 01/03/2015  . Yeast vaginitis 07/11/2014  . Dry skin 07/11/2014  . Contact dermatitis 01/22/2014  . Occipital lymphadenopathy 01/22/2014  . Hypotension, unspecified 01/22/2014  . Vaginal discharge 12/19/2013  . High risk sexual behavior 10/19/2013  . Hand cramps 10/08/2013  . Bacterial vaginosis 06/06/2013  . Pre-syncope 05/31/2013  . Back pain of thoracolumbar region 05/04/2013  . Diarrhea 05/04/2013  . Furunculus 03/28/2013  . Mood disorder (HCC) 06/19/2012  . Substance abuse 06/19/2012  . Contraception management 06/19/2012  . Health care maintenance 06/19/2012   Objective:    BP 111/69 mmHg  Pulse 115  Temp(Src) 99 F (37.2 C)  Wt 139 lb (63.05 kg)  LMP 10/07/2014 (Approximate) FHT:  150 BPM  Uterine Size: size equals dates  Presentation: unsure     Assessment:    Pregnancy @ 7072w5d weeks   Plan:     labs reviewed, problem list updated Consent signed. GBS sent TDAP offered  Rhogam given for RH negative Pediatrician: discussed. Infant feeding: plans to breastfeed. Maternity leave: discussed. Cigarette smoking: former smoker. Orders Placed This Encounter  Procedures  . POCT urinalysis dipstick   No orders of the defined types were placed in this encounter.   Follow up in 2 Weeks.

## 2015-05-13 ENCOUNTER — Other Ambulatory Visit: Payer: Self-pay | Admitting: Certified Nurse Midwife

## 2015-05-15 ENCOUNTER — Encounter: Payer: Medicaid Other | Admitting: Obstetrics

## 2015-05-19 ENCOUNTER — Ambulatory Visit (INDEPENDENT_AMBULATORY_CARE_PROVIDER_SITE_OTHER): Payer: Medicaid Other | Admitting: Obstetrics

## 2015-05-19 ENCOUNTER — Encounter: Payer: Self-pay | Admitting: Obstetrics

## 2015-05-19 VITALS — BP 109/69 | HR 112 | Temp 98.0°F | Wt 141.0 lb

## 2015-05-19 DIAGNOSIS — Z3483 Encounter for supervision of other normal pregnancy, third trimester: Secondary | ICD-10-CM

## 2015-05-19 DIAGNOSIS — O09893 Supervision of other high risk pregnancies, third trimester: Secondary | ICD-10-CM

## 2015-05-19 DIAGNOSIS — O09213 Supervision of pregnancy with history of pre-term labor, third trimester: Secondary | ICD-10-CM

## 2015-05-20 ENCOUNTER — Encounter: Payer: Self-pay | Admitting: Obstetrics

## 2015-05-20 NOTE — Progress Notes (Signed)
Subjective:    Courtney EmerySophia D Adams is a 29 y.o. female being seen today for her obstetrical visit. She is at 516w1d gestation. Patient reports no complaints. Fetal movement: normal.  Problem List Items Addressed This Visit    None     Patient Active Problem List   Diagnosis Date Noted  . Headache in pregnancy 01/03/2015  . Encounter for supervision of other normal pregnancy 01/03/2015  . Yeast vaginitis 07/11/2014  . Dry skin 07/11/2014  . Contact dermatitis 01/22/2014  . Occipital lymphadenopathy 01/22/2014  . Hypotension, unspecified 01/22/2014  . Vaginal discharge 12/19/2013  . High risk sexual behavior 10/19/2013  . Hand cramps 10/08/2013  . Bacterial vaginosis 06/06/2013  . Pre-syncope 05/31/2013  . Back pain of thoracolumbar region 05/04/2013  . Diarrhea 05/04/2013  . Furunculus 03/28/2013  . Mood disorder (HCC) 06/19/2012  . Substance abuse 06/19/2012  . Contraception management 06/19/2012  . Health care maintenance 06/19/2012   Objective:    BP 109/69 mmHg  Pulse 112  Temp(Src) 98 F (36.7 C)  Wt 141 lb (63.957 kg)  LMP 10/07/2014 (Approximate) FHT:  150 BPM  Uterine Size: size equals dates  Presentation: unsure     Assessment:    Pregnancy @ 416w1d weeks   Plan:     labs reviewed, problem list updated Consent signed. GBS sent TDAP offered  Rhogam given for RH negative Pediatrician: discussed. Infant feeding: plans to breastfeed. Maternity leave: discussed. Cigarette smoking: former smoker. No orders of the defined types were placed in this encounter.   No orders of the defined types were placed in this encounter.   Follow up in 2 Weeks.

## 2015-05-28 ENCOUNTER — Other Ambulatory Visit: Payer: Self-pay | Admitting: Family Medicine

## 2015-05-28 DIAGNOSIS — B9689 Other specified bacterial agents as the cause of diseases classified elsewhere: Secondary | ICD-10-CM

## 2015-05-28 DIAGNOSIS — N760 Acute vaginitis: Secondary | ICD-10-CM

## 2015-05-28 MED ORDER — METRONIDAZOLE 0.75 % VA GEL: 1.0000 | Freq: Every day | VAGINAL | Status: DC

## 2015-05-28 NOTE — Telephone Encounter (Signed)
Continually having BV.  Is pregnant and would like to have the gel refilled [facility] [address]

## 2015-06-02 ENCOUNTER — Telehealth: Payer: Self-pay | Admitting: Obstetrics

## 2015-06-02 ENCOUNTER — Encounter: Payer: Medicaid Other | Admitting: Obstetrics

## 2015-06-02 NOTE — Telephone Encounter (Signed)
[id number] - Left patient a vm to please call and reschedule ROB appt. brm

## 2015-06-11 ENCOUNTER — Encounter: Payer: Self-pay | Admitting: Obstetrics

## 2015-06-11 ENCOUNTER — Ambulatory Visit (INDEPENDENT_AMBULATORY_CARE_PROVIDER_SITE_OTHER): Payer: Medicaid Other | Admitting: Obstetrics

## 2015-06-11 VITALS — BP 106/68 | HR 101 | Temp 98.5°F | Wt 150.0 lb

## 2015-06-11 DIAGNOSIS — Z3483 Encounter for supervision of other normal pregnancy, third trimester: Secondary | ICD-10-CM

## 2015-06-11 DIAGNOSIS — G4700 Insomnia, unspecified: Secondary | ICD-10-CM

## 2015-06-11 LAB — POCT URINALYSIS DIPSTICK
Bilirubin, UA: NEGATIVE
Blood, UA: NEGATIVE
Glucose, UA: NEGATIVE
Ketones, UA: NEGATIVE
Leukocytes, UA: NEGATIVE
Nitrite, UA: NEGATIVE
Protein, UA: NEGATIVE
Spec Grav, UA: 1.015
Urobilinogen, UA: NEGATIVE
pH, UA: 5

## 2015-06-11 MED ORDER — ZOLPIDEM TARTRATE 5 MG PO TABS: 5.0000 mg | ORAL_TABLET | Freq: Every evening | ORAL | Status: DC | PRN

## 2015-06-11 NOTE — Progress Notes (Signed)
Subjective:    Courtney Adams is a 29 y.o. female being seen today for her obstetrical visit. She is at 4126w2d gestation. Patient reports occasional contractions and pressure. Fetal movement: normal.  Problem List Items Addressed This Visit    None    Visit Diagnoses    Insomnia    -  Primary    Relevant Medications    zolpidem (AMBIEN) 5 MG tablet      Patient Active Problem List   Diagnosis Date Noted  . Headache in pregnancy 01/03/2015  . Encounter for supervision of other normal pregnancy 01/03/2015  . Yeast vaginitis 07/11/2014  . Dry skin 07/11/2014  . Contact dermatitis 01/22/2014  . Occipital lymphadenopathy 01/22/2014  . Hypotension, unspecified 01/22/2014  . Vaginal discharge 12/19/2013  . High risk sexual behavior 10/19/2013  . Hand cramps 10/08/2013  . Bacterial vaginosis 06/06/2013  . Pre-syncope 05/31/2013  . Back pain of thoracolumbar region 05/04/2013  . Diarrhea 05/04/2013  . Furunculus 03/28/2013  . Mood disorder (HCC) 06/19/2012  . Substance abuse 06/19/2012  . Contraception management 06/19/2012  . Health care maintenance 06/19/2012   Objective:    BP 106/68 mmHg  Pulse 101  Temp(Src) 98.5 F (36.9 C)  Wt 150 lb (68.04 kg)  LMP 10/07/2014 (Approximate) FHT:  150 BPM  Uterine Size: size equals dates  Presentation: cephalic     Assessment:    Pregnancy @ 6026w2d weeks   Plan:     labs reviewed, problem list updated Consent signed. GBS sent TDAP offered  Rhogam given for RH negative Pediatrician: discussed. Infant feeding: plans to breastfeed. Maternity leave: discussed. Cigarette smoking: former smoker. No orders of the defined types were placed in this encounter.   Meds ordered this encounter  Medications  . zolpidem (AMBIEN) 5 MG tablet    Sig: Take 1 tablet (5 mg total) by mouth at bedtime as needed for sleep.    Dispense:  30 tablet    Refill:  0   Follow up in 1 Week.

## 2015-06-11 NOTE — Addendum Note (Signed)
Addended by: Henriette CombsHATTON, Jaquitta Dupriest L on: 06/11/2015 04:40 PM   Modules accepted: Orders

## 2015-06-12 LAB — STREP B DNA PROBE: GBSP: NOT DETECTED

## 2015-06-12 NOTE — Telephone Encounter (Signed)
appt made

## 2015-06-15 NOTE — L&D Delivery Note (Signed)
Delivery Note At 12:24 AM a viable female was delivered via Vaginal, Spontaneous Delivery (Presentation: ; Occiput Anterior).  APGAR: 9, 9; weight  .   Placenta status: Intact, Spontaneous.  Cord: 3 vessels with the following complications: None.  Cord pH: not done  Anesthesia: Epidural  Episiotomy: None Lacerations: None Suture Repair: 2.0 Est. Blood Loss (mL): 300  Mom to postpartum.  Baby to Couplet care / Skin to Skin.  MARSHALL,BERNARD A 07/04/2015, 12:56 AM

## 2015-06-18 ENCOUNTER — Encounter: Payer: Medicaid Other | Admitting: Obstetrics

## 2015-06-23 ENCOUNTER — Encounter (HOSPITAL_COMMUNITY): Payer: Self-pay | Admitting: *Deleted

## 2015-06-23 ENCOUNTER — Encounter: Payer: Medicaid Other | Admitting: Obstetrics

## 2015-06-23 ENCOUNTER — Inpatient Hospital Stay (HOSPITAL_COMMUNITY)
Admission: AD | Admit: 2015-06-23 | Discharge: 2015-06-23 | Disposition: A | Discharge status: Home / Self Care | Payer: Medicaid Other | Source: Ambulatory Visit | Attending: Obstetrics | Admitting: Obstetrics

## 2015-06-23 DIAGNOSIS — Z3493 Encounter for supervision of normal pregnancy, unspecified, third trimester: Secondary | ICD-10-CM | POA: Insufficient documentation

## 2015-06-23 NOTE — MAU Note (Signed)
Pt states "I'm uncomfortable & I'm in pain."  C/O lower abd pain for the past 2 days, unsure if it is contractions.  Denies bleeding or LOF.  Also C/O pelvic pressure.

## 2015-06-23 NOTE — Discharge Instructions (Signed)
Keep your scheduled appointment for prenatal care. °

## 2015-06-26 ENCOUNTER — Telehealth: Payer: Self-pay | Admitting: Obstetrics

## 2015-06-26 ENCOUNTER — Encounter: Payer: Medicaid Other | Admitting: Obstetrics

## 2015-07-03 ENCOUNTER — Encounter (HOSPITAL_COMMUNITY): Payer: Self-pay | Admitting: *Deleted

## 2015-07-03 ENCOUNTER — Inpatient Hospital Stay (HOSPITAL_COMMUNITY): Payer: Medicaid Other

## 2015-07-03 ENCOUNTER — Inpatient Hospital Stay (HOSPITAL_COMMUNITY): Payer: Medicaid Other | Admitting: Anesthesiology

## 2015-07-03 ENCOUNTER — Other Ambulatory Visit: Payer: Self-pay | Admitting: Obstetrics

## 2015-07-03 ENCOUNTER — Inpatient Hospital Stay (HOSPITAL_COMMUNITY)
Admission: AD | Admit: 2015-07-03 | Discharge: 2015-07-05 | DRG: 775 | Disposition: A | Discharge status: Home / Self Care | Payer: Medicaid Other | Source: Ambulatory Visit | Attending: Obstetrics | Admitting: Obstetrics

## 2015-07-03 ENCOUNTER — Ambulatory Visit (INDEPENDENT_AMBULATORY_CARE_PROVIDER_SITE_OTHER): Payer: Medicaid Other | Admitting: Obstetrics

## 2015-07-03 VITALS — BP 121/81 | HR 117 | Temp 98.6°F | Wt 155.0 lb

## 2015-07-03 DIAGNOSIS — O9081 Anemia of the puerperium: Principal | ICD-10-CM | POA: Diagnosis not present

## 2015-07-03 DIAGNOSIS — Z3483 Encounter for supervision of other normal pregnancy, third trimester: Secondary | ICD-10-CM

## 2015-07-03 DIAGNOSIS — Z87891 Personal history of nicotine dependence: Secondary | ICD-10-CM

## 2015-07-03 DIAGNOSIS — Z3A38 38 weeks gestation of pregnancy: Secondary | ICD-10-CM

## 2015-07-03 DIAGNOSIS — IMO0001 Reserved for inherently not codable concepts without codable children: Secondary | ICD-10-CM

## 2015-07-03 DIAGNOSIS — D649 Anemia, unspecified: Secondary | ICD-10-CM | POA: Diagnosis not present

## 2015-07-03 DIAGNOSIS — O368390 Maternal care for abnormalities of the fetal heart rate or rhythm, unspecified trimester, not applicable or unspecified: Secondary | ICD-10-CM

## 2015-07-03 HISTORY — DX: Unspecified infectious disease: B99.9

## 2015-07-03 LAB — POCT URINALYSIS DIPSTICK
Bilirubin, UA: NEGATIVE
Blood, UA: NEGATIVE
Ketones, UA: NEGATIVE
Leukocytes, UA: NEGATIVE
Nitrite, UA: NEGATIVE
Protein, UA: NEGATIVE
Spec Grav, UA: 1.01
Urobilinogen, UA: NEGATIVE
pH, UA: 6

## 2015-07-03 LAB — CBC
HCT: 33.1 % — ABNORMAL LOW (ref 36.0–46.0)
Hemoglobin: 10.7 g/dL — ABNORMAL LOW (ref 12.0–15.0)
MCH: 31.7 pg (ref 26.0–34.0)
MCHC: 32.3 g/dL (ref 30.0–36.0)
MCV: 97.9 fL (ref 78.0–100.0)
Platelets: 255 10*3/uL (ref 150–400)
RBC: 3.38 MIL/uL — ABNORMAL LOW (ref 3.87–5.11)
RDW: 13.3 % (ref 11.5–15.5)
WBC: 7.2 10*3/uL (ref 4.0–10.5)

## 2015-07-03 LAB — TYPE AND SCREEN
ABO/RH(D): O POS
Antibody Screen: NEGATIVE

## 2015-07-03 LAB — POCT FERN TEST: POCT Fern Test: NEGATIVE

## 2015-07-03 MED ORDER — LIDOCAINE HCL (PF) 1 % IJ SOLN
30.0000 mL | INTRAMUSCULAR | Status: DC | PRN
  Filled 2015-07-03: qty 30

## 2015-07-03 MED ORDER — PHENYLEPHRINE 40 MCG/ML (10ML) SYRINGE FOR IV PUSH (FOR BLOOD PRESSURE SUPPORT)
80.0000 ug | PREFILLED_SYRINGE | INTRAVENOUS | Status: DC | PRN
  Filled 2015-07-03: qty 20
  Filled 2015-07-03: qty 2

## 2015-07-03 MED ORDER — OXYTOCIN 10 UNIT/ML IJ SOLN
2.5000 [IU]/h | INTRAVENOUS | Status: DC
  Filled 2015-07-03: qty 4

## 2015-07-03 MED ORDER — FENTANYL 2.5 MCG/ML BUPIVACAINE 1/10 % EPIDURAL INFUSION (WH - ANES)
14.0000 mL/h | INTRAMUSCULAR | Status: DC | PRN
  Administered 2015-07-03: 14 mL/h via EPIDURAL
  Filled 2015-07-03: qty 125

## 2015-07-03 MED ORDER — LACTATED RINGERS IV SOLN: 500.0000 mL | INTRAVENOUS | Status: DC | PRN

## 2015-07-03 MED ORDER — OXYTOCIN BOLUS FROM INFUSION
500.0000 mL | INTRAVENOUS | Status: DC
  Administered 2015-07-04: 500 mL via INTRAVENOUS

## 2015-07-03 MED ORDER — LACTATED RINGERS IV SOLN: INTRAVENOUS | Status: DC

## 2015-07-03 MED ORDER — DIPHENHYDRAMINE HCL 50 MG/ML IJ SOLN: 12.5000 mg | INTRAMUSCULAR | Status: DC | PRN

## 2015-07-03 MED ORDER — ONDANSETRON HCL 4 MG/2ML IJ SOLN: 4.0000 mg | Freq: Four times a day (QID) | INTRAMUSCULAR | Status: DC | PRN

## 2015-07-03 MED ORDER — FLEET ENEMA 7-19 GM/118ML RE ENEM: 1.0000 | ENEMA | RECTAL | Status: DC | PRN

## 2015-07-03 MED ORDER — LIDOCAINE HCL (PF) 1 % IJ SOLN
INTRAMUSCULAR | Status: DC | PRN
  Administered 2015-07-03: 6 mL via EPIDURAL
  Administered 2015-07-03: 9 mL via EPIDURAL

## 2015-07-03 MED ORDER — CITRIC ACID-SODIUM CITRATE 334-500 MG/5ML PO SOLN: 30.0000 mL | ORAL | Status: DC | PRN

## 2015-07-03 MED ORDER — EPHEDRINE 5 MG/ML INJ
10.0000 mg | INTRAVENOUS | Status: DC | PRN
  Filled 2015-07-03: qty 2

## 2015-07-03 MED ORDER — BUTORPHANOL TARTRATE 1 MG/ML IJ SOLN: 1.0000 mg | INTRAMUSCULAR | Status: DC | PRN

## 2015-07-03 MED ORDER — OXYCODONE-ACETAMINOPHEN 5-325 MG PO TABS: 2.0000 | ORAL_TABLET | ORAL | Status: DC | PRN

## 2015-07-03 MED ORDER — ACETAMINOPHEN 325 MG PO TABS: 650.0000 mg | ORAL_TABLET | ORAL | Status: DC | PRN

## 2015-07-03 MED ORDER — OXYCODONE-ACETAMINOPHEN 5-325 MG PO TABS: 1.0000 | ORAL_TABLET | ORAL | Status: DC | PRN

## 2015-07-03 NOTE — Anesthesia Preprocedure Evaluation (Signed)
Anesthesia Evaluation  Patient identified by MRN, date of birth, ID band Patient awake    Reviewed: Allergy & Precautions, H&P , NPO status , Patient's Chart, lab work & pertinent test results  Airway Mallampati: I  TM Distance: >3 FB Neck ROM: full    Dental no notable dental hx.    Pulmonary former smoker,    Pulmonary exam normal        Cardiovascular negative cardio ROS Normal cardiovascular exam     Neuro/Psych    GI/Hepatic negative GI ROS, Neg liver ROS,   Endo/Other  negative endocrine ROS  Renal/GU negative Renal ROS     Musculoskeletal   Abdominal Normal abdominal exam  (+)   Peds  Hematology negative hematology ROS (+)   Anesthesia Other Findings   Reproductive/Obstetrics (+) Pregnancy                             Anesthesia Physical Anesthesia Plan  ASA: II  Anesthesia Plan: Epidural   Post-op Pain Management:    Induction:   Airway Management Planned:   Additional Equipment:   Intra-op Plan:   Post-operative Plan:   Informed Consent: I have reviewed the patients History and Physical, chart, labs and discussed the procedure including the risks, benefits and alternatives for the proposed anesthesia with the patient or authorized representative who has indicated his/her understanding and acceptance.     Plan Discussed with:   Anesthesia Plan Comments:         Anesthesia Quick Evaluation  

## 2015-07-03 NOTE — MAU Note (Signed)
Off and on yesterday.  Really bad started about an hour ago.  Hx of PTD with first preg

## 2015-07-03 NOTE — MAU Note (Signed)
Pain and pressure, feels wet, doesn't know what is going on down there.  Was 1 cm last wk

## 2015-07-03 NOTE — Progress Notes (Signed)
Patient to Courtney Adams, monitors off.

## 2015-07-03 NOTE — Anesthesia Procedure Notes (Signed)
Epidural Patient location during procedure: OB Start time: 07/03/2015 10:11 PM End time: 07/03/2015 10:15 PM  Staffing Anesthesiologist: Leilani Able Performed by: anesthesiologist   Preanesthetic Checklist Completed: patient identified, surgical consent, pre-op evaluation, timeout performed, IV checked, risks and benefits discussed and monitors and equipment checked  Epidural Patient position: sitting Prep: site prepped and draped and DuraPrep Patient monitoring: continuous pulse ox and blood pressure Approach: midline Location: L3-L4 Injection technique: LOR air  Needle:  Needle type: Tuohy  Needle gauge: 17 G Needle length: 9 cm and 9 Needle insertion depth: 6 cm Catheter type: closed end flexible Catheter size: 19 Gauge Catheter at skin depth: 11 cm Test dose: negative and Other  Assessment Sensory level: T9 Events: blood not aspirated, injection not painful, no injection resistance, negative IV test and no paresthesia  Additional Notes Reason for block:procedure for pain

## 2015-07-03 NOTE — MAU Provider Note (Signed)
History     CSN: [id number]  Arrival date and time: 07/03/15 1807   First Provider Initiated Contact with Patient 07/03/15 1916      Chief Complaint  Patient presents with  . Labor Eval   HPI   [name] is a 30 y.o. female [phone] at [redacted]w[redacted]d presenting to MAU with contractions. I was asked to review the fetal tracing with the RN.   She denies vaginal bleeding. She does attest to an increase in vaginal discharge.   OB History    Gravida Para Term Preterm AB TAB SAB Ectopic Multiple Living   Obstetric Comments   - Reportedly had PTL, advised bedrest at 6 months, advised no sexual activity but continued and thinks this may have contributed to pre-term delivery. Delivery NSVD at [facility] hospital - NICU for 16 days      Past Medical History  Diagnosis Date  . BV (bacterial vaginosis)   . Anxiety   . Preterm delivery   . Chlamydia   . Gonorrhea   . Trichomonas   . Bipolar 1 disorder (HCC)   . Anxiety   . Anemia   . Abortion   . Preterm labor   . Infection     UTI    Past Surgical History  Procedure Laterality Date  . Dilation and curettage of uterus      Family History  Problem Relation Age of Onset  . Anemia Mother   . Cervical cancer Sister   . Diabetes Maternal Grandmother   . Heart disease Paternal Grandmother   . Heart disease Paternal Grandfather   . Stroke Paternal Uncle     Social History  Substance Use Topics  . Smoking status: Former Smoker    Quit date: 11/13/2014  . Smokeless tobacco: Never Used  . Alcohol Use: No    Allergies: No Known Allergies  Prescriptions prior to admission  Medication Sig Dispense Refill Last Dose  . zolpidem (AMBIEN) 5 MG tablet Take 1 tablet (5 mg total) by mouth at bedtime as needed for sleep. 30 tablet 0 07/02/2015 at Unknown time  . Prenatal Vit-Fe Fumarate-FA (PRENATAL PLUS/IRON) 27-1 MG TABS Take one tablet daily during pregnancy. (Patient not taking: Reported on 06/11/2015) 30  each 11 Not Taking   Results for orders placed or performed during the hospital encounter of 07/03/15 (from the past 48 hour(s))  Fern Test     Status: None   Collection Time: 07/03/15  7:17 PM  Result Value Ref Range   POCT Fern Test Negative = intact amniotic membranes     Review of Systems  Constitutional: Negative for fever and chills.  Gastrointestinal: Positive for abdominal pain (contraction like pain. ). Negative for nausea and vomiting.   Physical Exam   Blood pressure 103/49, pulse 87, temperature 97.7 F (36.5 C), temperature source Oral, resp. rate 18, last menstrual period 10/07/2014, unknown if currently breastfeeding.  Physical Exam  Constitutional: She is oriented to person, place, and time. She appears well-developed and well-nourished.  GI: Soft.  Genitourinary:  Dilation: 3.5 Effacement (%): 70 Cervical Position: Posterior Station: -2, -3 Presentation: Vertex Exam by:: jolynn  Musculoskeletal: Normal range of motion.  Neurological: She is alert and oriented to person, place, and time.  Skin: Skin is warm. She is not diaphoretic.  Psychiatric: Her behavior is normal.   Fetal Tracing: Baseline: 155 bpm, areas on the tracing where baseline is difficult  to assess due to variability  Variability: Marked variability  Accelerations: 15x15 Decelerations: Variable decels, ?late decels difficult to assess due to marked variability, BPP ordered.  Toco: Q4-6  BPP 6/8 RN rechecked cervix, patient now 5cm MAU Course  Procedures  None  MDM  IV Bolus  Fern negative per RN.  Discussed fetal tracing with Dr. Gaynell Face; requested MD review of fetal tracing. BPP per Dr. Gaynell Face  Report given to Thressa Sheller CNM  2055: RN will report to Dr. Gaynell Face Assessment and Plan  Labor at term  RN to inform Dr. Gaynell Face of BPP and cervical recheck.   Horald Pollen, NP 07/03/2015 7:35 PM

## 2015-07-04 ENCOUNTER — Encounter: Payer: Self-pay | Admitting: Obstetrics

## 2015-07-04 ENCOUNTER — Encounter (HOSPITAL_COMMUNITY): Payer: Self-pay | Admitting: *Deleted

## 2015-07-04 LAB — RPR: RPR Ser Ql: NONREACTIVE

## 2015-07-04 LAB — CBC
HCT: 29 % — ABNORMAL LOW (ref 36.0–46.0)
Hemoglobin: 9.3 g/dL — ABNORMAL LOW (ref 12.0–15.0)
MCH: 31.1 pg (ref 26.0–34.0)
MCHC: 32.1 g/dL (ref 30.0–36.0)
MCV: 97 fL (ref 78.0–100.0)
Platelets: 203 10*3/uL (ref 150–400)
RBC: 2.99 MIL/uL — ABNORMAL LOW (ref 3.87–5.11)
RDW: 13.7 % (ref 11.5–15.5)
WBC: 9.6 10*3/uL (ref 4.0–10.5)

## 2015-07-04 MED ORDER — PRENATAL MULTIVITAMIN CH
1.0000 | ORAL_TABLET | Freq: Every day | ORAL | Status: DC
  Filled 2015-07-04 (×2): qty 1

## 2015-07-04 MED ORDER — ACETAMINOPHEN 325 MG PO TABS: 650.0000 mg | ORAL_TABLET | ORAL | Status: DC | PRN

## 2015-07-04 MED ORDER — LANOLIN HYDROUS EX OINT: TOPICAL_OINTMENT | CUTANEOUS | Status: DC | PRN

## 2015-07-04 MED ORDER — SIMETHICONE 80 MG PO CHEW: 80.0000 mg | CHEWABLE_TABLET | ORAL | Status: DC | PRN

## 2015-07-04 MED ORDER — ONDANSETRON HCL 4 MG PO TABS: 4.0000 mg | ORAL_TABLET | ORAL | Status: DC | PRN

## 2015-07-04 MED ORDER — SENNOSIDES-DOCUSATE SODIUM 8.6-50 MG PO TABS: 2.0000 | ORAL_TABLET | ORAL | Status: DC

## 2015-07-04 MED ORDER — BENZOCAINE-MENTHOL 20-0.5 % EX AERO: 1.0000 "application " | INHALATION_SPRAY | CUTANEOUS | Status: DC | PRN

## 2015-07-04 MED ORDER — ONDANSETRON HCL 4 MG/2ML IJ SOLN: 4.0000 mg | INTRAMUSCULAR | Status: DC | PRN

## 2015-07-04 MED ORDER — FERROUS SULFATE 325 (65 FE) MG PO TABS
325.0000 mg | ORAL_TABLET | Freq: Two times a day (BID) | ORAL | Status: DC
  Administered 2015-07-04 – 2015-07-05 (×3): 325 mg via ORAL
  Filled 2015-07-04 (×3): qty 1

## 2015-07-04 MED ORDER — IBUPROFEN 600 MG PO TABS
600.0000 mg | ORAL_TABLET | Freq: Four times a day (QID) | ORAL | Status: DC
  Administered 2015-07-04 – 2015-07-05 (×5): 600 mg via ORAL
  Filled 2015-07-04 (×5): qty 1

## 2015-07-04 MED ORDER — WITCH HAZEL-GLYCERIN EX PADS: 1.0000 "application " | MEDICATED_PAD | CUTANEOUS | Status: DC | PRN

## 2015-07-04 MED ORDER — DIBUCAINE 1 % RE OINT: 1.0000 "application " | TOPICAL_OINTMENT | RECTAL | Status: DC | PRN

## 2015-07-04 MED ORDER — TETANUS-DIPHTH-ACELL PERTUSSIS 5-2.5-18.5 LF-MCG/0.5 IM SUSP: 0.5000 mL | Freq: Once | INTRAMUSCULAR | Status: DC

## 2015-07-04 MED ORDER — ZOLPIDEM TARTRATE 5 MG PO TABS: 5.0000 mg | ORAL_TABLET | Freq: Every evening | ORAL | Status: DC | PRN

## 2015-07-04 MED ORDER — DIPHENHYDRAMINE HCL 25 MG PO CAPS: 25.0000 mg | ORAL_CAPSULE | Freq: Four times a day (QID) | ORAL | Status: DC | PRN

## 2015-07-04 NOTE — Progress Notes (Signed)
UR chart review completed.  

## 2015-07-04 NOTE — H&P (Signed)
This is Dr. Francoise Ceo dictating the history and physical on  Courtney Adams  she's a 30 year old gravida 5 para 0131 EDC 07/14/2015 negative GBS 38 and 4 she had been admitted in labor 4 cm rapid progress had a normal vaginal delivery Apgar 8 and 9 placenta spontaneous no episiotomy or laceration Past medical history negative Past surgical history negative Social history negative System review negative Physical exam well-developed female post delivery HEENT negative Lungs clear to P&A Heart regular rhythm no murmurs no gallops Breasts negative Abdomen uterus 20 week size postpartum Pelvic as described above Extremities negative

## 2015-07-04 NOTE — Progress Notes (Signed)
Subjective:    Courtney Adams is a 30 y.o. female being seen today for her obstetrical visit. She is at [redacted]w[redacted]d gestation. Patient reports no complaints. Fetal movement: normal.  Problem List Items Addressed This Visit    Encounter for supervision of other normal pregnancy - Primary   Relevant Orders   SureSwab Bacterial Vaginosis/itis   POCT urinalysis dipstick (Completed)     Patient Active Problem List   Diagnosis Date Noted  . NVD (normal vaginal delivery) 07/04/2015  . Active labor 07/03/2015  . Headache in pregnancy 01/03/2015  . Encounter for supervision of other normal pregnancy 01/03/2015  . Yeast vaginitis 07/11/2014  . Dry skin 07/11/2014  . Contact dermatitis 01/22/2014  . Occipital lymphadenopathy 01/22/2014  . Hypotension, unspecified 01/22/2014  . Vaginal discharge 12/19/2013  . High risk sexual behavior 10/19/2013  . Hand cramps 10/08/2013  . Bacterial vaginosis 06/06/2013  . Pre-syncope 05/31/2013  . Back pain of thoracolumbar region 05/04/2013  . Diarrhea 05/04/2013  . Furunculus 03/28/2013  . Mood disorder (HCC) 06/19/2012  . Substance abuse 06/19/2012  . Contraception management 06/19/2012  . Health care maintenance 06/19/2012    Objective:    BP 121/81 mmHg  Pulse 117  Temp(Src) 98.6 F (37 C)  Wt 155 lb (70.308 kg)  LMP 10/07/2014 (Approximate) FHT: 150 BPM  Uterine Size: size equals dates  Presentations: cephalic    Assessment:    Pregnancy @ [redacted]w[redacted]d weeks   Plan:   Plans for delivery: Vaginal anticipated; labs reviewed; problem list updated Counseling: Consent signed. Infant feeding: plans to breastfeed. Cigarette smoking: former smoker. L&D discussion: symptoms of labor, discussed when to call, discussed what number to call, anesthetic/analgesic options reviewed and delivering clinician:  plans no preference. Postpartum supports and preparation: circumcision discussed and contraception plans discussed.  Follow up in 1 Week.

## 2015-07-04 NOTE — Anesthesia Postprocedure Evaluation (Signed)
Anesthesia Post Note  Patient: Courtney Adams  Procedure(s) Performed: * No procedures listed *  Patient location during evaluation: Mother Baby Anesthesia Type: Epidural Level of consciousness: awake and alert and oriented Pain management: satisfactory to patient Vital Signs Assessment: post-procedure vital signs reviewed and stable Respiratory status: spontaneous breathing and nonlabored ventilation Cardiovascular status: stable Postop Assessment: no headache, no backache, no signs of nausea or vomiting, adequate PO intake and patient able to bend at knees (patient up walking) Anesthetic complications: no    Last Vitals:  Filed Vitals:   07/04/15 0340 07/04/15 0730  BP: 123/61 107/64  Pulse: 82 80  Temp: 36.9 C   Resp: 18 20    Last Pain:  Filed Vitals:   07/04/15 0807  PainSc: 3                  Cordelia Bessinger

## 2015-07-05 MED ORDER — IBUPROFEN 600 MG PO TABS: 600.0000 mg | ORAL_TABLET | Freq: Four times a day (QID) | ORAL | Status: DC | PRN

## 2015-07-05 MED ORDER — OXYCODONE-ACETAMINOPHEN 5-325 MG PO TABS: 1.0000 | ORAL_TABLET | ORAL | Status: DC | PRN

## 2015-07-05 NOTE — Discharge Summary (Signed)
Obstetric Discharge Summary Reason for Admission: onset of labor Prenatal Procedures: NST and ultrasound Intrapartum Procedures: spontaneous vaginal delivery Postpartum Procedures: none Complications-Operative and Postpartum: none HEMOGLOBIN  Date Value Ref Range Status  07/04/2015 9.3* 12.0 - 15.0 g/dL Final   HCT  Date Value Ref Range Status  07/04/2015 29.0* 36.0 - 46.0 % Final    Physical Exam:  General: alert and no distress Lochia: appropriate Uterine Fundus: firm Incision: none DVT Evaluation: No evidence of DVT seen on physical exam.  Discharge Diagnoses: Term Pregnancy-delivered  Discharge Information: Date: 07/05/2015 Activity: pelvic rest Diet: routine Medications: PNV, Ibuprofen, Colace, Iron and Percocet Condition: stable Instructions: refer to practice specific booklet Discharge to: home Follow-up Information    Follow up with HARPER,CHARLES A, MD. Schedule an appointment as soon as possible for a visit in 2 weeks.   Specialty:  Obstetrics and Gynecology   Contact information:   [address] [place] [address] [phone]       Newborn Data: Live born female  Birth Weight: 6 lb 13.2 oz (3095 g) APGAR: 9, 9  Home with mother.  HARPER,CHARLES A 07/05/2015, 8:23 AM

## 2015-07-05 NOTE — Progress Notes (Signed)
Post Partum Day 1 Subjective: no complaints  Objective: Blood pressure 112/79, pulse 91, temperature 98.4 F (36.9 C), temperature source Oral, resp. rate 18, height  (1.702 m), weight 155 lb (70.308 kg), last menstrual period 10/07/2014, SpO2 100 %, unknown if currently breastfeeding.  Physical Exam:  General: alert and no distress Lochia: appropriate Uterine Fundus: firm Incision: none DVT Evaluation: No evidence of DVT seen on physical exam.   Recent Labs  07/03/15 1910 07/04/15 0550  HGB 10.7* 9.3*  HCT 33.1* 29.0*    Assessment/Plan: Mild anemia.  Stable.  Iron Rx. Plan for discharge tomorrow   LOS: 2 days   Jerric Oyen A 07/05/2015, 6:50 AM

## 2015-07-07 LAB — SURESWAB BACTERIAL VAGINOSIS/ITIS
Atopobium vaginae: NOT DETECTED Log (cells/mL)
C. albicans, DNA: NOT DETECTED
C. glabrata, DNA: NOT DETECTED
C. parapsilosis, DNA: NOT DETECTED
C. tropicalis, DNA: NOT DETECTED
Gardnerella vaginalis: NOT DETECTED Log (cells/mL)
LACTOBACILLUS SPECIES: 7.5 Log (cells/mL)
MEGASPHAERA SPECIES: NOT DETECTED Log (cells/mL)
T. vaginalis RNA, QL TMA: NOT DETECTED

## 2015-07-08 ENCOUNTER — Telehealth: Payer: Self-pay | Admitting: *Deleted

## 2015-07-08 ENCOUNTER — Encounter: Payer: Self-pay | Admitting: *Deleted

## 2015-07-08 NOTE — Telephone Encounter (Signed)
Patient states she delivered on Friday. She missed her class on Monday and she needs a note to take to class tomorrow. Please call her. She also has a question about about her milk. Per Dr Clearance Coots- Patient may attend her class. Advisement given for discontinuing milk production. Will check to see if STD check can be added to wet prep testing  Done on 1/19.

## 2015-07-08 NOTE — Telephone Encounter (Signed)
Ptient appt made.

## 2015-07-10 ENCOUNTER — Ambulatory Visit (INDEPENDENT_AMBULATORY_CARE_PROVIDER_SITE_OTHER): Payer: Medicaid Other | Admitting: Family Medicine

## 2015-07-10 ENCOUNTER — Encounter: Payer: Self-pay | Admitting: Family Medicine

## 2015-07-10 VITALS — BP 120/80 | HR 66 | Temp 98.5°F | Ht 67.0 in | Wt 143.0 lb

## 2015-07-10 DIAGNOSIS — R590 Localized enlarged lymph nodes: Secondary | ICD-10-CM | POA: Diagnosis present

## 2015-07-10 NOTE — Progress Notes (Signed)
Subjective:    Patient ID: SUNSET JOSHI, female    DOB: Aug 24, 1985, 30 y.o.   MRN: [id number]  Courtney Adams is a 30 y.o. female presenting on 07/10/2015 for lump under right arm  HPI   LYMPHADENOPATHY, RIGHT ARM AXILLA: - Patient reports recent history of pregnancy with delivery within past 1 week. Today she reports a "swollen bump" under her Right armpit that seems to have been present for the past 1-2 months. States it is gradually increasing in size. Some discomfort or sore if press on it otherwise no pain. She was not aware of any other locations of swelling or bumps including other armpit. - Past history on chart review of occipital lymphadenopathy 01/2014 secondary to scalp irritation that since resolved. - History of prior abscess or boil on her bottom, otherwise no problems with boils in armpits - Additionally states she is not breastfeeding and trying to "dry up her milk", but denies any breast masses or skin changes - No known family history of breast cancer (not in mother or sister, unsure of rest of family) - Denies any fevers/chills, redness or rash, drainage, bleeding   Social History   Social History  . Marital Status: Single    Spouse Name: N/A  . Number of Children: N/A  . Years of Education: N/A   Occupational History  . Not on file.   Social History Main Topics  . Smoking status: Former Smoker    Quit date: 11/13/2014  . Smokeless tobacco: Never Used  . Alcohol Use: No  . Drug Use: No     Comment: last was [id number]  . Sexual Activity: Yes   Other Topics Concern  . Not on file   Social History Narrative    Review of Systems Per HPI unless specifically indicated above     Objective:    BP 120/80 mmHg  Pulse 66  Temp(Src) 98.5 F (36.9 C) (Oral)  Ht  (1.702 m)  Wt 143 lb (64.864 kg)  BMI 22.39 kg/m2  SpO2 99%  LMP 10/07/2014 (Approximate)  Wt Readings from Last 3 Encounters:  07/10/15 143 lb (64.864 kg)  07/03/15 155 lb (70.308 kg)    07/03/15 155 lb (70.308 kg)    Physical Exam  Constitutional: She appears well-developed and well-nourished. No distress.  Skin: Skin is warm and dry. No rash noted. She is not diaphoretic. No erythema.  Right Axilla with mid to anterior deep palpable about 1.5 x 1.5 cm mass that is firm soft, mobile, mildly tender to palpation. Consistent mostly with a lymph node. No fluctuance or surrounding induration. No drainage, rash, or erythema.  Left Axilla smaller about 1 x 1 cm or less firm soft mobile mass in similar location, non-tender, no rash, no erythema, non-tender.  Nursing note and vitals reviewed.      Assessment & Plan:   Problem List Items Addressed This Visit    Axillary lymphadenopathy - Primary    Unclear etiology for mildly tender soft mobile firm 1.5 to 1 cm masses bilateral axilla R>L, possibly >1 month duration (end of pregnancy now deliver 1 week ago with worsening). She is not breastfeeding but trying to dry up milk supply. No systemic symptoms, no evidence of cellulitis or draining abscess, no mastitis. Differential includes concern for lymphadenopathy (less likely breast malignancy with timing during pregnancy and bilateral, but cannot rule out, alternatively could be reactive with mild tenderness), consider deeper abscess with hidradenitis, also a benign cause that would be dx  of exclusion is clogged milk ducts even in outer breast tissue towards axilla (as she is not breastfeeding or pumping).  Plan: 1. Close monitoring and observation, look for signs of infection or breast changes, systemic symptoms, any change in size, if growing or more pain, redness, need to return sooner, otherwise re-check within next 2 weeks. Consider future ultrasound if persistent or worsening         No orders of the defined types were placed in this encounter.      Follow up plan: Return in about 2 weeks (around 07/24/2015) for lymphadenopathy.  Saralyn Pilar, DO [facility]  Family Medicine, PGY-3

## 2015-07-10 NOTE — Patient Instructions (Signed)
Thank you for coming in to clinic today.  1. The bump under arm seems consistent with a swollen lymph node, there is a small similar spot under your left arm as well. These changes could be related to your pregnancy. You don't have any other risk factors at this time as discussed.  If still present we would consider proceeding with imaging, and future maybe biopsy.  Please schedule a follow-up appointment with Dr Althea Charon in 2 to 4 weeks to re-check Swollen Lymph Nodes (axillary) after milk production slows down.  If you have any other questions or concerns, please feel free to call the clinic to contact me. You may also schedule an earlier appointment if necessary.  However, if your symptoms get significantly worse, please go to the Emergency Department to seek immediate medical attention.  Saralyn Pilar, DO [facility] Family [facility]

## 2015-07-11 LAB — C. TRACHOMATIS/N. GONORRHOEAE RNA
C. trachomatis RNA, TMA: NOT DETECTED
N. gonorrhoeae RNA, TMA: NOT DETECTED

## 2015-07-11 NOTE — Assessment & Plan Note (Addendum)
Unclear etiology for mildly tender soft mobile firm 1.5 to 1 cm masses bilateral axilla R>L, possibly >1 month duration (end of pregnancy now deliver 1 week ago with worsening). She is not breastfeeding but trying to dry up milk supply. No systemic symptoms, no evidence of cellulitis or draining abscess, no mastitis. Differential includes concern for lymphadenopathy (less likely breast malignancy with timing during pregnancy and bilateral, but cannot rule out, alternatively could be reactive with mild tenderness), consider deeper abscess with hidradenitis, also a benign cause that would be dx of exclusion is clogged milk ducts even in outer breast tissue towards axilla (as she is not breastfeeding or pumping).  Plan: 1. Close monitoring and observation, look for signs of infection or breast changes, systemic symptoms, any change in size, if growing or more pain, redness, need to return sooner, otherwise re-check within next 2 weeks. Consider future ultrasound if persistent or worsening

## 2015-07-17 ENCOUNTER — Ambulatory Visit: Payer: Medicaid Other | Admitting: Obstetrics

## 2015-08-05 ENCOUNTER — Ambulatory Visit: Payer: Medicaid Other | Admitting: Family Medicine

## 2015-08-05 ENCOUNTER — Other Ambulatory Visit (HOSPITAL_COMMUNITY)
Admission: RE | Admit: 2015-08-05 | Discharge: 2015-08-05 | Disposition: A | Discharge status: Home / Self Care | Payer: Medicaid Other | Source: Ambulatory Visit | Attending: Family Medicine | Admitting: Family Medicine

## 2015-08-05 ENCOUNTER — Ambulatory Visit (INDEPENDENT_AMBULATORY_CARE_PROVIDER_SITE_OTHER): Payer: Medicaid Other | Admitting: Family Medicine

## 2015-08-05 ENCOUNTER — Encounter: Payer: Self-pay | Admitting: Family Medicine

## 2015-08-05 ENCOUNTER — Other Ambulatory Visit: Payer: Self-pay | Admitting: Certified Nurse Midwife

## 2015-08-05 VITALS — BP 113/82 | HR 86 | Temp 98.0°F | Wt 127.0 lb

## 2015-08-05 DIAGNOSIS — N898 Other specified noninflammatory disorders of vagina: Secondary | ICD-10-CM

## 2015-08-05 DIAGNOSIS — Z113 Encounter for screening for infections with a predominantly sexual mode of transmission: Secondary | ICD-10-CM | POA: Insufficient documentation

## 2015-08-05 DIAGNOSIS — R590 Localized enlarged lymph nodes: Secondary | ICD-10-CM

## 2015-08-05 LAB — POCT WET PREP (WET MOUNT): Clue Cells Wet Prep Whiff POC: NEGATIVE

## 2015-08-05 NOTE — Assessment & Plan Note (Signed)
Resolved, less likely LAD (afebrile and no signs of infection), normal exam today, suspect most likely related to milk production and clogged milk ducts  Not breastfeeding, now >4 weeks postpartum

## 2015-08-05 NOTE — Patient Instructions (Signed)
1. Your symptoms sound consistent with a Vaginitis - irritation from likely BV or yeast. 2. Tested wet prep today looking for BV, Yeast, and Trichomonas - will call you with results. - If needed will send in Metrogel twice daily for 5 days, if not working may need to do Metronidazole pills - If positive for Yeast - will send Diflucan  pill, take 1 and then on Day 3 take 2nd pill. - If both medicines sent, finish Metrogel FIRST then take Diflucan. 3. Will call with results for Gonorrhea / Chlamydia swab in a few days  If BV - This problem can be recurrent. Some people are more likely to get it than others, and it has to do with normal body chemistry and vaginal environment. One of the biggest risk factors for causing BV is unprotected sexual intercourse (without a condom), because semen can change the chemistry of your vaginal environment, causing the "good bacteria" reduce and the other bacteria to grow and cause your symptoms. Recommend to start using condoms with intercourse to see if this helps reduce your symptoms, do this especially for next 1 week while on treatment. Some other recommendations include taking a daily probiotic and yogurt can help reduce BV as well, this is not proven to work in all patients.  If tests negative, maybe normal symptoms following period. Unprotected intercourse can cause irritation and no infection sometimes. Make sure to try using condoms to see if reduces your symptoms. Probiotics and yogurt can help reduce BV as well.  Recommend follow-up to discuss Mirena IUD insertion for birth control, will help reduce bleeding.

## 2015-08-05 NOTE — Progress Notes (Signed)
   Subjective:    Patient ID: Courtney Adams, female    DOB: 1985/06/29, 30 y.o.   MRN: [id number]  Courtney Adams is a 30 y.o. female presenting on 08/05/2015 for Vaginal Discharge  Patient presents for a same day appointment.  HPI   Reports symptoms for 1 week with vaginal discharge, yellowish color without odor. She is 4 weeks postpartum and admits to having sexual intercourse, unprotected with same partner, no known STDs. Pregnancy was followed by Dr Clearance Coots. Has not considered future contraception options but will look into IUD. - History of BV previously. History of positive chlamydia 12/2014.  Additionally today reports her swollen "bumps" in both armpits are gone, from last visit. No longer producing significant amount breast milk.  Social History  Substance Use Topics  . Smoking status: Former Smoker    Quit date: 11/13/2014  . Smokeless tobacco: Never Used  . Alcohol Use: No    Review of Systems Per HPI unless specifically indicated above  Denies any fevers, chills, rash, vaginal bleeding, abdominal or pelvic pain     Objective:    BP 113/82 mmHg  Pulse 86  Temp(Src) 98 F (36.7 C) (Oral)  Wt 127 lb (57.607 kg)  Wt Readings from Last 3 Encounters:  08/05/15 127 lb (57.607 kg)  07/10/15 143 lb (64.864 kg)  07/03/15 155 lb (70.308 kg)    Physical Exam  Constitutional: She appears well-developed and well-nourished. No distress.  Well-appearing, comfortable  Genitourinary: Vaginal discharge found.  Normal external female genitalia. Vaginal canal without lesions. Cervix appears normal in postpartum setting. Increased thin white-yellow discharge on exam. Bimanual exam without masses or cervical motion tenderness.  Musculoskeletal: She exhibits no edema.  Lymphadenopathy:    She has no axillary adenopathy (Completely resolved bilateral LAD (previously palpable round masses bilaterally)).  Neurological: She is alert.  Skin: Skin is warm and dry. No rash noted. She is  not diaphoretic.  Nursing note and vitals reviewed.  Pelvic exam chaperoned by Garen Grams, LPN     Assessment & Plan:   Problem List Items Addressed This Visit    RESOLVED: Axillary lymphadenopathy    Resolved, less likely LAD (afebrile and no signs of infection), normal exam today, suspect most likely related to milk production and clogged milk ducts  Not breastfeeding, now >4 weeks postpartum      Vaginal discharge - Primary    Unclear vaginal discharge, may be related to postpartum period vs concern potential chlamydia (prior history) followed sexual intercourse. - Wet prep (negative for clue cells, trich, or yeast), negative whiff  Plan: 1. Check GC/Chlamydia - follow-up results 2. Follow-up if worsening, return criteria given      Relevant Orders   Cervicovaginal ancillary only   POCT Wet Prep [facility]) (Completed)      No orders of the defined types were placed in this encounter.    Follow up plan: Return in about 2 weeks (around 08/19/2015) for contraception discuss. IUD preferable   Saralyn Pilar, DO [facility] Family Medicine, PGY-3

## 2015-08-06 LAB — CERVICOVAGINAL ANCILLARY ONLY
Chlamydia: NEGATIVE
Neisseria Gonorrhea: NEGATIVE

## 2015-08-06 NOTE — Assessment & Plan Note (Signed)
Unclear vaginal discharge, may be related to postpartum period vs concern potential chlamydia (prior history) followed sexual intercourse. - Wet prep (negative for clue cells, trich, or yeast), negative whiff  Plan: 1. Check GC/Chlamydia - follow-up results 2. Follow-up if worsening, return criteria given

## 2015-08-11 ENCOUNTER — Telehealth: Payer: Self-pay | Admitting: Family Medicine

## 2015-08-11 DIAGNOSIS — N760 Acute vaginitis: Secondary | ICD-10-CM

## 2015-08-11 DIAGNOSIS — B9689 Other specified bacterial agents as the cause of diseases classified elsewhere: Secondary | ICD-10-CM

## 2015-08-11 MED ORDER — METRONIDAZOLE 0.75 % VA GEL
1.0000 | Freq: Two times a day (BID) | VAGINAL | Status: DC
Start: 2015-08-11 — End: 2016-03-11

## 2015-08-11 NOTE — Telephone Encounter (Signed)
Would like test results. °

## 2015-08-11 NOTE — Telephone Encounter (Signed)
Called patient back, reviewed all test results from appointment last week with vaginal discharge. Wet prep negative BV, yeast, or trichomonas, many WBC seen. GC/Chlamydia testing negative. Discussed with patient, and she reports still having some vaginal discharge, but currently hard to tell with current menstrual period. I advised her that metrogel 0.75% intravaginal gel sent to her pharmacy BID x 5 days if her discharge symptoms continue, then if not resolved after this treatment we would consider repeat pelvic exam or further discussion.  Saralyn Pilar, DO [facility] Family Medicine, PGY-3

## 2015-08-12 ENCOUNTER — Ambulatory Visit: Payer: Medicaid Other | Admitting: Obstetrics

## 2015-08-14 ENCOUNTER — Ambulatory Visit (INDEPENDENT_AMBULATORY_CARE_PROVIDER_SITE_OTHER): Payer: Medicaid Other | Admitting: Obstetrics

## 2015-08-14 ENCOUNTER — Encounter: Payer: Self-pay | Admitting: Obstetrics

## 2015-08-14 DIAGNOSIS — D509 Iron deficiency anemia, unspecified: Secondary | ICD-10-CM

## 2015-08-14 DIAGNOSIS — Z3009 Encounter for other general counseling and advice on contraception: Secondary | ICD-10-CM

## 2015-08-14 MED ORDER — NORETHINDRONE-ETH ESTRADIOL 1-35 MG-MCG PO TABS: 1.0000 | ORAL_TABLET | Freq: Every day | ORAL | Status: DC

## 2015-08-14 MED ORDER — VITAFOL FE+ 90-1-200 & 50 MG PO CPPK: 1.0000 | ORAL_CAPSULE | Freq: Every day | ORAL | Status: DC

## 2015-08-14 NOTE — Progress Notes (Signed)
Subjective:     Courtney Adams is a 30 y.o. female who presents for a postpartum visit. She is 6 weeks postpartum following a spontaneous vaginal delivery. I have fully reviewed the prenatal and intrapartum course. The delivery was at 38.4 gestational weeks. Outcome: spontaneous vaginal delivery. Anesthesia: epidural. Postpartum course has been normal. Baby's course has been normal. Baby is feeding by bottle - Similac Advance. Bleeding no bleeding. Bowel function is normal. Bladder function is normal. Patient is sexually active. Contraception method is none. Postpartum depression screening: negative.  Tobacco, alcohol and substance abuse history reviewed.  Adult immunizations reviewed including TDAP, rubella and varicella.  The following portions of the patient's history were reviewed and updated as appropriate: allergies, current medications, past family history, past medical history, past social history, past surgical history and problem list.  Review of Systems A comprehensive review of systems was negative.   Objective:    BP 100/74 mmHg  Pulse 70  Temp(Src) 98.6 F (37 C)  Wt 129 lb (58.514 kg)  Breastfeeding? No  General:  alert and no distress   Breasts:  inspection negative, no nipple discharge or bleeding, no masses or nodularity palpable  Lungs: clear to auscultation bilaterally  Heart:  regular rate and rhythm, S1, S2 normal, no murmur, click, rub or gallop  Abdomen: soft, non-tender; bowel sounds normal; no masses,  no organomegaly   Vulva:  normal  Vagina: normal vagina  Cervix:  no cervical motion tenderness  Corpus: normal size, contour, position, consistency, mobility, non-tender  Adnexa:  no mass, fullness, tenderness  Rectal Exam: Not performed.           Assessment:     Normal postpartum exam. Pap smear not done at today's visit.    Plan:    1. Contraception: OCP (estrogen/progesterone) 2. Ortho Novum 1/35 Rx 3. Follow up in: 6 months or as needed.    Healthy lifestyle practices reviewed

## 2015-11-06 ENCOUNTER — Emergency Department (HOSPITAL_COMMUNITY)
Admission: EM | Admit: 2015-11-06 | Discharge: 2015-11-06 | Disposition: A | Discharge status: Home / Self Care | Payer: Medicaid Other | Attending: Emergency Medicine | Admitting: Emergency Medicine

## 2015-11-06 ENCOUNTER — Encounter (HOSPITAL_COMMUNITY): Payer: Self-pay | Admitting: *Deleted

## 2015-11-06 ENCOUNTER — Emergency Department (HOSPITAL_COMMUNITY): Payer: Medicaid Other

## 2015-11-06 DIAGNOSIS — R0789 Other chest pain: Secondary | ICD-10-CM | POA: Diagnosis present

## 2015-11-06 DIAGNOSIS — F17210 Nicotine dependence, cigarettes, uncomplicated: Secondary | ICD-10-CM | POA: Diagnosis not present

## 2015-11-06 DIAGNOSIS — R0602 Shortness of breath: Secondary | ICD-10-CM | POA: Diagnosis not present

## 2015-11-06 LAB — CBC
HCT: 38.8 % (ref 36.0–46.0)
Hemoglobin: 12.5 g/dL (ref 12.0–15.0)
MCH: 30.6 pg (ref 26.0–34.0)
MCHC: 32.2 g/dL (ref 30.0–36.0)
MCV: 94.9 fL (ref 78.0–100.0)
Platelets: 231 10*3/uL (ref 150–400)
RBC: 4.09 MIL/uL (ref 3.87–5.11)
RDW: 13 % (ref 11.5–15.5)
WBC: 2.9 10*3/uL — ABNORMAL LOW (ref 4.0–10.5)

## 2015-11-06 LAB — BASIC METABOLIC PANEL
Anion gap: 6 (ref 5–15)
BUN: 7 mg/dL (ref 6–20)
CO2: 26 mmol/L (ref 22–32)
Calcium: 9.2 mg/dL (ref 8.9–10.3)
Chloride: 106 mmol/L (ref 101–111)
Creatinine, Ser: 0.68 mg/dL (ref 0.44–1.00)
GFR calc Af Amer: >60 mL/min (ref >60)
GFR calc non Af Amer: >60 mL/min (ref >60)
Glucose, Bld: 95 mg/dL (ref 65–99)
Potassium: 3.2 mmol/L — ABNORMAL LOW (ref 3.5–5.1)
Sodium: 138 mmol/L (ref 135–145)

## 2015-11-06 LAB — I-STAT TROPONIN, ED: Troponin i, poc: 0 ng/mL (ref 0.00–0.08)

## 2015-11-06 MED ORDER — IBUPROFEN 400 MG PO TABS
400.0000 mg | ORAL_TABLET | Freq: Once | ORAL | Status: AC
  Administered 2015-11-06: 400 mg via ORAL
  Filled 2015-11-06: qty 1

## 2015-11-06 MED ORDER — NICOTINE 21 MG/24HR TD PT24: 21.0000 mg | MEDICATED_PATCH | Freq: Every day | TRANSDERMAL | Status: DC

## 2015-11-06 NOTE — ED Notes (Signed)
Pt c/o CP under the L armpit onset x 1 wk with SOB, denies n/v/d, pt reports hx of anxiety & states, "I thought it was anxiety but I wanted to be safe." A&O x4

## 2015-11-06 NOTE — ED Notes (Signed)
MD at bedside. 

## 2015-11-06 NOTE — ED Provider Notes (Addendum)
CSN: [id number]     Arrival date & time 11/06/15  0803 History   First MD Initiated Contact with Patient 11/06/15 [phone]     Chief Complaint  Patient presents with  . Chest Pain     (Consider location/radiation/quality/duration/timing/severity/associated sxs/prior Treatment) Patient is a 30 y.o. female presenting with chest pain. The history is provided by the patient.  Chest Pain Pain location:  L chest Pain quality: aching and tightness   Pain radiates to:  Does not radiate Pain radiates to the back: no   Pain severity:  Moderate Onset quality:  Gradual Duration:  1 week Timing:  Constant Progression:  Worsening Chronicity:  Recurrent Context: not eating, no movement and not raising an arm   Context comment:  Patient states he only thing she can recall that changes her symptoms is when she smokes it seems worse Relieved by:  None tried Exacerbated by: smoking. Ineffective treatments:  None tried Associated symptoms: shortness of breath   Associated symptoms: no abdominal pain, no cough, no diaphoresis, no fever, no lower extremity edema, no nausea, no palpitations, no syncope, not vomiting and no weakness   Risk factors: smoking   Risk factors: no birth control, no coronary artery disease, no diabetes mellitus, no hypertension, no immobilization, not obese, not pregnant and no prior DVT/PE   Risk factors comment:  No family hx of dvt/pe   Past Medical History  Diagnosis Date  . BV (bacterial vaginosis)   . Anxiety   . Preterm delivery   . Chlamydia   . Gonorrhea   . Trichomonas   . Bipolar 1 disorder (HCC)   . Anxiety   . Anemia   . Abortion   . Preterm labor   . Infection     UTI   Past Surgical History  Procedure Laterality Date  . Dilation and curettage of uterus     Family History  Problem Relation Age of Onset  . Anemia Mother   . Cervical cancer Sister   . Diabetes Maternal Grandmother   . Heart disease Paternal Grandmother   . Heart disease Paternal  Grandfather   . Stroke Paternal Uncle    Social History  Substance Use Topics  . Smoking status: Current Every Day Smoker -- 0.50 packs/day    Types: Cigarettes    Last Attempt to Quit: 11/13/2014  . Smokeless tobacco: Never Used  . Alcohol Use: No   OB History    Gravida Para Term Preterm AB TAB SAB Ectopic Multiple Living   0 1      Obstetric Comments   - Reportedly had PTL, advised bedrest at 6 months, advised no sexual activity but continued and thinks this may have contributed to pre-term delivery. Delivery [facility]VD at [facility] hospital - NICU for 16 days     Review of Systems  Constitutional: Negative for fever and diaphoresis.  Respiratory: Positive for shortness of breath. Negative for cough.   Cardiovascular: Positive for chest pain. Negative for palpitations and syncope.  Gastrointestinal: Negative for nausea, vomiting and abdominal pain.  Neurological: Negative for weakness.  All other systems reviewed and are negative.     Allergies  Review of patient's allergies indicates no known allergies.  Home Medications   Prior to Admission medications   Medication Sig Start Date End Date Taking? Authorizing Provider  metroNIDAZOLE (METROGEL VAGINAL) 0.75 % vaginal gel Place 1 Applicatorful vaginally 2 (two) times daily. Use for 5 days. Patient not taking: Reported  on 08/14/2015 08/11/15   Smitty CordsAlexander J Karamalegos, DO  norethindrone-ethinyl estradiol 1/35 (ORTHO-NOVUM 1/35, 28,) tablet Take 1 tablet by mouth daily. 08/14/15   Brock Badharles A Harper, MD  Prenat-FePoly-Metf-FA-DHA-DSS (VITAFOL FE+) 90-1-200 & 50 MG CPPK Take 1 tablet by mouth daily before breakfast. 08/14/15   Brock Badharles A Harper, MD   BP 113/79 mmHg  Pulse 78  Temp(Src) 97.8 F (36.6 C) (Oral)  Resp 17  SpO2 100% Physical Exam  Constitutional: She is oriented to person, place, and time. She appears well-developed and well-nourished. No distress.  HENT:  Head: Normocephalic and atraumatic.  Mouth/Throat:  Oropharynx is clear and moist.  Eyes: Conjunctivae and EOM are normal. Pupils are equal, round, and reactive to light.  Neck: Normal range of motion. Neck supple.  Cardiovascular: Normal rate, regular rhythm and intact distal pulses.   No murmur heard. Pulmonary/Chest: Effort normal and breath sounds normal. No respiratory distress. She has no wheezes. She has no rales. She exhibits tenderness.    Abdominal: Soft. She exhibits no distension. There is no tenderness. There is no rebound and no guarding.  Musculoskeletal: Normal range of motion. She exhibits no edema or tenderness.  Neurological: She is alert and oriented to person, place, and time.  Skin: Skin is warm and dry. No rash noted. No erythema.  Psychiatric: She has a normal mood and affect. Her behavior is normal.  Nursing note and vitals reviewed.   ED Course  Procedures (including critical care time) Labs Review Labs Reviewed  BASIC METABOLIC PANEL - Abnormal; Notable for the following:    Potassium 3.2 (*)    All other components within normal limits  CBC - Abnormal; Notable for the following:    WBC 2.9 (*)    All other components within normal limits  I-STAT TROPOININ, ED    Imaging Review Dg Chest 2 View  11/06/2015  CLINICAL DATA:  Mid chest pain for week on LEFT side under breast, shortness of breath today, smoker EXAM: CHEST  2 VIEW COMPARISON:  04/03/2014 FINDINGS: Normal heart size, mediastinal contours, and pulmonary vascularity. Lungs minimally hyperaerated but clear. No pulmonary infiltrate, pleural effusion or pneumothorax. Osseous structures unremarkable. IMPRESSION: Minimal hyperinflation without acute abnormalities. Electronically Signed   By: Ulyses SouthwardMark  Boles M.D.   On: 11/06/2015 08:37   I have personally reviewed and evaluated these images and lab results as part of my medical decision-making.   EKG Interpretation   Date/Time:  Thursday Nov 06 2015 08:12:16 EDT Ventricular Rate:  67 PR Interval:   132 QRS Duration: 68 QT Interval:  402 QTC Calculation: 424 R Axis:   88 Text Interpretation:  Sinus rhythm Lead(s) I were not used for morphology  analysis Normal ECG No significant change since last tracing Confirmed by  Anitra LauthPLUNKETT  MD, Alphonzo LemmingsWHITNEY ([id number]) on 11/06/2015 8:20:59 AM      MDM   Final diagnoses:  Chest wall pain    Pt with atypical story for CP constant for 1 week and reproducible with palpation.  Heart score 1 for smoking history.  No family history of DVT or PE. Patient does not take OCP.  Low risk Wells criteria.  Patient has no URI symptoms but states the pain is worse when she smokes. Today she did notice some shortness of breath with the pain but denies any rashes, injury or breast tenderness.  EKG wnl.  CXR, CBC, BMP, trop pending.  cxr without acute findings.  Mild leukopenia on CBC but o/w normal labs.  Will treat with nsaids  and give pcp f/u.   Gwyneth Sprout, MD 11/06/15 1610  Gwyneth Sprout, MD 11/06/15 [id number]  Gwyneth Sprout, MD 11/06/15 [phone]

## 2015-11-06 NOTE — ED Notes (Signed)
Patient transported to X-ray 

## 2015-11-06 NOTE — Discharge Instructions (Signed)
Chest Wall Pain °Chest wall pain is pain in or around the bones and muscles of your chest. Sometimes, an injury causes this pain. Sometimes, the cause may not be known. This pain may take several weeks or longer to get better. °HOME CARE °Pay attention to any changes in your symptoms. Take these actions to help with your pain: °· Rest as told by your doctor. °· Avoid activities that cause pain. Try not to use your chest, belly (abdominal), or side muscles to lift heavy things. °· If directed, apply ice to the painful area: °¨ Put ice in a plastic bag. °¨ Place a towel between your skin and the bag. °¨ Leave the ice on for 20 minutes, 2-3 times per day. °· Take over-the-counter and prescription medicines only as told by your doctor. °· Do not use tobacco products, including cigarettes, chewing tobacco, and e-cigarettes. If you need help quitting, ask your doctor. °· Keep all follow-up visits as told by your doctor. This is important. °GET HELP IF: °· You have a fever. °· Your chest pain gets worse. °· You have new symptoms. °GET HELP RIGHT AWAY IF: °· You feel sick to your stomach (nauseous) or you throw up (vomit). °· You feel sweaty or light-headed. °· You have a cough with phlegm (sputum) or you cough up blood. °· You are short of breath. °  °This information is not intended to replace advice given to you by your health care provider. Make sure you discuss any questions you have with your health care provider. °  °Document Released: 11/17/2007 Document Revised: 02/19/2015 Document Reviewed: 08/26/2014 °Elsevier Interactive Patient Education ©2016 [employer] ° °

## 2015-11-16 ENCOUNTER — Other Ambulatory Visit: Payer: Self-pay | Admitting: Family Medicine

## 2015-11-16 DIAGNOSIS — B3731 Acute candidiasis of vulva and vagina: Secondary | ICD-10-CM

## 2015-11-16 DIAGNOSIS — B373 Candidiasis of vulva and vagina: Secondary | ICD-10-CM

## 2015-11-20 ENCOUNTER — Ambulatory Visit (INDEPENDENT_AMBULATORY_CARE_PROVIDER_SITE_OTHER): Payer: Medicaid Other | Admitting: Family Medicine

## 2015-11-20 ENCOUNTER — Encounter: Payer: Self-pay | Admitting: Family Medicine

## 2015-11-20 VITALS — BP 118/77 | HR 100 | Temp 98.5°F | Ht 67.0 in | Wt 109.6 lb

## 2015-11-20 DIAGNOSIS — Z3041 Encounter for surveillance of contraceptive pills: Secondary | ICD-10-CM

## 2015-11-20 DIAGNOSIS — N760 Acute vaginitis: Secondary | ICD-10-CM | POA: Diagnosis not present

## 2015-11-20 DIAGNOSIS — F17200 Nicotine dependence, unspecified, uncomplicated: Secondary | ICD-10-CM | POA: Insufficient documentation

## 2015-11-20 DIAGNOSIS — A499 Bacterial infection, unspecified: Secondary | ICD-10-CM

## 2015-11-20 DIAGNOSIS — N898 Other specified noninflammatory disorders of vagina: Secondary | ICD-10-CM | POA: Diagnosis not present

## 2015-11-20 DIAGNOSIS — Z3200 Encounter for pregnancy test, result unknown: Secondary | ICD-10-CM

## 2015-11-20 DIAGNOSIS — Z720 Tobacco use: Secondary | ICD-10-CM

## 2015-11-20 DIAGNOSIS — B9689 Other specified bacterial agents as the cause of diseases classified elsewhere: Secondary | ICD-10-CM

## 2015-11-20 LAB — POCT WET PREP (WET MOUNT): Clue Cells Wet Prep Whiff POC: POSITIVE

## 2015-11-20 LAB — POCT URINE PREGNANCY: Preg Test, Ur: NEGATIVE

## 2015-11-20 MED ORDER — NICOTINE 7 MG/24HR TD PT24: 7.0000 mg | MEDICATED_PATCH | Freq: Every day | TRANSDERMAL | Status: DC

## 2015-11-20 MED ORDER — METRONIDAZOLE 500 MG PO TABS
500.0000 mg | ORAL_TABLET | Freq: Two times a day (BID) | ORAL | Status: DC
Start: 2015-11-20 — End: 2016-03-11

## 2015-11-20 MED ORDER — PRENATAL/IRON PO TABS: ORAL_TABLET | ORAL | Status: DC

## 2015-11-20 NOTE — Progress Notes (Signed)
Subjective:    Patient ID: Courtney EmerySophia D Eltzroth, female    DOB: 01/09/1986, 30 y.o.   MRN: [id number]  Courtney Adams is a 30 y.o. female presenting on 11/20/2015 for std check and Possible Pregnancy   HPI   VAGINAL DISCHARGE / STD SCREENING - Reports recent history of vaginal irritation with itching and burning, tried Diflucan with resolution, then switched to Metrogel due to persistent vaginal discharge, described as thin not thick. No improvement after x 5 days of topical metrogel. - Also concerned about STDs, discussed results last screening during prior pregnancy GC/Chlamydia negative 07/2015, HIV negative 12/2014 and RPR negative 06/2015. No new partners. Admits to sexual intercourse without condoms - Admits history of recurrent BV - Denies rash or lesions, dysuria, hematuria, fevers/chills, pelvic pain  CONTRACEPTION MANAGEMENT / OCPs - Recent pregnancy with delivery on 07/04/15 NSVD baby boy, delivered by OB/GYN Dr Clearance CootsHarper, last apt 08/14/15 had 6wk postpartum follow-up given rx OCP (combined) had never been on oral contraception previously - No missed period, Patient's last menstrual period was 10/28/2015. next menstrual cycle to begin with onset of bleeding approx 6/16 Requests pregnancy test today  TOBACCO ABUSE - Interested in quitting smoking, now smokes variable amounts up to 4-10 cigs every other day - Previously given 21mg  nicotine patches, seemed too strong and made her "crave" cigarettes   Social History  Substance Use Topics  . Smoking status: Current Every Day Smoker -- 0.50 packs/day    Types: Cigarettes    Last Attempt to Quit: 11/13/2014  . Smokeless tobacco: Never Used  . Alcohol Use: No    Review of Systems Per HPI unless specifically indicated above     Objective:    BP 118/77 mmHg  Pulse 100  Temp(Src) 98.5 F (36.9 C) (Oral)  Ht 5\' 7"  (1.702 m)  Wt 109 lb 9.6 oz (49.714 kg)  BMI 17.16 kg/m2  LMP 10/28/2015  Wt Readings from Last 3 Encounters:  11/20/15  109 lb 9.6 oz (49.714 kg)  08/14/15 129 lb (58.514 kg)  08/05/15 127 lb (57.607 kg)    Physical Exam  Constitutional: She appears well-developed and well-nourished. No distress.  Well-appearing, comfortable, cooperative  Cardiovascular: Normal rate.   Abdominal: Soft. She exhibits no distension. There is no tenderness.  Genitourinary:  Declined pelvic exam today, opted for self collect specimen  Skin: Skin is warm and dry. She is not diaphoretic.  Nursing note and vitals reviewed.  Results for orders placed or performed in visit on 11/20/15  POCT urine pregnancy  Result Value Ref Range   Preg Test, Ur Negative Negative  POCT Wet Prep [facility]([place])  Result Value Ref Range   Source Wet Prep POC vaginal    WBC, Wet Prep HPF POC 0-5    Bacteria Wet Prep HPF POC Many (A) None, Few, Too numerous to count   Clue Cells Wet Prep HPF POC Moderate (A) None, Too numerous to count   Clue Cells Wet Prep Whiff POC Positive Whiff    Yeast Wet Prep HPF POC None    Trichomonas Wet Prep HPF POC none       Assessment & Plan:   Problem List Items Addressed This Visit    Vaginal discharge    Consistent with BV on wet prep. 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 Recently just self treated with prior metrogel, has not finished yet but not resolving. - Advised to wait several days after complete metrogel x 5 days,  then may start trial on Flagyl x 7 days sent rx to pharmacy, pick up only if symptoms unresolved      Relevant Orders   POCT Wet Prep [facility]) (Completed)   Tobacco abuse    Interested in quitting. Has 21 mg nicotine patches from ED, too high for amt patient smokes - Rx  Nicotine patches, instructions on weaning off cigarettes      Relevant Medications   nicotine (NICODERM CQ - DOSED IN MG/24 HR) 7 mg/24hr patch   Contraception management - Primary    Start mixed OCPs per OB/GYN, following recent pregnancy 07/04/15, rx given at 6 wk  postpartum visit but patient never started. No amenorrhea. Pregnancy test negative today. - Start placebo pills with onset of next menstrual cycle, take daily, follow-up as needed with GYN or here       Other Visit Diagnoses    Possible pregnancy        Relevant Orders    POCT urine pregnancy (Completed)    BV (bacterial vaginosis)        Relevant Medications    metroNIDAZOLE (FLAGYL) 500 MG tablet       Meds ordered this encounter  Medications  . nicotine (NICODERM CQ - DOSED IN MG/24 HR) 7 mg/24hr patch    Sig: Place 1 patch (7 mg total) onto the skin daily.    Dispense:  21 patch    Refill:  0  . Prenatal Multivit-Min-Fe-FA (PRENATAL/IRON) TABS    Sig: 1 tablet daily    Dispense:  30 each    Refill:  5  . metroNIDAZOLE (FLAGYL) 500 MG tablet    Sig: Take 1 tablet (500 mg total) by mouth 2 (two) times daily. Do not drink alcohol while taking this medicine.    Dispense:  14 tablet    Refill:  0      Follow up plan: Return in about 3 months (around 02/20/2016), or if symptoms worsen or fail to improve.  Saralyn Pilar, DO [facility], PGY-3

## 2015-11-20 NOTE — Assessment & Plan Note (Signed)
Interested in quitting. Has 21 mg nicotine patches from ED, too high for amt patient smokes - Rx 7mg  Nicotine patches, instructions on weaning off cigarettes

## 2015-11-20 NOTE — Assessment & Plan Note (Signed)
Start mixed OCPs per OB/GYN, following recent pregnancy 07/04/15, rx given at 6 wk postpartum visit but patient never started. No amenorrhea. Pregnancy test negative today. - Start placebo pills with onset of next menstrual cycle, take daily, follow-up as needed with GYN or here

## 2015-11-20 NOTE — Assessment & Plan Note (Signed)
Consistent with BV on wet prep. 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 Recently just self treated with prior metrogel, has not finished yet but not resolving. - Advised to wait several days after complete metrogel x 5 days, then may start trial on Flagyl x 7 days sent rx to pharmacy, pick up only if symptoms unresolved

## 2015-11-20 NOTE — Patient Instructions (Signed)
Thank you for coming in to clinic today.  1. Your symptoms sound consistent with a Vaginitis - irritation from likely BV or yeast. 2. Tested wet prep today looking for BV, Yeast, and Trichomonas - will call you with results. - If needed will send in Metronidazole 500mg  twice daily for 7 days OR repeat Metrogel rx (for either BV or Trich) no alcohol on this med. - If positive for Yeast - will send Diflucan 150mg  pill, take 1 and then on Day 3 take 2nd pill. - If both medicines sent, finish Metronidazole FIRST then take Diflucan.  If tests negative, maybe normal symptoms following period. Unprotected intercourse can cause irritation and no infection sometimes. Make sure to try using condoms to see if reduces your symptoms. Probiotics and yogurt can help reduce BV as well.  Pregnancy test was NEGATIVE today  Start birth control pills on first day of your next menstrual cycle, 1st day of bleeding should be 1st day of "placeob or fake" pills, these are usually marked for 1 week, and then the hormone pills start as soon as your bleeding stops  Please schedule a follow-up appointment with Dr Althea CharonKaramalegos as needed  If you have any other questions or concerns, please feel free to call the clinic to contact me. You may also schedule an earlier appointment if necessary.  However, if your symptoms get significantly worse, please go to the Emergency Department to seek immediate medical attention.  Courtney PilarAlexander Karamalegos, DO [facility]

## 2015-12-11 ENCOUNTER — Other Ambulatory Visit: Payer: Self-pay | Admitting: Family Medicine

## 2015-12-18 ENCOUNTER — Other Ambulatory Visit (HOSPITAL_COMMUNITY)
Admission: RE | Admit: 2015-12-18 | Discharge: 2015-12-18 | Disposition: A | Discharge status: Home / Self Care | Payer: Medicaid Other | Source: Ambulatory Visit | Attending: Family Medicine | Admitting: Family Medicine

## 2015-12-18 ENCOUNTER — Encounter: Payer: Self-pay | Admitting: Internal Medicine

## 2015-12-18 ENCOUNTER — Ambulatory Visit (INDEPENDENT_AMBULATORY_CARE_PROVIDER_SITE_OTHER): Payer: Medicaid Other | Admitting: Internal Medicine

## 2015-12-18 VITALS — BP 124/78 | HR 84 | Temp 98.2°F | Ht 67.0 in | Wt 107.0 lb

## 2015-12-18 DIAGNOSIS — Z7251 High risk heterosexual behavior: Secondary | ICD-10-CM

## 2015-12-18 DIAGNOSIS — Z113 Encounter for screening for infections with a predominantly sexual mode of transmission: Secondary | ICD-10-CM | POA: Insufficient documentation

## 2015-12-18 DIAGNOSIS — B373 Candidiasis of vulva and vagina: Secondary | ICD-10-CM | POA: Diagnosis not present

## 2015-12-18 DIAGNOSIS — B3731 Acute candidiasis of vulva and vagina: Secondary | ICD-10-CM

## 2015-12-18 LAB — POCT WET PREP (WET MOUNT): Clue Cells Wet Prep Whiff POC: NEGATIVE

## 2015-12-18 NOTE — Assessment & Plan Note (Signed)
Patient concerned about contracting STD after unprotected intercourse. Negative GC/chlamydia 07/2015. Negative HIV 12/2014.  - Repeat GC/chlamydia and HIV today - Counseled patient on safe sex practices

## 2015-12-18 NOTE — Patient Instructions (Addendum)
It was nice meeting you today Ms. Courtney Adams.   For your yeast infection, you can take the diflucan you have been previously prescribed. If your symptoms do not improve, please call to make another appointment.   We will call you with the results of your testing within the next few days.   If you have any questions or concerns, please feel free to call the clinic.   Be well,  Dr. Natale MilchLancaster

## 2015-12-18 NOTE — Assessment & Plan Note (Signed)
Yeast noted on wet prep. Patient already with diflucan prescription at home as she frequently gets yeast infections.  - Complete diflucan course - Call if no improvement in symptoms

## 2015-12-18 NOTE — Progress Notes (Signed)
   Subjective:    Patient ID: Courtney Adams, female    DOB: 08/26/1985, 30 y.o.   MRN: [id number]  HPI  Patient presents for STD screening.   STD screening Patient reports concerns that she may have an STD. She was diagnosed with BV one month ago. She completed a course of Metrogel and has started taking PO metronidazole, but reports resolution of symptoms. Screened negative for GC/chlamydia five months ago, but reports having unprotected sex on multiple occasions since then. Patient reports recent vaginal irritation and a yellow thin discharge with foul odor. Denies dysuria, hematuria, increased urinary frequency.  Patient is taking OCPs and reports that she does miss a dose occasionally.   Review of Systems See HPI.     Objective:   Physical Exam  Constitutional: She is oriented to person, place, and time. She appears well-developed and well-nourished. No distress.  HENT:  Head: Normocephalic and atraumatic.  Pulmonary/Chest: Effort normal. No respiratory distress.  Genitourinary: Vaginal discharge (Brown watery discharge) found.  No cervical motion tenderness  Neurological: She is alert and oriented to person, place, and time.  Psychiatric: She has a normal mood and affect. Her behavior is normal.  Vitals reviewed.     Assessment & Plan:  High risk sexual behavior Patient concerned about contracting STD after unprotected intercourse. Negative GC/chlamydia 07/2015. Negative HIV 12/2014.  - Repeat GC/chlamydia and HIV today - Counseled patient on safe sex practices   Yeast vaginitis Yeast noted on wet prep. Patient already with diflucan prescription at home as she frequently gets yeast infections.  - Complete diflucan course - Call if no improvement in symptoms   Tarri AbernethyAbigail J Lancaster, MD, MPH PGY-2 [facility] Pager [phone]

## 2015-12-19 LAB — CERVICOVAGINAL ANCILLARY ONLY
Chlamydia: NEGATIVE
Neisseria Gonorrhea: NEGATIVE

## 2015-12-19 LAB — HIV ANTIBODY (ROUTINE TESTING W REFLEX): HIV 1&2 Ab, 4th Generation: NONREACTIVE

## 2015-12-20 ENCOUNTER — Other Ambulatory Visit: Payer: Self-pay | Admitting: Family Medicine

## 2015-12-22 NOTE — Telephone Encounter (Signed)
Spoke with patient regarding negative HIV, GC/chlamydia. Also informed patient that given her wet prep negative for BV at last visit, she does not need to take MetroGel at this time. Patient expressed understanding.   Tarri AbernethyAbigail J Lancaster, MD, MPH PGY-2 [facility] Pager [phone]

## 2016-02-24 ENCOUNTER — Encounter (HOSPITAL_COMMUNITY): Payer: Self-pay | Admitting: Emergency Medicine

## 2016-02-24 ENCOUNTER — Ambulatory Visit (HOSPITAL_COMMUNITY)
Admission: EM | Admit: 2016-02-24 | Discharge: 2016-02-24 | Disposition: A | Discharge status: Home / Self Care | Payer: Medicaid Other | Attending: Family Medicine | Admitting: Family Medicine

## 2016-02-24 DIAGNOSIS — R21 Rash and other nonspecific skin eruption: Secondary | ICD-10-CM | POA: Diagnosis present

## 2016-02-24 DIAGNOSIS — F17210 Nicotine dependence, cigarettes, uncomplicated: Secondary | ICD-10-CM | POA: Insufficient documentation

## 2016-02-24 DIAGNOSIS — F319 Bipolar disorder, unspecified: Secondary | ICD-10-CM | POA: Insufficient documentation

## 2016-02-24 DIAGNOSIS — N760 Acute vaginitis: Secondary | ICD-10-CM

## 2016-02-24 DIAGNOSIS — R197 Diarrhea, unspecified: Secondary | ICD-10-CM | POA: Insufficient documentation

## 2016-02-24 DIAGNOSIS — L259 Unspecified contact dermatitis, unspecified cause: Secondary | ICD-10-CM

## 2016-02-24 DIAGNOSIS — F419 Anxiety disorder, unspecified: Secondary | ICD-10-CM | POA: Diagnosis not present

## 2016-02-24 LAB — POCT URINALYSIS DIP (DEVICE)
Bilirubin Urine: NEGATIVE
Glucose, UA: NEGATIVE mg/dL
Hgb urine dipstick: NEGATIVE
Ketones, ur: NEGATIVE mg/dL
Leukocytes, UA: NEGATIVE
Nitrite: NEGATIVE
Protein, ur: 100 mg/dL — AB
Specific Gravity, Urine: 1.025 (ref 1.005–1.030)
Urobilinogen, UA: 1 mg/dL (ref 0.0–1.0)
pH: 7 (ref 5.0–8.0)

## 2016-02-24 LAB — POCT PREGNANCY, URINE: Preg Test, Ur: NEGATIVE

## 2016-02-24 MED ORDER — MOMETASONE FUROATE 0.1 % EX CREA
1.0000 | TOPICAL_CREAM | Freq: Every day | CUTANEOUS | 1 refills | Status: DC
Start: 2016-02-24 — End: 2016-03-11

## 2016-02-24 NOTE — ED Triage Notes (Signed)
Patient reports diarrhea and abdominal cramping .  Patient has had 3 episodes of diarrhea today.  Patient reports facial itching and rash.    Patient has some vaginal irritation.   Patient reports missing two days of birth control pills.

## 2016-02-24 NOTE — Discharge Instructions (Signed)
We will call with test results and treat as indicated °

## 2016-02-24 NOTE — ED Provider Notes (Addendum)
[facility]    CSN: [id number] Arrival date & time: 02/24/16  1648  First Provider Contact:  First MD Initiated Contact with Patient 02/24/16 1801        History   Chief Complaint Chief Complaint  Patient presents with  . Rash  . Diarrhea    HPI Courtney Adams is a 30 y.o. female.    Illness  Location:  Rash behind right ear for 2days, also diarrhea and vag d/c for 1 week, no sex since  Severity:  Mild Progression:  Worsening Chronicity:  New Associated symptoms: diarrhea and rash   Associated symptoms: no abdominal pain, no fever, no nausea and no vomiting     Past Medical History:  Diagnosis Date  . Abortion   . Anemia   . Anxiety   . Anxiety   . Bipolar 1 disorder (HCC)   . BV (bacterial vaginosis)   . Chlamydia   . Gonorrhea   . Infection    UTI  . Preterm delivery   . Preterm labor   . Trichomonas     Patient Active Problem List   Diagnosis Date Noted  . Tobacco abuse 11/20/2015  . NVD (normal vaginal delivery) 07/04/2015  . Active labor 07/03/2015  . Headache in pregnancy 01/03/2015  . Encounter for supervision of other normal pregnancy 01/03/2015  . Yeast vaginitis 07/11/2014  . Dry skin 07/11/2014  . Contact dermatitis 01/22/2014  . Occipital lymphadenopathy 01/22/2014  . Hypotension, unspecified 01/22/2014  . Vaginal discharge 12/19/2013  . High risk sexual behavior 10/19/2013  . Hand cramps 10/08/2013  . Bacterial vaginosis 06/06/2013  . Pre-syncope 05/31/2013  . Back pain of thoracolumbar region 05/04/2013  . Diarrhea 05/04/2013  . Furunculus 03/28/2013  . Mood disorder (HCC) 06/19/2012  . Substance abuse 06/19/2012  . Contraception management 06/19/2012  . Health care maintenance 06/19/2012    Past Surgical History:  Procedure Laterality Date  . DILATION AND CURETTAGE OF UTERUS      OB History    Gravida Para Term Preterm AB Living   5 2 1 1 3 1    SAB TAB Ectopic Multiple Live Births     3   0 1      Obstetric Comments   - Reportedly had PTL, advised bedrest at 6 months, advised no sexual activity but continued and thinks this may have contributed to pre-term delivery. Delivery NSVD at [facility] - NICU for 16 days       Home Medications    Prior to Admission medications   Medication Sig Start Date End Date Taking? Authorizing Provider  fluconazole (DIFLUCAN) 150 MG tablet TAKE 1 TABLET(150 MG) BY MOUTH 1 TIME. REPEAT DOSE ON DAY 3 IF SYMPTOMS PERSIST 12/12/15   Smitty Cords, DO  metroNIDAZOLE (FLAGYL) 500 MG tablet Take 1 tablet (500 mg total) by mouth 2 (two) times daily. Do not drink alcohol while taking this medicine. 11/20/15   Smitty Cords, DO  metroNIDAZOLE (METROGEL VAGINAL) 0.75 % vaginal gel Place 1 Applicatorful vaginally 2 (two) times daily. Use for 5 days. Patient not taking: Reported on 08/14/2015 08/11/15   Smitty Cords, DO  nicotine (NICODERM CQ - DOSED IN MG/24 HR) 7 mg/24hr patch Place 1 patch (7 mg total) onto the skin daily. 11/20/15   Smitty Cords, DO  norethindrone-ethinyl estradiol 1/35 (ORTHO-NOVUM 1/35, 28,) tablet Take 1 tablet by mouth daily. Patient not taking: Reported on 11/06/2015 08/14/15   Brock Bad, MD  Prenatal Multivit-Min-Fe-FA (  PRENATAL/IRON) TABS 1 tablet daily 11/20/15   Smitty Cords, DO    Family History Family History  Problem Relation Age of Onset  . Anemia Mother   . Cervical cancer Sister   . Diabetes Maternal Grandmother   . Heart disease Paternal Grandmother   . Heart disease Paternal Grandfather   . Stroke Paternal Uncle     Social History Social History  Substance Use Topics  . Smoking status: Current Every Day Smoker    Packs/day: 0.50    Types: Cigarettes    Last attempt to quit: 11/13/2014  . Smokeless tobacco: Never Used  . Alcohol use No     Allergies   Review of patient's allergies indicates no known allergies.   Review of Systems Review of Systems    Constitutional: Negative for fever.  Gastrointestinal: Positive for diarrhea. Negative for abdominal pain, blood in stool, nausea and vomiting.  Genitourinary: Positive for vaginal discharge. Negative for flank pain, menstrual problem and pelvic pain.  Skin: Positive for rash.  All other systems reviewed and are negative.    Physical Exam Triage Vital Signs ED Triage Vitals  Enc Vitals Group     BP 02/24/16 1717 108/77     Pulse Rate 02/24/16 1717 82     Resp 02/24/16 1717 12     Temp 02/24/16 1717 99.7 F (37.6 C)     Temp Source 02/24/16 1717 Oral     SpO2 02/24/16 1717 100 %     Weight --      Height --      Head Circumference --      Peak Flow --      Pain Score 02/24/16 1733 5     Pain Loc --      Pain Edu? --      Excl. in GC? --    No data found.   Updated Vital Signs BP 108/77 (BP Location: Left Arm)   Pulse 82   Temp 99.7 F (37.6 C) (Oral)   Resp 12   SpO2 100%   Visual Acuity Right Eye Distance:   Left Eye Distance:   Bilateral Distance:    Right Eye Near:   Left Eye Near:    Bilateral Near:     Physical Exam  Constitutional: She appears well-developed and well-nourished. No distress.  HENT:  Right Ear: External ear normal.  Left Ear: External ear normal.  Mouth/Throat: Oropharynx is clear and moist.  Eyes: Pupils are equal, round, and reactive to light.  Neck: Normal range of motion. Neck supple.  Cardiovascular: Normal rate and regular rhythm.   Abdominal: Soft. Bowel sounds are normal. There is no tenderness. There is no guarding.  Skin: Skin is warm and dry. Rash noted.  Nursing note and vitals reviewed.    UC Treatments / Results  Labs (all labs ordered are listed, but only abnormal results are displayed) Labs Reviewed - No data to display  EKG  EKG Interpretation None       Radiology No results found.  Procedures Procedures (including critical care time)  Medications Ordered in UC Medications - No data to  display   Initial Impression / Assessment and Plan / UC Course  I have reviewed the triage vital signs and the nursing notes.  Pertinent labs & imaging results that were available during my care of the patient were reviewed by me and considered in my medical decision making (see chart for details).  Clinical Course      Final  Clinical Impressions(s) / UC Diagnoses   Final diagnoses:  None    New Prescriptions New Prescriptions   No medications on file     Linna HoffJames D Jazma Pickel, MD 02/24/16 1817    Linna HoffJames D Jamai Dolce, MD 02/24/16 [phone]

## 2016-02-25 LAB — URINE CYTOLOGY ANCILLARY ONLY
Chlamydia: NEGATIVE
Neisseria Gonorrhea: NEGATIVE
Trichomonas: NEGATIVE

## 2016-02-27 LAB — URINE CYTOLOGY ANCILLARY ONLY
Bacterial vaginitis: NEGATIVE
Candida vaginitis: NEGATIVE

## 2016-03-01 ENCOUNTER — Other Ambulatory Visit: Payer: Self-pay | Admitting: Family Medicine

## 2016-03-03 ENCOUNTER — Other Ambulatory Visit: Payer: Self-pay | Admitting: General Practice

## 2016-03-03 NOTE — Telephone Encounter (Signed)
Patient calling to request medication refill for fluconazole (DIFLUCAN) 150 MG tablet [[id number]][[id number]]

## 2016-03-04 ENCOUNTER — Telehealth: Payer: Self-pay | Admitting: Internal Medicine

## 2016-03-04 ENCOUNTER — Telehealth (HOSPITAL_COMMUNITY): Payer: Self-pay | Admitting: Emergency Medicine

## 2016-03-04 NOTE — Telephone Encounter (Signed)
Pt called.... notified of recent lab results from visit 9/12 Pt ID'd properly... Reports feeling better and sx have subsided Adv pt if sx are not getting better to return or to f/u w/PCP Education on safe sex given Pt verb understanding.

## 2016-03-04 NOTE — Telephone Encounter (Signed)
Patient came into office needing RX for yeast infection. Request this be called into [facility] [address] Please let patient know when done. [phone].

## 2016-03-08 ENCOUNTER — Encounter: Payer: Self-pay | Admitting: *Deleted

## 2016-03-08 NOTE — Telephone Encounter (Signed)
Tried calling, no answer, no voicemail. If patient calls back please inform her that she will need appointment before medication can be prescribed.

## 2016-03-09 NOTE — Telephone Encounter (Signed)
Patient called and states that she dropped off a letter on Friday requesting a provider change.  She states that she has been dealing with this yeast infection for a few weeks and would like medication to be called in.  She also would like to speak with the clinic director regarding her letter she left on 03-05-16.  Will forward to FedExjeannette richardson, RN. Burnard HawthorneJazmin Hartsell,CMA

## 2016-03-10 NOTE — Telephone Encounter (Signed)
Patient here in office to discuss request to change to new PCP.  Doesn't feel she is a good fit with Dr. Natale MilchLancaster and wants a new PCP, preferably someone more "experienced and a female doctor".  Has been having "discharge in panties and it's uncomfortable".  Made appt for tomorrow with Dr. Leveda AnnaHensel for 9:15 am for vaginal discharge/diflucan refill.  Discussed can grant one-time request to change PCP and patient verbalized understanding.  Will change PCP to Dr. Abelardo DieselMcMullen for future visits.  Altamese Dilling~Jeannette Richardson, BSN, RN-BC

## 2016-03-11 ENCOUNTER — Ambulatory Visit (INDEPENDENT_AMBULATORY_CARE_PROVIDER_SITE_OTHER): Payer: Medicaid Other | Admitting: Family Medicine

## 2016-03-11 ENCOUNTER — Encounter: Payer: Self-pay | Admitting: Family Medicine

## 2016-03-11 VITALS — BP 107/68 | HR 92 | Temp 98.7°F | Ht 67.0 in | Wt 108.2 lb

## 2016-03-11 DIAGNOSIS — N898 Other specified noninflammatory disorders of vagina: Secondary | ICD-10-CM | POA: Diagnosis present

## 2016-03-11 DIAGNOSIS — N760 Acute vaginitis: Secondary | ICD-10-CM

## 2016-03-11 LAB — POCT WET PREP (WET MOUNT)
Clue Cells Wet Prep Whiff POC: NEGATIVE
Trichomonas Wet Prep HPF POC: ABSENT

## 2016-03-11 MED ORDER — FLUCONAZOLE 150 MG PO TABS: 150.0000 mg | ORAL_TABLET | Freq: Once | ORAL | 0 refills | Status: AC

## 2016-03-11 NOTE — Assessment & Plan Note (Signed)
By wet prep and my exam, she really has no vaginitis.  Will treat with diflucan x 1 since she has had recurrent yeast vaginitis and states her symptoms match previous infections.

## 2016-03-11 NOTE — Patient Instructions (Signed)
Your exam and test looked good. I will treat you as a yeast infection because of the way you feel. See us as needed.

## 2016-03-11 NOTE — Progress Notes (Signed)
   Subjective:    Patient ID: Tommi EmerySophia D Mori, female    DOB: 06/26/1985, 30 y.o.   MRN: [id number]  HPI C/O vag DC.  [facility] - notes and results reviewed.  Hx of recurrent yeast and BV infections.   States now at low risk for STDs - decreased partners and always uses condoms.   Was treated as yeast.  Feels treatment is incomplete.  Does not douche or use feminine hygeine products/spray.    Review of Systems     Objective:   Physical Exam Pelvic, no odor.  Vagina looks clean, no visible DC.  Wet prep taken and results reviewed.       Assessment & Plan:

## 2016-07-04 NOTE — Progress Notes (Signed)
   CC: Vaginal discharge  HPI: Courtney Adams is a 31 y.o. female with PMH significant for mood disorder, tobacco abuse, substance abuse who [facility] with vaginal discharge of two weeks duration  VAGINAL DISCHARGE  Onset: 2 weeks Description: dark yellow Odor: "like a yeast infection"  Itching: No  Symptoms Dysuria: no  Bleeding: no  Pelvic pain: no  Back pain: no  Fever: no  Genital sores: no  Rash: no  Dyspareunia: no  GI Sxs: no  Prior treatment: Treated for a yeast infection in 03/11/2016  Red Flags: Missed period: she is unsure when her LMP was, maybe May 28 2016 Pregnancy: Uses OCP, uses condoms sometimes, does miss doses of OCP Recent antibiotics: no  Sexual activity: yes, two female partners. Disclosed to CMA that one was recently released from prison Possible STD exposure: yes  IUD: No  Diabetes: no   Review of Symptoms:  See HPI for ROS.   CC, SH/smoking status, and VS noted.  Objective: BP 100/60   Pulse 91   Temp 98.6 F (37 C) (Oral)   Ht 5\' 7"  (1.702 m)   Wt 114 lb (51.7 kg)   SpO2 99%   BMI 17.85 kg/m  GEN: NAD, alert, cooperative, and pleasant. GI: soft, non-tender, non-distended, normoactive bowel sounds, no hepatosplenomegaly GU:no external vaginal lesions, cervix without abnormality, +yellow/green mucous cervical discharge, no cervical motion tenderness SKIN: warm and dry, no rashes or lesions PSYCH: AAOx3, appropriate affect   Assessment and plan:  Vaginal discharge Patient with high risk sexual behavior, intermittent condom use and missed doses of OCPs.  Pregnancy ruled out with negative BHcg today. - Wet prep performed and results were reviewed. No yeast infection, no BV, no trichomonas.  - GC/Chlamydia, RPR, HIV tests ordered, will follow up results  - discussed more consistent condom use with the patient    Orders Placed This Encounter  Procedures  . HIV antibody  . RPR  . POCT Wet Prep [employer](Wet Mount)  . POCT  urine pregnancy    No orders of the defined types were placed in this encounter.    Howard PouchLauren Ophia Shamoon, MD,MS,  PGY1 07/05/2016 9:31 AM

## 2016-07-05 ENCOUNTER — Encounter: Payer: Self-pay | Admitting: Student in an Organized Health Care Education/Training Program

## 2016-07-05 ENCOUNTER — Other Ambulatory Visit (HOSPITAL_COMMUNITY)
Admission: RE | Admit: 2016-07-05 | Discharge: 2016-07-05 | Disposition: A | Discharge status: Home / Self Care | Payer: Medicaid Other | Source: Ambulatory Visit | Attending: Family Medicine | Admitting: Family Medicine

## 2016-07-05 ENCOUNTER — Ambulatory Visit (INDEPENDENT_AMBULATORY_CARE_PROVIDER_SITE_OTHER): Payer: Medicaid Other | Admitting: Student in an Organized Health Care Education/Training Program

## 2016-07-05 VITALS — BP 100/60 | HR 91 | Temp 98.6°F | Ht 67.0 in | Wt 114.0 lb

## 2016-07-05 DIAGNOSIS — N898 Other specified noninflammatory disorders of vagina: Secondary | ICD-10-CM

## 2016-07-05 DIAGNOSIS — Z113 Encounter for screening for infections with a predominantly sexual mode of transmission: Secondary | ICD-10-CM | POA: Diagnosis present

## 2016-07-05 LAB — POCT WET PREP (WET MOUNT)
Clue Cells Wet Prep Whiff POC: NEGATIVE
Trichomonas Wet Prep HPF POC: ABSENT

## 2016-07-05 LAB — POCT URINE PREGNANCY: Preg Test, Ur: NEGATIVE

## 2016-07-05 LAB — HIV ANTIBODY (ROUTINE TESTING W REFLEX): HIV 1&2 Ab, 4th Generation: NONREACTIVE

## 2016-07-05 NOTE — Patient Instructions (Addendum)
It was a pleasure seeing you today in our clinic. Today we discussed vaginal discharge. Here is the treatment plan we have discussed and agreed upon together:  - Your test was negative for a yeast infection. - Your pregnancy test was negative. - We also tested you for sexually transmitted infections. I will notify you of these results when they become available.  Our clinic's number is [phone]. Please call with questions or concerns about what we discussed today.  Be well, Dr. Mosetta PuttFeng

## 2016-07-05 NOTE — Assessment & Plan Note (Addendum)
Patient with high risk sexual behavior, intermittent condom use and missed doses of OCPs.  Pregnancy ruled out with negative BHcg today. - Wet prep performed and results were reviewed. No yeast infection, no BV, no trichomonas.  - GC/Chlamydia, RPR, HIV tests ordered, will follow up results  - discussed more consistent condom use with the patient

## 2016-07-06 LAB — GC/CHLAMYDIA PROBE AMP (~~LOC~~) NOT AT ARMC
Chlamydia: NEGATIVE
Neisseria Gonorrhea: NEGATIVE

## 2016-07-06 LAB — RPR

## 2016-07-08 ENCOUNTER — Encounter: Payer: Self-pay | Admitting: Student in an Organized Health Care Education/Training Program

## 2016-10-18 ENCOUNTER — Other Ambulatory Visit (HOSPITAL_COMMUNITY)
Admission: RE | Admit: 2016-10-18 | Discharge: 2016-10-18 | Disposition: A | Discharge status: Home / Self Care | Payer: Medicaid Other | Source: Ambulatory Visit | Attending: Family Medicine | Admitting: Family Medicine

## 2016-10-18 ENCOUNTER — Encounter: Payer: Self-pay | Admitting: Family Medicine

## 2016-10-18 ENCOUNTER — Ambulatory Visit (INDEPENDENT_AMBULATORY_CARE_PROVIDER_SITE_OTHER): Payer: Medicaid Other | Admitting: Family Medicine

## 2016-10-18 VITALS — BP 90/60 | HR 74 | Temp 98.5°F | Ht 67.0 in | Wt 111.0 lb

## 2016-10-18 DIAGNOSIS — Z7251 High risk heterosexual behavior: Secondary | ICD-10-CM | POA: Diagnosis present

## 2016-10-18 DIAGNOSIS — Z30012 Encounter for prescription of emergency contraception: Secondary | ICD-10-CM

## 2016-10-18 LAB — POCT URINE PREGNANCY: Preg Test, Ur: NEGATIVE

## 2016-10-18 MED ORDER — LEVONORGESTREL 0.75 MG PO TABS: 0.7500 mg | ORAL_TABLET | Freq: Two times a day (BID) | ORAL | 0 refills | Status: DC

## 2016-10-18 NOTE — Progress Notes (Signed)
   Subjective:   Patient ID: Courtney Adams    DOB: 07/07/1985, 31 y.o. female   MRN: [id number]  CC: emergency contraception   HPI: Courtney Adams is a 31 y.o. female who presents to clinic today for emergency contraception.  Is present today with her 2 sons.  She had unprotected sex with the father of her children yesterday and is sure she may get pregnant.  States she does not want anymore children and would like Plan B today in addition to STD testing to make sure she is clean.  She does not currently endorse any urinary symptoms, discharge or irritation.  She is unsure of her last LMP.  Was previously on OCPs for contraception but stopped taking them because they "made her hair fall out."  No other concerns at this visit.    ROS: Denies fevers, chills, shortness of breath, abdominal pain.  Denies vaginal discharge, urinary symptoms.    PMFSH: Pertinent past medical, surgical, family, and social history were reviewed and updated as appropriate. Smoking status reviewed. Medications reviewed.  Objective:   BP 90/60   Pulse 74   Temp 98.5 F (36.9 C) (Oral)   Ht 5\' 7"  (1.702 m)   Wt 111 lb (50.3 kg)   SpO2 99%   BMI 17.39 kg/m  Vitals and nursing note reviewed.  General: 31 yo F, NAD  Neck: supple  CV: RRR no MRG  Lungs: CTA B/L  Abdomen: soft, NTND, +bs Skin: warm, dry  Psych: normal mood and affect   Assessment & Plan:   Contraception management Would like emergency contraception.  Reports unprotected sex within last 24 hours.  Patient used to be on OCPs however she stopped taking them because of hair fallout.  Uses condoms but not consistently.  Will need to think of long-term contraception as she does not want any more children.  Urine pregnancy test collected. -UPT negative  -Urine G/C chlamydia not able to be tested as she did not provide adequate catch.  She will come in tomorrow for lab visit and provide sample for testing.  -HIV and RPR ordered -RX for Plan B  given  -Discussed with her that she may want to think about long-term contraception and reviewed several options with her today.  She will follow up with her PCP to select one.   Orders Placed This Encounter  Procedures  . HIV antibody  . RPR  . POCT urine pregnancy   Meds ordered this encounter  Medications  . levonorgestrel (PLAN B) 0.75 MG tablet    Sig: Take 1 tablet (0.75 mg total) by mouth every 12 (twelve) hours.    Dispense:  2 tablet    Refill:  0    Freddrick MarchYashika Lacy Taglieri, MD [facility], PGY-1 10/20/2016 12:01 AM

## 2016-10-18 NOTE — Patient Instructions (Addendum)
It was nice meeting you today! You were seen in clinic for a urine pregnancy test and STD check.  I will follow up with the results of your testing.  Your pregnancy test today was negative.  You were also given the plan B pill today as you had requested.   You can make a follow up appointment to discuss alternate forms of birth control for the long term.  Some of these options include a Nexplanon, IUD, Depo shot, or birth control pills as you have tried in the past.    Be well,  Freddrick MarchYashika Shakiya Mcneary, MD

## 2016-10-19 ENCOUNTER — Other Ambulatory Visit: Payer: Medicaid Other

## 2016-10-19 LAB — RPR: RPR Ser Ql: NONREACTIVE

## 2016-10-19 LAB — HIV ANTIBODY (ROUTINE TESTING W REFLEX): HIV Screen 4th Generation wRfx: NONREACTIVE

## 2016-10-20 LAB — URINE CYTOLOGY ANCILLARY ONLY
Chlamydia: NEGATIVE
Neisseria Gonorrhea: NEGATIVE

## 2016-10-20 NOTE — Assessment & Plan Note (Addendum)
Would like emergency contraception.  Reports unprotected sex within last 24 hours.  Patient used to be on OCPs however she stopped taking them because of hair fallout.  Uses condoms but not consistently.  Will need to think of long-term contraception as she does not want any more children.  Urine pregnancy test collected. -UPT negative  -Urine G/C chlamydia not able to be tested as she did not provide adequate catch.  She will come in tomorrow for lab visit and provide sample for testing.  -HIV and RPR ordered -RX for Plan B given  -Discussed with her that she may want to think about long-term contraception and reviewed several options with her today.  She will follow up with her PCP to select one.

## 2016-10-22 ENCOUNTER — Telehealth: Payer: Self-pay | Admitting: Family Medicine

## 2016-10-22 NOTE — Telephone Encounter (Signed)
Would like lab results

## 2016-10-25 ENCOUNTER — Other Ambulatory Visit: Payer: Self-pay | Admitting: Family Medicine

## 2016-10-25 DIAGNOSIS — N760 Acute vaginitis: Secondary | ICD-10-CM

## 2016-10-25 DIAGNOSIS — B9689 Other specified bacterial agents as the cause of diseases classified elsewhere: Secondary | ICD-10-CM

## 2016-10-25 NOTE — Telephone Encounter (Signed)
Pt informed of her negative lab results. Pt says she is having itching and wanting to know if the dr would call her in something. She was upset that she didn't get a wet prep when she came in. According to her note she denied any itching or vaginal discharge. Told pt dr would likely want her to schedule an appt to have wet prep done, she declined. Please advise. Deseree Bruna PotterBlount, CMA

## 2016-10-25 NOTE — Telephone Encounter (Signed)
Would need to obtain wet prep to evaluate itching. Please advise patient to schedule appointment if persists. Thanks. -- Durward Parcelavid McMullen, DO [facility], PGY-1

## 2016-10-26 ENCOUNTER — Ambulatory Visit: Payer: Medicaid Other | Admitting: Internal Medicine

## 2016-10-26 NOTE — Telephone Encounter (Signed)
Patient has appointment scheduled today.

## 2016-10-27 ENCOUNTER — Ambulatory Visit (INDEPENDENT_AMBULATORY_CARE_PROVIDER_SITE_OTHER): Payer: Medicaid Other | Admitting: Internal Medicine

## 2016-10-27 ENCOUNTER — Encounter: Payer: Self-pay | Admitting: Internal Medicine

## 2016-10-27 VITALS — BP 95/60 | HR 73 | Temp 98.3°F | Ht 67.0 in | Wt 111.0 lb

## 2016-10-27 DIAGNOSIS — N898 Other specified noninflammatory disorders of vagina: Secondary | ICD-10-CM | POA: Diagnosis present

## 2016-10-27 LAB — POCT WET PREP (WET MOUNT)
Clue Cells Wet Prep Whiff POC: POSITIVE
Trichomonas Wet Prep HPF POC: ABSENT

## 2016-10-27 MED ORDER — METRONIDAZOLE 0.75 % VA GEL: 1.0000 | Freq: Every day | VAGINAL | 0 refills | Status: DC

## 2016-10-27 MED ORDER — FLUCONAZOLE 150 MG PO TABS: 150.0000 mg | ORAL_TABLET | Freq: Once | ORAL | 0 refills | Status: DC

## 2016-10-27 MED ORDER — METRONIDAZOLE 500 MG PO TABS: 500.0000 mg | ORAL_TABLET | Freq: Two times a day (BID) | ORAL | 0 refills | Status: DC

## 2016-10-27 NOTE — Patient Instructions (Signed)
I have prescribed Flagyl to treat your Bacterial Vaginosis. You also have a few yeast on the swab and it is not uncommon to develop a yeast infection after taking Flagyl. I have prescibed Diflucan for the yeast.

## 2016-10-27 NOTE — Progress Notes (Signed)
   Subjective:    Courtney EmerySophia D Adams - 31 y.o. female MRN [id number]  Date of birth: 02/04/1986  HPI  Courtney Adams is here for SDA for vaginal discharge.  Vaginal Discharge: Has been present >1 week. Reports yellow-white itchy discharge with a "fishy smell". Was recently seen for STD testing after unprotected sexual intercourse. Gonorrhea and Chlamydia were negative. Patient reports she has not been sexually active since and declines repeat GC/Chlamydia testing. She denies dysuria and suprapubic pain. Afebrile at home.    -  reports that she has been smoking Cigarettes.  She has been smoking about 0.25 packs per day. She has never used smokeless tobacco. - Review of Systems: Per HPI. - Past Medical History: Patient Active Problem List   Diagnosis Date Noted  . Tobacco abuse 11/20/2015  . Vaginitis and vulvovaginitis 07/11/2014  . Dry skin 07/11/2014  . Contact dermatitis 01/22/2014  . Occipital lymphadenopathy 01/22/2014  . Hypotension, unspecified 01/22/2014  . Vaginal discharge 12/19/2013  . High risk sexual behavior 10/19/2013  . Back pain of thoracolumbar region 05/04/2013  . Mood disorder (HCC) 06/19/2012  . Substance abuse 06/19/2012  . Contraception management 06/19/2012   - Medications: reviewed and updated   Objective:   Physical Exam BP 95/60   Pulse 73   Temp 98.3 F (36.8 C)   Wt 111 lb (50.3 kg)   SpO2 99%   BMI 17.39 kg/m  Gen: NAD, alert, cooperative with exam, well-appearing GU/GYN: Exam performed in the presence of a chaperone. External genitalia within normal limits.  Vaginal mucosa pink, moist, normal rugae.  Nonfriable cervix without lesionsor bleeding noted on speculum exam. Moderate amount of thick yellowish discharge present.  Bimanual exam revealed normal, nongravid uterus.  No cervical motion tenderness. No adnexal masses bilaterally.      Assessment & Plan:   1. Vaginal discharge Wet prep consistent with BV. Also notable for a few yeast. Have  prescribed MetroGel at patient's request as PO Flagyl causes nausea/vomiting. Have prescribed Diflucan as well given few yeast on wet prep and patient reported history of yeast infections after treatment of BV.  - POCT Wet Prep (Wet [place]) - fluconazole (DIFLUCAN) 150 MG tablet; Take 1 tablet (150 mg total) by mouth once. Take second tablet 72 hours later if still having symptoms.  Dispense: 2 tablet; Refill: 0 - metroNIDAZOLE (METROGEL VAGINAL) 0.75 % vaginal gel; Place 1 Applicatorful vaginally at bedtime.  Dispense: 70 g; Refill: 0   Marcy Sirenatherine Wallace, D.O. 10/27/2016, 11:00 AM PGY-2, [facility]

## 2016-11-17 ENCOUNTER — Other Ambulatory Visit: Payer: Self-pay | Admitting: Family Medicine

## 2016-11-17 MED ORDER — LEVONORGESTREL 0.75 MG PO TABS: 0.7500 mg | ORAL_TABLET | Freq: Two times a day (BID) | ORAL | 0 refills | Status: DC

## 2016-11-17 NOTE — Telephone Encounter (Signed)
Pt would like to have Plan B called into [facility] [address]

## 2016-12-01 ENCOUNTER — Ambulatory Visit (INDEPENDENT_AMBULATORY_CARE_PROVIDER_SITE_OTHER): Payer: Medicaid Other | Admitting: Family Medicine

## 2016-12-01 ENCOUNTER — Encounter: Payer: Self-pay | Admitting: Family Medicine

## 2016-12-01 VITALS — BP 110/70 | HR 89 | Temp 98.6°F | Ht 67.0 in | Wt 108.8 lb

## 2016-12-01 DIAGNOSIS — R55 Syncope and collapse: Secondary | ICD-10-CM

## 2016-12-01 NOTE — Progress Notes (Signed)
Subjective:     Patient ID: Courtney Adams, female   DOB: 03/27/1986, 31 y.o.   MRN: [id number]  HPI Courtney Adams is a 31yo female presenting today for presyncope. Reports long history of occasional presyncope symptoms since she was 31yo. Episodes are usually centered around times she doesn't eat very much or when she is menstruating. Also more likely to occur if she stands for long periods or it is hot. Has not been eating much for the last several days and the day before yesterday she noted an episode of presyncope. States when these episodes occur, she develops sweating, decreased hearing, tunneled feeling, nausea, and lightheadedness. Resolves after sitting and eating. Denies palpitations, chest pain, or shortness of breath. Has never passed out without presyncopal symptoms. . Currently menstruating. Episodes are not becoming more frequent, however she is a mom now and is worried she will pass out while taking care of her children.  Review of Systems Per HPI    Objective:   Physical Exam  Constitutional: She is oriented to person, place, and time. She appears well-developed and well-nourished. No distress.  Eyes:  Pale conjunctiva  Cardiovascular: Normal rate and regular rhythm.   No murmur heard. Pulmonary/Chest: Effort normal. No respiratory distress. She has no wheezes.  Musculoskeletal:  Muscle strength 5/5 in upper and lower extremities  Neurological: She is alert and oriented to person, place, and time. No cranial nerve deficit.  Sensation intact  Psychiatric: She has a normal mood and affect. Her behavior is normal.      Assessment and Plan:     1. Syncope, near Not consistent with cardiac etiology. Suspect neurocardiogenic etiology, worsened with decreased eating and menstruation. Will check CBC, BMP, and TSH. Recommend eating at least 3 meals per day. May also try compression stockings. Return precautions given.

## 2016-12-01 NOTE — Patient Instructions (Signed)
I suspect your presyncope (almost passing out) symptoms are due to a mix of things, including poor appetite, menstruation, and your low weight. We will check several labs today. Please try to increase what you are eating--you may consider nutritional shakes, protein bars, and gatorade. You may also try compression stockings.  Please follow up with your PCP if no improvement.  Dr. Caroleen Hammanumley  Near-Syncope Near-syncope is when you suddenly become weak or dizzy, or you feel like you might pass out (faint). During an episode of near-syncope, you may:  Feel dizzy or light-headed.  Feel nauseous.  See all white or all black in your field of vision.  Have cold, clammy skin.  This condition is caused by a sudden decrease in blood flow to the brain. This decrease can result from various causes, but most of those causes are not dangerous. However, near-syncope can be a sign of a serious medical problem, so it is important to seek medical care. If you fainted, get medical help right away.Call your local emergency services (911 in the [place] Do not drive yourself to the hospital. Follow these instructions at home: Pay attention to any changes in your symptoms. Take these actions to help with your condition:  If you start to feel like you might faint, lie down right away and raise (elevate) your feet above the level of your heart. Breathe deeply and steadily. Wait until all of the symptoms have passed.  Drink enough fluid to keep your urine clear or pale yellow.  If you are taking blood pressure or heart medicine, get up slowly and take several minutes to sit and then stand. This can reduce dizziness.  Take over-the-counter and prescription medicines only as told by your health care provider.  Get help right away if:  You have a severe headache.  You have unusual pain in your chest, abdomen, or back.  You are bleeding from your mouth or rectum, or you have black or tarry stool.  You have a very  fast or irregular heartbeat (palpitations).  You faint once or repeatedly.  You have a seizure.  You are confused.  You have trouble walking.  You have severe weakness.  You have vision problems. These symptoms may represent a serious problem that is an emergency. Do not wait to see if your symptoms will go away. Get medical help right away. Call your local emergency services (911 in the [place] Do not drive yourself to the hospital. This information is not intended to replace advice given to you by your health care provider. Make sure you discuss any questions you have with your health care provider. Document Released: 05/31/2005 Document Revised: 11/06/2015 Document Reviewed: 02/12/2015 Elsevier Interactive Patient Education  2017 [employer]

## 2016-12-02 LAB — CBC
Hematocrit: 40.9 % (ref 34.0–46.6)
Hemoglobin: 13.4 g/dL (ref 11.1–15.9)
MCH: 31.9 pg (ref 26.6–33.0)
MCHC: 32.8 g/dL (ref 31.5–35.7)
MCV: 97 fL (ref 79–97)
Platelets: 258 10*3/uL (ref 150–379)
RBC: 4.2 x10E6/uL (ref 3.77–5.28)
RDW: 13 % (ref 12.3–15.4)
WBC: 3.1 10*3/uL — ABNORMAL LOW (ref 3.4–10.8)

## 2016-12-02 LAB — BASIC METABOLIC PANEL
BUN/Creatinine Ratio: 11 (ref 9–23)
BUN: 10 mg/dL (ref 6–20)
CO2: 24 mmol/L (ref 20–29)
Calcium: 9.6 mg/dL (ref 8.7–10.2)
Chloride: 103 mmol/L (ref 96–106)
Creatinine, Ser: 0.92 mg/dL (ref 0.57–1.00)
GFR calc Af Amer: 95 mL/min/{1.73_m2} (ref >59)
GFR calc non Af Amer: 83 mL/min/{1.73_m2} (ref >59)
Glucose: 87 mg/dL (ref 65–99)
Potassium: 3.6 mmol/L (ref 3.5–5.2)
Sodium: 143 mmol/L (ref 134–144)

## 2016-12-02 LAB — TSH: TSH: 0.735 u[IU]/mL (ref 0.450–4.500)

## 2016-12-05 ENCOUNTER — Encounter (HOSPITAL_COMMUNITY): Payer: Self-pay | Admitting: Emergency Medicine

## 2016-12-05 ENCOUNTER — Emergency Department (HOSPITAL_COMMUNITY)
Admission: EM | Admit: 2016-12-05 | Discharge: 2016-12-05 | Disposition: A | Discharge status: Home / Self Care | Payer: Medicaid Other | Attending: Emergency Medicine | Admitting: Emergency Medicine

## 2016-12-05 DIAGNOSIS — G479 Sleep disorder, unspecified: Secondary | ICD-10-CM | POA: Diagnosis not present

## 2016-12-05 DIAGNOSIS — F17210 Nicotine dependence, cigarettes, uncomplicated: Secondary | ICD-10-CM | POA: Diagnosis not present

## 2016-12-05 DIAGNOSIS — R5383 Other fatigue: Secondary | ICD-10-CM | POA: Diagnosis present

## 2016-12-05 DIAGNOSIS — Z79899 Other long term (current) drug therapy: Secondary | ICD-10-CM | POA: Insufficient documentation

## 2016-12-05 DIAGNOSIS — F419 Anxiety disorder, unspecified: Secondary | ICD-10-CM | POA: Diagnosis not present

## 2016-12-05 DIAGNOSIS — R638 Other symptoms and signs concerning food and fluid intake: Secondary | ICD-10-CM | POA: Diagnosis not present

## 2016-12-05 DIAGNOSIS — R630 Anorexia: Secondary | ICD-10-CM | POA: Diagnosis not present

## 2016-12-05 DIAGNOSIS — R636 Underweight: Secondary | ICD-10-CM

## 2016-12-05 LAB — BASIC METABOLIC PANEL
Anion gap: 6 (ref 5–15)
BUN: 10 mg/dL (ref 6–20)
CO2: 26 mmol/L (ref 22–32)
Calcium: 9.3 mg/dL (ref 8.9–10.3)
Chloride: 106 mmol/L (ref 101–111)
Creatinine, Ser: 0.82 mg/dL (ref 0.44–1.00)
GFR calc Af Amer: >60 mL/min (ref >60)
GFR calc non Af Amer: >60 mL/min (ref >60)
Glucose, Bld: 112 mg/dL — ABNORMAL HIGH (ref 65–99)
Potassium: 3.7 mmol/L (ref 3.5–5.1)
Sodium: 138 mmol/L (ref 135–145)

## 2016-12-05 LAB — URINALYSIS, ROUTINE W REFLEX MICROSCOPIC
Bacteria, UA: NONE SEEN
Bilirubin Urine: NEGATIVE
Glucose, UA: NEGATIVE mg/dL
Hgb urine dipstick: NEGATIVE
Ketones, ur: NEGATIVE mg/dL
Leukocytes, UA: NEGATIVE
Nitrite: NEGATIVE
Protein, ur: NEGATIVE mg/dL
RBC / HPF: NONE SEEN RBC/hpf (ref 0–5)
Specific Gravity, Urine: 1.019 (ref 1.005–1.030)
WBC, UA: NONE SEEN WBC/hpf (ref 0–5)
pH: 5 (ref 5.0–8.0)

## 2016-12-05 LAB — CBC
HCT: 40 % (ref 36.0–46.0)
Hemoglobin: 13.1 g/dL (ref 12.0–15.0)
MCH: 32.1 pg (ref 26.0–34.0)
MCHC: 32.8 g/dL (ref 30.0–36.0)
MCV: 98 fL (ref 78.0–100.0)
Platelets: 223 10*3/uL (ref 150–400)
RBC: 4.08 MIL/uL (ref 3.87–5.11)
RDW: 12.2 % (ref 11.5–15.5)
WBC: 4.4 10*3/uL (ref 4.0–10.5)

## 2016-12-05 LAB — CBG MONITORING, ED: Glucose-Capillary: 103 mg/dL — ABNORMAL HIGH (ref 65–99)

## 2016-12-05 MED ORDER — HYDROXYZINE HCL 25 MG PO TABS: 25.0000 mg | ORAL_TABLET | Freq: Four times a day (QID) | ORAL | 0 refills | Status: DC | PRN

## 2016-12-05 MED ORDER — SODIUM CHLORIDE 0.9 % IV BOLUS (SEPSIS)
2000.0000 mL | Freq: Once | INTRAVENOUS | Status: AC
  Administered 2016-12-05: 2000 mL via INTRAVENOUS

## 2016-12-05 NOTE — Discharge Instructions (Signed)
Try to increase your food and water intake, maybe by using nutritional shakes like ensure to boost your calorie intake. Stay very well hydrated. Use vistaril as directed as needed for anxiety and to help sleeping. Use compression stockings to help with lightheadedness. Follow up with your regular doctor in 1 week for recheck of symptoms. Return to the ER for emergent changes or worsening symptoms.

## 2016-12-05 NOTE — ED Provider Notes (Signed)
[facility]EM[facility]ENC[facility] DEPT Provider Note   CSN: [id number] Arrival date & time: 12/05/16  4098     History   Chief Complaint Chief Complaint  Patient presents with  . Chest Pain  . Fatigue    HPI Courtney Adams is a 31 y.o. female with a PMHx of anemia, anxiety, bipolar 1 disorder, and hypotension, who presents to the ED with multiple complaints. Her primary complaint is that she has been having a poor appetite for the last several weeks, has had difficulty sleeping because her "mind is racing" because she has been having a lot of anxiety, and therefore she has been fatigued. Last week on 11/29/16 she had a near syncopal episode, and although she's had intermittent lightheadedness since she was 31y/o, it concerned her because she has a 1y/o son at home and she wants to make sure she's okay because she needs to be able to take care of him. Chart review reveals that she was seen by the family practice clinic on 12/01/16 for "presyncope", describing the episodes' symptoms as "sweating, decreased hearing, tunneled feeling, nausea, and lightheadedness"; she also mentions today that she also had palpitations and a "tightness" in her epigastric area. She told the PCP that these episodes usually occur around her menstrual cycle time or if she doesn't eat or gets hot; improves with sitting and eating; she had labs done (CBC, BMP, TSH) which were all unremarkable. They advised that she use compression stockings and eat nutritional shakes to help gain weight and improve symptoms. Patient mentions to me that during that episode she had a cold right placed on her neck, laid down with her feet up, and drink water which all helped resolve the episode. She has not had a recurrence of a presyncopal episode again. She has never had a syncopal event after this presyncopal sensation. She noticed that the presyncopal episode occurred after she stood up from a laying position. She has been trying to drink ensure to help, which  she thinks has helped make her "not feel hungry" although she still doesn't have a strong appetite. She states that all the symptoms resolved after that episode and have not recurred, however the persisting symptoms are fatigue, decreased sleep, anxiety, and decreased appetite. No other known aggravating or alleviating factors. She works 12 hour shifts 3 days a week and does feel stressed, and thinks this may be contributing. She denies any changes in medications recently.   She denies syncope, fevers, chills, CP, SOB, abd pain, ongoing nausea, vomiting, diarrhea, constipation, melena, hematochezia, vaginal bleeding/discharge, hematuria, dysuria, myalgias, arthralgias, numbness, tingling, focal weakness, LE swelling, recent travel/surgery/immobilization, estrogen use, personal/family hx of DVT/PE, or any other complaints at this time. LMP 11/28/16.    The history is provided by the patient and medical records. No language interpreter was used.  Anxiety  This is a recurrent problem. The current episode started more than 1 week ago. The problem occurs constantly. The problem has not changed since onset.Pertinent negatives include no chest pain, no abdominal pain and no shortness of breath. The symptoms are aggravated by stress. Nothing relieves the symptoms. She has tried nothing for the symptoms. The treatment provided no relief.    Past Medical History:  Diagnosis Date  . Abortion   . Anemia   . Anxiety   . Anxiety   . Bipolar 1 disorder (HCC)   . BV (bacterial vaginosis)   . Chlamydia   . Gonorrhea   . Infection    UTI  . Preterm  delivery   . Preterm labor   . Trichomonas     Patient Active Problem List   Diagnosis Date Noted  . Tobacco abuse 11/20/2015  . Vaginitis and vulvovaginitis 07/11/2014  . Dry skin 07/11/2014  . Contact dermatitis 01/22/2014  . Occipital lymphadenopathy 01/22/2014  . Hypotension, unspecified 01/22/2014  . Vaginal discharge 12/19/2013  . High risk sexual  behavior 10/19/2013  . Back pain of thoracolumbar region 05/04/2013  . Mood disorder (HCC) 06/19/2012  . Substance abuse 06/19/2012  . Contraception management 06/19/2012    Past Surgical History:  Procedure Laterality Date  . DILATION AND CURETTAGE OF UTERUS      OB History    Gravida Para Term Preterm AB Living   5 2 1 1 3 1    SAB TAB Ectopic Multiple Live Births     3   0 1      Obstetric Comments   - Reportedly had PTL, advised bedrest at 6 months, advised no sexual activity but continued and thinks this may have contributed to pre-term delivery. Delivery [facility]VD at [facility] hospital - NICU for 16 days       Home Medications    Prior to Admission medications   Medication Sig Start Date End Date Taking? Authorizing Provider  levonorgestrel (PLAN B) 0.75 MG tablet Take 1 tablet (0.75 mg total) by mouth every 12 (twelve) hours. 11/17/16   Wendee Beavers, DO  metroNIDAZOLE (METROGEL VAGINAL) 0.75 % vaginal gel Place 1 Applicatorful vaginally at bedtime. 10/27/16   Arvilla Market, DO  norethindrone-ethinyl estradiol 1/35 (ORTHO-NOVUM 1/35, 28,) tablet Take 1 tablet by mouth daily. Patient not taking: Reported on 11/06/2015 08/14/15   Brock Bad, MD    Family History Family History  Problem Relation Age of Onset  . Anemia Mother   . Cervical cancer Sister   . Diabetes Maternal Grandmother   . Heart disease Paternal Grandmother   . Heart disease Paternal Grandfather   . Stroke Paternal Uncle     Social History Social History  Substance Use Topics  . Smoking status: Current Every Day Smoker    Packs/day: 0.25    Types: Cigarettes  . Smokeless tobacco: Never Used  . Alcohol use No     Allergies   Patient has no known allergies.   Review of Systems Review of Systems  Constitutional: Positive for appetite change and fatigue. Negative for chills, diaphoresis (none ongoing) and fever.  Respiratory: Negative for shortness of breath.   Cardiovascular:  Negative for chest pain, palpitations (none ongoing) and leg swelling.  Gastrointestinal: Negative for abdominal pain, blood in stool, constipation, diarrhea, nausea (none ongoing) and vomiting.  Genitourinary: Negative for dysuria, hematuria, vaginal bleeding and vaginal discharge.  Musculoskeletal: Negative for arthralgias and myalgias.  Skin: Negative for color change.  Allergic/Immunologic: Negative for immunocompromised state.  Neurological: Negative for syncope, weakness, light-headedness (none ongoing) and numbness.  Psychiatric/Behavioral: Positive for sleep disturbance. Negative for confusion. The patient is nervous/anxious.    All other systems reviewed and are negative for acute change except as noted in the HPI.    Physical Exam Updated Vital Signs BP 121/87 (BP Location: Right Arm)   Pulse 82   Temp 98.6 F (37 C) (Oral)   Resp 14   Ht 5\' 7"  (1.702 m)   Wt 47.2 kg (104 lb)   LMP 12/01/2016 (Exact Date)   SpO2 100%   BMI 16.29 kg/m   Physical Exam  Constitutional: She is oriented to person, place, and  time. Vital signs are normal. She appears well-developed and well-nourished.  Non-toxic appearance. No distress.  Afebrile, nontoxic, NAD although seems anxious. Fairly thin, but not emaciated or malnourished appearing  HENT:  Head: Normocephalic and atraumatic.  Mouth/Throat: Oropharynx is clear and moist and mucous membranes are normal.  Eyes: Conjunctivae and EOM are normal. Right eye exhibits no discharge. Left eye exhibits no discharge.  Neck: Normal range of motion. Neck supple.  Cardiovascular: Normal rate, regular rhythm, normal heart sounds and intact distal pulses.  Exam reveals no gallop and no friction rub.   No murmur heard. RRR, nl s1/s2, no m/r/g, distal pulses intact, no pedal edema   Pulmonary/Chest: Effort normal and breath sounds normal. No respiratory distress. She has no decreased breath sounds. She has no wheezes. She has no rhonchi. She has no  rales.  Abdominal: Soft. Normal appearance and bowel sounds are normal. She exhibits no distension. There is no tenderness. There is no rigidity, no rebound, no guarding, no CVA tenderness, no tenderness at McBurney's point and negative Murphy's sign.  Musculoskeletal: Normal range of motion.  MAE x4 Strength and sensation grossly intact in all extremities Distal pulses intact Gait steady No pedal edema, neg homan's bilaterally   Neurological: She is alert and oriented to person, place, and time. She has normal strength. No sensory deficit.  Skin: Skin is warm, dry and intact. No rash noted.  Psychiatric: Her mood appears anxious.  Anxious appearing  Nursing note and vitals reviewed.    ED Treatments / Results  Labs (all labs ordered are listed, but only abnormal results are displayed) Labs Reviewed  BASIC METABOLIC PANEL - Abnormal; Notable for the following:       Result Value   Glucose, Bld 112 (*)    All other components within normal limits  URINALYSIS, ROUTINE W REFLEX MICROSCOPIC - Abnormal; Notable for the following:    Squamous Epithelial / LPF 0-5 (*)    All other components within normal limits  CBG MONITORING, ED - Abnormal; Notable for the following:    Glucose-Capillary 103 (*)    All other components within normal limits  CBC    EKG  EKG Interpretation  Date/Time:  Sunday December 05 2016 09:53:01 EDT Ventricular Rate:  82 PR Interval:  116 QRS Duration: 82 QT Interval:  376 QTC Calculation: 439 R Axis:   86 Text Interpretation:  Normal sinus rhythm with sinus arrhythmia Normal ECG no acute changes  Confirmed by Liu, Dana ([id number]) on 12/05/2016 11:55:49 AM       Radiology No results found.  Procedures Procedures (including critical care time)  Medications Ordered in ED Medications  sodium chloride 0.9 % bolus 2,000 mL (0 mLs Intravenous Stopped 12/05/16 1412)     Initial Impression / Assessment and Plan / ED Course  I have reviewed the triage vital  signs and the nursing notes.  Pertinent labs & imaging results that were available during my care of the patient were reviewed by me and considered in my medical decision making (see chart for details).     30  y.o. female here with multiple complaints but mostly c/o anxiousness, difficulty sleeping, fatigue, and poor appetite. She had an episode of near syncope last week, which has happened since she was 31y/o, but this is concerning her because she has a small child at home and wants to make sure she's able to take care of him. Went to her PCP last week and had labs that were unremarkable including TSH. Denies  any ongoing lightheadedness or any of the other symptoms that she had last week, only complains of anxiety/sleep issue/fatigue/poor appetite. Physical exam unremarkable aside from being slightly underweight, but not malnourished or emaciated. No tachycardia or hypoxia, no LE swelling. Labs today reassuring, CBC and BMP WNL, U/A unremarkable, EKG unremarkable. Doubt need for further emergent testing, will give 2L IVFs since she's already here, and advised that we could try getting her to use nutritional shake supplements to help with weight and appetite, and use vistaril to help with anxiety and sleep difficulty. Will reassess after fluids finished.   2:15 PM Fluids just now finished, however it seems she pulled her IV out and eloped before I was able to go back into her room and go over d/c instructions again. Previously we had discussed proper nutrition and using shakes to boost appetite and increase weight. Discussed compression stockings and staying hydrated. Discussed F/up with PCP in 1wk for recheck of symptoms. I had written for vistaril rx to help with anxiety and sleep, however pt eloped and so she never received the rx. She seemed to understand the instructions when we had gone over them earlier in the visit, so hopefully she will follow the instructions and f/up as discussed, but  unfortunately she never got the rx. I had explained the diagnosis and had given explicit precautions to return to the ER including for any other new or worsening symptoms. The patient understood and accepted the medical plan as it is dictated and I had answered their questions previously. The patient was STABLE at time of elopement.     Final Clinical Impressions(s) / ED Diagnoses   Final diagnoses:  Other fatigue  Difficulty sleeping  Anxiety  Poor appetite  Low body weight due to inadequate caloric intake    New Prescriptions Discharge Medication List as of 12/05/2016  2:15 PM    START taking these medications   Details  hydrOXYzine (ATARAX/VISTARIL) 25 MG tablet Take 1 tablet (25 mg total) by mouth every 6 (six) hours as needed for anxiety (or difficulty sleeping)., Starting Sun 12/05/2016, [address]Print         Rayhaan Huster, Ephra[place]cedes, [place] 12/05/16 1418    Lavera GuiseLiu, Dana Duo, MD 12/05/16 1900

## 2016-12-05 NOTE — ED Notes (Signed)
Pt CBG was 103, notified Scarlet(RN)

## 2016-12-05 NOTE — ED Triage Notes (Signed)
Pt. Stated, Courtney Atlasve been really tired for the last week with my chest pain. [facility] and [facility] drew blood.

## 2016-12-05 NOTE — ED Notes (Signed)
Pt stated she does not want to get placed on the heart monitor because every time she comes here its the same thing. They tell her nothing is wrong and send her home. Pt just wants them to do a quick test on her chest and let her go.

## 2016-12-05 NOTE — ED Notes (Signed)
Pt pulled IV and left with discharge paperwork. [address], [place] notified.

## 2016-12-07 ENCOUNTER — Encounter: Payer: Self-pay | Admitting: Family Medicine

## 2016-12-13 ENCOUNTER — Telehealth: Payer: Self-pay | Admitting: Family Medicine

## 2016-12-13 NOTE — Telephone Encounter (Signed)
Return to work form dropped off for at front desk for completion.  Patient stated she did not have to fill anything out, it was for the doctor only.  Last DOS/WCC with PCP was 12/01/16.  Placed form in red team folder to be completed by clinical staff.  Lina Sarheryl A Stanley

## 2016-12-14 NOTE — Telephone Encounter (Signed)
Forms placed in PCP box. 

## 2016-12-16 ENCOUNTER — Telehealth: Payer: Self-pay | Admitting: Family Medicine

## 2016-12-16 NOTE — Telephone Encounter (Signed)
Contacted patient regarding return to work form placed in box. Patient not seen since 6/24 syncopal event. Patient was not been seen by me. Called patient's home number without voicemail or answer. No alternative phone number available. Patient needs to schedule appointment for evaluation in order to complete form. Patient also needs to fill out first 2 pages of form prior to evaluation. Form placed at front desk. -- Durward Parcelavid McMullen, DO [facility], PGY-2

## 2016-12-20 ENCOUNTER — Encounter: Payer: Self-pay | Admitting: Family Medicine

## 2016-12-20 ENCOUNTER — Ambulatory Visit: Payer: Medicaid Other | Admitting: Family Medicine

## 2016-12-20 ENCOUNTER — Ambulatory Visit (INDEPENDENT_AMBULATORY_CARE_PROVIDER_SITE_OTHER): Payer: Medicaid Other | Admitting: Family Medicine

## 2016-12-20 ENCOUNTER — Other Ambulatory Visit (HOSPITAL_COMMUNITY)
Admission: RE | Admit: 2016-12-20 | Discharge: 2016-12-20 | Disposition: A | Discharge status: Home / Self Care | Payer: Medicaid Other | Source: Ambulatory Visit | Attending: Family Medicine | Admitting: Family Medicine

## 2016-12-20 VITALS — BP 106/64 | HR 61 | Temp 98.6°F | Ht 67.0 in | Wt 106.0 lb

## 2016-12-20 DIAGNOSIS — Z7251 High risk heterosexual behavior: Secondary | ICD-10-CM

## 2016-12-20 DIAGNOSIS — N760 Acute vaginitis: Secondary | ICD-10-CM | POA: Diagnosis not present

## 2016-12-20 DIAGNOSIS — N898 Other specified noninflammatory disorders of vagina: Secondary | ICD-10-CM

## 2016-12-20 DIAGNOSIS — R399 Unspecified symptoms and signs involving the genitourinary system: Secondary | ICD-10-CM

## 2016-12-20 DIAGNOSIS — B373 Candidiasis of vulva and vagina: Secondary | ICD-10-CM | POA: Diagnosis not present

## 2016-12-20 DIAGNOSIS — B3731 Acute candidiasis of vulva and vagina: Secondary | ICD-10-CM

## 2016-12-20 LAB — POCT URINALYSIS DIP (MANUAL ENTRY)
Bilirubin, UA: NEGATIVE
Blood, UA: NEGATIVE
Glucose, UA: NEGATIVE mg/dL
Nitrite, UA: NEGATIVE
Protein Ur, POC: 100 mg/dL — AB
Spec Grav, UA: >=1.03 — AB (ref 1.010–1.025)
Urobilinogen, UA: 0.2 E.U./dL
pH, UA: 6 (ref 5.0–8.0)

## 2016-12-20 LAB — POCT URINE PREGNANCY: Preg Test, Ur: NEGATIVE

## 2016-12-20 LAB — POCT WET PREP (WET MOUNT)
Clue Cells Wet Prep Whiff POC: NEGATIVE
Trichomonas Wet Prep HPF POC: ABSENT

## 2016-12-20 MED ORDER — FLUCONAZOLE 150 MG PO TABS: 150.0000 mg | ORAL_TABLET | Freq: Once | ORAL | 0 refills | Status: AC

## 2016-12-20 NOTE — Progress Notes (Signed)
    Subjective:  Courtney EmerySophia D Adams is a 31 y.o. female who [facility] with a chief complaint of vaginal complaints  HPI:  Vaginal complaints - Vaginal itching for the past 2 days, burning sensation - Associated with vaginal discharge, yellow in color, foul odor - Has had unprotected intercourse. No birth control, wants condoms.  - No dysuria, hematuria, no urinary sx. No abd pain, n/v. No fever/chills.   ROS: Per HPI  Objective:  Physical Exam: BP 106/64   Pulse 61   Temp 98.6 F (37 C) (Oral)   Ht 5\' 7"  (1.702 m)   Wt 106 lb (48.1 kg)   LMP 12/01/2016 (Exact Date)   SpO2 99%   BMI 16.60 kg/m   Gen: NAD, resting comfortably CV: RRR with no murmurs appreciated Pulm: NWOB, CTAB with no crackles, wheezes, or rhonchi GI: Normal bowel sounds present. Soft, Nontender, Nondistended. No CVA tenderness GU: normal female. whitish thick discharge without odor. Normal rugae. Cervix without lesions. No cervical motion tenderness. Skin: warm, dry Neuro: grossly normal, moves all extremities Psych: Normal affect and thought content  Results for orders placed or performed in visit on 12/20/16 (from the past 72 hour(s))  POCT urinalysis dipstick     Status: Abnormal   Collection Time: 12/20/16  2:07 PM  Result Value Ref Range   Color, UA yellow yellow   Clarity, UA clear clear   Glucose, UA negative negative mg/dL   Bilirubin, UA negative negative   Ketones, POC UA small (15) (A) negative mg/dL   Spec Grav, UA >=1.610>=1.030 (A) 1.010 - 1.025   Blood, UA negative negative   pH, UA 6.0 5.0 - 8.0   Protein Ur, POC =100 (A) negative mg/dL   Urobilinogen, UA 0.2 0.2 or 1.0 E.U./dL   Nitrite, UA Negative Negative   Leukocytes, UA Trace (A) Negative  POCT Wet Prep Mellody Drown(Wet [place])     Status: Abnormal   Collection Time: 12/20/16  2:30 PM  Result Value Ref Range   Source Wet Prep POC VAG    WBC, Wet Prep HPF POC 1-5    Bacteria Wet Prep HPF POC Moderate (A) Few   Clue Cells Wet  Prep HPF POC None None   Clue Cells Wet Prep Whiff POC Negative Whiff    Yeast Wet Prep HPF POC Moderate    Trichomonas Wet Prep HPF POC Absent Absent  POCT urine pregnancy     Status: None   Collection Time: 12/20/16  2:30 PM  Result Value Ref Range   Preg Test, Ur Negative Negative     Assessment/Plan:  Vaginitis and vulvovaginitis Wet prep c/w yeast infection. Does not want to discuss birth control at this time and voices good understanding that condoms help prevent STDs. - Diflucan x1 - Discussed probiotics  - GC/Chlamydia collected today. Patient declined HIV testing.    Leland HerElsia J Yoo, DO PGY-2, [facility] 12/20/2016 2:26 PM

## 2016-12-20 NOTE — Progress Notes (Signed)
vagin

## 2016-12-20 NOTE — Patient Instructions (Addendum)
It was good to meet you today  For your yeast infection, - Take dlifucan - Things that help are probiotics: plain yogurt or supplements  We are checking some labs today, and someone will call you or send you a letter with the results when they are available. Please give us a call if you do not hear from us after 2 weeks.   Take care,  Dr. Leland HerElsia J Yoo, DO [facility]

## 2016-12-20 NOTE — Assessment & Plan Note (Signed)
Wet prep c/w yeast infection. Does not want to discuss birth control at this time and voices good understanding that condoms help prevent STDs. - Diflucan x1 - Discussed probiotics  - GC/Chlamydia collected today. Patient declined HIV testing.

## 2016-12-21 LAB — CERVICOVAGINAL ANCILLARY ONLY
Chlamydia: NEGATIVE
Neisseria Gonorrhea: NEGATIVE

## 2016-12-21 NOTE — Progress Notes (Signed)
Subjective:   Patient ID: Courtney Adams    DOB: 1985/11/26, 31 y.o. female   MRN: [id number]  CC: "STD follow-up"  HPI: Courtney Adams is a 31 y.o. female who presents to clinic today for follow-up after STD check. Problems discussed today are as follows:  STD check: Patient states she was seen 6/9 for STD check. Endorses intercourse with her partner who she is no longer with and did not use a condom. Had a negative pregnancy test and gonorrhea/chlamydia that was treated for yeast infection which has resolved. ROS: Denies vaginal bleeding or discharge, pelvic or abdominal pain, fevers or chills, nausea or vomiting.  Anxiety: Ongoing issue intermittently for 7 years. Patient states anxieties provoked due to every day things including raising 2 children on her own while working 2 to separate jobs. States she lost her best friend 3 months ago. Mother moved in recently but otherwise doing okay however without support system. No history of previous medication or therapy due to anxiety. Does occasionally have a tingling sensation in her fingers with concomitant abdominal pain when she has a panic attack but these are infrequent. Anxiety worse with THC use which is every other day due to exposure with family members. ROS: Denies depressive feelings, SI/HI feelings, palpitations, dyspnea.  Syncopal episode: Was seen 6/24 syncopal episode. Workup noted patient was dehydrated and likely experienced a vagal response. Patient denies recurrence of the syncopal-like episodes. She is currently asking to go back to work as a form to be filled out. ROS: Denies fatigue, feelings of syncope, change in vision, loss of consciousness, palpitations, chest pain, dyspnea.  Complete ROS performed, see HPI for pertinent.  PMFSH: Tobacco use disorder, h/o STD. H/o D&C of uterus. Mother h/o anemia, sister h/o cervical cancer. Smoking status reviewed. Medications reviewed.  Objective:   BP 122/70 (BP Location: Left  Arm, Patient Position: Sitting, Cuff Size: Small)   Pulse 74   Temp 98.5 F (36.9 C) (Oral)   Ht 5\' 7"  (1.702 m)   Wt 105 lb 9.6 oz (47.9 kg)   LMP 12/01/2016 (Exact Date)   SpO2 99%   BMI 16.54 kg/m  Vitals and nursing note reviewed.  General: well nourished, well developed, in no acute distress with non-toxic appearance HEENT: normocephalic, atraumatic, moist mucous membranes CV: regular rate and rhythm without murmurs, rubs, or gallops, no lower extremity edema Lungs: clear to auscultation bilaterally with normal work of breathing Skin: warm, dry, no rashes or lesions, cap refill < 2 seconds Extremities: warm and well perfused, normal tone Psych: euthymic mood congruent affect  Assessment & Plan:   Hypotension, unspecified Acute. Resolved. Suspected to be 2/2 to dehydration and vagal response. BP and HR WNL in office. --Given form to return to work --Discussed red flags and reasons to seek emergent medical care  High risk sexual behavior Had intercourse w/o protection with partner. Had HIV and RPR prior to encounter 2 months ago but could have been exposed. Wet prep c/w Candidiasis which resolved. GC/chlamydia negative. --Will evaluate HIV and RPR today  GAD (generalized anxiety disorder) Chronic. Seems to be multifactorial with single mother raising 2 children and working 2 jobs. No previous history of med therapy. Interested in [facility] integration.  --Pt iterviewed with Gavin Pound, CSW for [facility] counseling and will f/u 1 week --Will give trial of Hydroxyzine 25 mg q6h PRN, 30 tabs, no refills --RTC 2 weeks  Orders Placed This Encounter  Procedures  . HIV antibody (with reflex)  . RPR  Meds ordered this encounter  Medications  . hydrOXYzine (ATARAX/VISTARIL) 25 MG tablet    Sig: Take 1 tablet (25 mg total) by mouth every 6 (six) hours as needed for anxiety (or difficulty sleeping).    Dispense:  30 tablet    Refill:  0    Durward Parcelavid McMullen, DO [facility],  PGY-2 12/22/2016 8:17 PM

## 2016-12-22 ENCOUNTER — Encounter: Payer: Self-pay | Admitting: Family Medicine

## 2016-12-22 ENCOUNTER — Ambulatory Visit (INDEPENDENT_AMBULATORY_CARE_PROVIDER_SITE_OTHER): Payer: Medicaid Other | Admitting: Family Medicine

## 2016-12-22 ENCOUNTER — Encounter: Payer: Self-pay | Admitting: Licensed Clinical Social Worker

## 2016-12-22 ENCOUNTER — Telehealth: Payer: Self-pay | Admitting: Family Medicine

## 2016-12-22 VITALS — BP 122/70 | HR 74 | Temp 98.5°F | Ht 67.0 in | Wt 105.6 lb

## 2016-12-22 DIAGNOSIS — I959 Hypotension, unspecified: Secondary | ICD-10-CM

## 2016-12-22 DIAGNOSIS — F411 Generalized anxiety disorder: Secondary | ICD-10-CM

## 2016-12-22 DIAGNOSIS — Z7251 High risk heterosexual behavior: Secondary | ICD-10-CM

## 2016-12-22 MED ORDER — HYDROXYZINE HCL 25 MG PO TABS: 25.0000 mg | ORAL_TABLET | Freq: Four times a day (QID) | ORAL | 0 refills | Status: DC | PRN

## 2016-12-22 NOTE — Assessment & Plan Note (Addendum)
Had intercourse w/o protection with partner. Had HIV and RPR prior to encounter 2 months ago but could have been exposed. Wet prep c/w Candidiasis which resolved. GC/chlamydia negative. --Will evaluate HIV and RPR today

## 2016-12-22 NOTE — Telephone Encounter (Signed)
Tried to call patient to make f/u appt from today's visit, in two weeks but phone not working.

## 2016-12-22 NOTE — Assessment & Plan Note (Addendum)
Acute. Resolved. Suspected to be 2/2 to dehydration and vagal response. BP and HR WNL in office. --Given form to return to work --Discussed red flags and reasons to seek emergent medical care

## 2016-12-22 NOTE — Patient Instructions (Signed)
Thank you for coming in to see [place] today. Please see below to review our plan for today's visit.  1. I will notify you of your blood results. 2. I have sent in a prescription for hydroxyzine. Below is information on this medication. Take as directed. We will revisit this during her next visit.  Return to clinic in 2 weeks for reassessment of anxiety.  Please call the clinic at [phone] if your symptoms worsen or you have any concerns. It was my pleasure to see you. -- Durward Parcel, DO [facility], PGY-2  Hydroxyzine capsules or tablets What is this medicine? HYDROXYZINE (hye DROX i zeen) is an antihistamine. This medicine is used to treat allergy symptoms. It is also used to treat anxiety and tension. This medicine can be used with other medicines to induce sleep before surgery. This medicine may be used for other purposes; ask your health care provider or pharmacist if you have questions. COMMON BRAND NAME(S): ANX, Atarax, Rezine, Vistaril What should I tell my health care provider before I take this medicine? They need to know if you have any of these conditions: -any chronic illness -difficulty passing urine -glaucoma -heart disease -kidney disease -liver disease -lung disease -an unusual or allergic reaction to hydroxyzine, cetirizine, other medicines, foods, dyes, or preservatives -pregnant or trying to get pregnant -breast-feeding How should I use this medicine? Take this medicine by mouth with a full glass of water. Follow the directions on the prescription label. You may take this medicine with food or on an empty stomach. Take your medicine at regular intervals. Do not take your medicine more often than directed. Talk to your pediatrician regarding the use of this medicine in children. Special care may be needed. While this drug may be prescribed for children as young as 21 years of age for selected conditions, precautions do apply. Patients over 47  years old may have a stronger reaction and need a smaller dose. Overdosage: If you think you have taken too much of this medicine contact a poison control center or emergency room at once. NOTE: This medicine is only for you. Do not share this medicine with others. What if I miss a dose? If you miss a dose, take it as soon as you can. If it is almost time for your next dose, take only that dose. Do not take double or extra doses. What may interact with this medicine? -alcohol -barbiturate medicines for sleep or seizures -medicines for colds, allergies -medicines for depression, anxiety, or emotional disturbances -medicines for pain -medicines for sleep -muscle relaxants This list may not describe all possible interactions. Give your health care provider a list of all the medicines, herbs, non-prescription drugs, or dietary supplements you use. Also tell them if you smoke, drink alcohol, or use illegal drugs. Some items may interact with your medicine. What should I watch for while using this medicine? Tell your doctor or health care professional if your symptoms do not improve. You may get drowsy or dizzy. Do not drive, use machinery, or do anything that needs mental alertness until you know how this medicine affects you. Do not stand or sit up quickly, especially if you are an older patient. This reduces the risk of dizzy or fainting spells. Alcohol may interfere with the effect of this medicine. Avoid alcoholic drinks. Your mouth may get dry. Chewing sugarless gum or sucking hard candy, and drinking plenty of water may help. Contact your doctor if the problem does not go  away or is severe. This medicine may cause dry eyes and blurred vision. If you wear contact lenses you may feel some discomfort. Lubricating drops may help. See your eye doctor if the problem does not go away or is severe. If you are receiving skin tests for allergies, tell your doctor you are using this medicine. What side  effects may I notice from receiving this medicine? Side effects that you should report to your doctor or health care professional as soon as possible: -fast or irregular heartbeat -difficulty passing urine -seizures -slurred speech or confusion -tremor Side effects that usually do not require medical attention (report to your doctor or health care professional if they continue or are bothersome): -constipation -drowsiness -fatigue -headache -stomach upset This list may not describe all possible side effects. Call your doctor for medical advice about side effects. You may report side effects to FDA at 1-800-FDA-1088. Where should I keep my medicine? Keep out of the reach of children. Store at room temperature between 15 and 30 degrees C (59 and 86 degrees F). Keep container tightly closed. Throw away any unused medicine after the expiration date. NOTE: This sheet is a summary. It may not cover all possible information. If you have questions about this medicine, talk to your doctor, pharmacist, or health care provider.  2018 Elsevier/Gold Standard (2007-10-13 14:50:59)

## 2016-12-22 NOTE — Progress Notes (Signed)
  Total time:15 minutes Type of Service: Integrated Behavioral Health- Individual  Interpretor:No. . Interpreter Name and Language:NA  SUBJECTIVE: Courtney Adams is a 31 y.o. female  referred by Dr. Abelardo DieselMcMullen for:  anxiety  Patient reports the following symptoms and or concerns: anxiety and sleep disturbance.  Trouble relaxing, feeling anxious, heart beating fast, breathing getting faster and blurry vision.   Duration of problem:  On and off for the past 7 years, note it has gotten worse over the past 3 months Impact on function: sleep disturbance  Current or Hx of substance use: daily use of cannabis, states would like to quit and is working on this. 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  INTERVENTIONS:,  Mindfulness or Relaxation Training, Supportive Counseling and Psychoeducation and/or Health Education , Reflective listening Standardized Assessments completed: GAD-7=18,Severity: severe.  ASSESSMENT:  Patient currently experiencing anxiety and possible panic attack.  Symptoms exacerbated by the recent death of her friend as well as other daily stressors.  Patient may benefit from, and is in agreement to receive further assessment and brief therapeutic interventions to assist with managing her symptoms.  PLAN: 1. Patient will F/U with LCSW in 1 week 2. LCSW will F/U with phone call in 1 week if patient does not make an appointment 3.  Behavioral recommendations: relaxed breathing, read information provided on fight/flight 4. Take medication as prescribed by PCP 5.Referral:none at this time  Warm Hand Off Completed.     Sammuel Hineseborah Mounir Skipper, LCSW Licensed Clinical Social Worker [facility]   [phone] 12:39 PM

## 2016-12-22 NOTE — Assessment & Plan Note (Addendum)
Chronic. Seems to be multifactorial with single mother raising 2 children and working 2 jobs. No previous history of med therapy. [facility]  --Pt iterviewed with Courtney Adams, [facility] and will f/u 1 week --Will give trial of Hydroxyzine 25 mg q6h PRN, 30 tabs, no refills --RTC 2 weeks

## 2016-12-23 ENCOUNTER — Telehealth: Payer: Self-pay | Admitting: Family Medicine

## 2016-12-23 LAB — HIV ANTIBODY (ROUTINE TESTING W REFLEX): HIV Screen 4th Generation wRfx: NONREACTIVE

## 2016-12-23 LAB — RPR: RPR Ser Ql: NONREACTIVE

## 2016-12-23 NOTE — Telephone Encounter (Signed)
Contacted pt concerning negative HIV and RPR. Pt w/o questions or concerns. -- Durward Parcelavid Quinteria Chisum, DO [facility], PGY-2

## 2016-12-24 ENCOUNTER — Telehealth: Payer: Self-pay | Admitting: Internal Medicine

## 2016-12-24 ENCOUNTER — Other Ambulatory Visit: Payer: Self-pay | Admitting: Family Medicine

## 2016-12-24 DIAGNOSIS — B3731 Acute candidiasis of vulva and vagina: Secondary | ICD-10-CM

## 2016-12-24 DIAGNOSIS — N898 Other specified noninflammatory disorders of vagina: Secondary | ICD-10-CM

## 2016-12-24 DIAGNOSIS — B373 Candidiasis of vulva and vagina: Secondary | ICD-10-CM

## 2016-12-29 ENCOUNTER — Telehealth: Payer: Self-pay | Admitting: Licensed Clinical Social Worker

## 2016-12-29 ENCOUNTER — Telehealth: Payer: Self-pay | Admitting: Family Medicine

## 2016-12-29 ENCOUNTER — Other Ambulatory Visit: Payer: Self-pay | Admitting: Internal Medicine

## 2016-12-29 ENCOUNTER — Other Ambulatory Visit: Payer: Self-pay | Admitting: Family Medicine

## 2016-12-29 DIAGNOSIS — B9689 Other specified bacterial agents as the cause of diseases classified elsewhere: Secondary | ICD-10-CM

## 2016-12-29 DIAGNOSIS — N898 Other specified noninflammatory disorders of vagina: Secondary | ICD-10-CM

## 2016-12-29 DIAGNOSIS — N760 Acute vaginitis: Secondary | ICD-10-CM

## 2016-12-29 MED ORDER — METRONIDAZOLE 0.75 % EX GEL: 1.0000 "application " | Freq: Two times a day (BID) | CUTANEOUS | 0 refills | Status: DC

## 2016-12-29 NOTE — Telephone Encounter (Signed)
Will forward to MD to advise. Kinisha Soper,CMA  

## 2016-12-29 NOTE — Telephone Encounter (Signed)
Pt states she only received one diflucan pill. She still has the BV.  Please advise

## 2016-12-29 NOTE — Progress Notes (Signed)
Integrated Care f/u phone call to patient reference interventions discussed and resources provided during joint visit with PCP.   Patient reports she is upset due to lack of follow-up from her PCP.  States she is concerned with the care she has received and her medical needs are not being met.  Reports she has been inconvienced making two office visits and her medical situation was not resolved.  LCSW allowed patient to vent and share her concerns, assisted with calming patient down and offered to assist her with resolving her concerns. Patient stated she has decided to change practice and has requested to speak with an Scientist, physiological. Information shared with [facility] administrators.    Intervention: Supportive Counseling, Reflective listening and service recovery    [place] Voucher: Previous assistance?  no Taxi, Bus pass, gas card, food, or other?  Med co-pay? no         [place]?  no Other:  [employer] Gift card for service recovery left at front desk for patient to pick up Provider:  Dr. Yisroel [facility] Staff:  Maurine Cane, LCSW   Casimer Lanius, LCSW Licensed Clinical Social Worker [place]   [phone] 10:00 AM .

## 2016-12-29 NOTE — Telephone Encounter (Signed)
Pt is upset. She said she turned in a letter last week stating she wants to change drs.  She is upset because she hasnt been called. She says her labs were messed up.  She had a yeast infection and was told she didn't. She would like to talk to an adminstrator about this

## 2017-01-26 ENCOUNTER — Ambulatory Visit (INDEPENDENT_AMBULATORY_CARE_PROVIDER_SITE_OTHER): Payer: Medicaid Other | Admitting: *Deleted

## 2017-01-26 DIAGNOSIS — Z111 Encounter for screening for respiratory tuberculosis: Secondary | ICD-10-CM | POA: Diagnosis present

## 2017-01-28 ENCOUNTER — Encounter: Payer: Self-pay | Admitting: *Deleted

## 2017-01-28 ENCOUNTER — Ambulatory Visit (INDEPENDENT_AMBULATORY_CARE_PROVIDER_SITE_OTHER): Payer: Medicaid Other | Admitting: *Deleted

## 2017-01-28 DIAGNOSIS — Z111 Encounter for screening for respiratory tuberculosis: Secondary | ICD-10-CM

## 2017-01-28 LAB — TB SKIN TEST
Induration: 0 mm
TB Skin Test: NEGATIVE

## 2017-01-28 NOTE — Progress Notes (Signed)
   PPD Reading Note PPD read and results entered in EpicCare. Result: 0 mm induration. Interpretation: Negative If test not read within 48-72 hours of initial placement, patient advised to repeat in other arm 1-3 weeks after this test. Allergic reaction: no  Martin, Tamika L, RN  

## 2017-02-23 ENCOUNTER — Ambulatory Visit
Admission: RE | Admit: 2017-02-23 | Discharge: 2017-02-23 | Disposition: A | Discharge status: Home / Self Care | Payer: Medicaid Other | Source: Ambulatory Visit | Attending: Family Medicine | Admitting: Family Medicine

## 2017-02-23 ENCOUNTER — Encounter: Payer: Self-pay | Admitting: Internal Medicine

## 2017-02-23 ENCOUNTER — Ambulatory Visit (INDEPENDENT_AMBULATORY_CARE_PROVIDER_SITE_OTHER): Payer: Medicaid Other | Admitting: Internal Medicine

## 2017-02-23 VITALS — BP 100/80 | HR 68 | Temp 98.2°F | Ht 67.0 in | Wt 101.0 lb

## 2017-02-23 DIAGNOSIS — R079 Chest pain, unspecified: Secondary | ICD-10-CM

## 2017-02-23 NOTE — Patient Instructions (Signed)
I have ordered a chest x-ray to make sure your lungs and ribs look ok. I will call you with the results. If you start having productive cough, fevers, cough up blood, have severe chest pain, or significant shortness of breath please seek medical care.   If x-ray is negative this is likely costochondritis which is irritation of the small muscles between the ribs.    Costochondritis Costochondritis is swelling and irritation (inflammation) of the tissue (cartilage) that connects your ribs to your breastbone (sternum). This causes pain in the front of your chest. Usually, the pain:  Starts gradually.  Is in more than one rib.  This condition usually goes away on its own over time. Follow these instructions at home:  Do not do anything that makes your pain worse.  If directed, put ice on the painful area: ? Put ice in a plastic bag. ? Place a towel between your skin and the bag. ? Leave the ice on for 20 minutes, 2-3 times a day.  If directed, put heat on the affected area as often as told by your doctor. Use the heat source that your doctor tells you to use, such as a moist heat pack or a heating pad. ? Place a towel between your skin and the heat source. ? Leave the heat on for 20-30 minutes. ? Take off the heat if your skin turns bright red. This is very important if you cannot feel pain, heat, or cold. You may have a greater risk of getting burned.  Take over-the-counter and prescription medicines only as told by your doctor.  Return to your normal activities as told by your doctor. Ask your doctor what activities are safe for you.  Keep all follow-up visits as told by your doctor. This is important. Contact a doctor if:  You have chills or a fever.  Your pain does not go away or it gets worse.  You have a cough that does not go away. Get help right away if:  You are short of breath. This information is not intended to replace advice given to you by your health care  provider. Make sure you discuss any questions you have with your health care provider. Document Released: 11/17/2007 Document Revised: 12/19/2015 Document Reviewed: 09/24/2015 Elsevier Interactive Patient Education  [employer]2018 Elsevier Inc.

## 2017-02-23 NOTE — Progress Notes (Signed)
   Subjective:    Courtney EmerySophia D Adams - 31 y.o. female MRN [id number]  Date of birth: 08/18/1985  HPI  Courtney Adams is here for chest pain.  Chest Pain: Reports left sided chest pain over the lower ribes on the anterior portion of her chest wall. Has been present for about one week. Describes pain as sharp, pulling pain. Pain does not radiate. She quit smoking 3 days ago and feels like this has helped. Was initially having pain with inspiration but that has resolved. Endorses new non-productive cough. Denies hemoptysis. She denies SOB or respiratory distress. Denies nausea, vomiting, diaphoresis. Denies lower extremity edema or calf pain. No history of VTE. No history of underlying lung disease. No known injuries to the area.   -  reports that she quit smoking 3 days ago. Her smoking use included Cigarettes. She has never used smokeless tobacco. - Review of Systems: Per HPI. - Past Medical History: Patient Active Problem List   Diagnosis Date Noted  . GAD (generalized anxiety disorder) 12/22/2016  . Tobacco abuse 11/20/2015  . Contact dermatitis 01/22/2014  . Occipital lymphadenopathy 01/22/2014  . Hypotension, unspecified 01/22/2014  . High risk sexual behavior 10/19/2013  . Mood disorder (HCC) 06/19/2012  . Substance abuse 06/19/2012   - Medications: reviewed and updated   Objective:   Physical Exam BP 100/80   Pulse 68   Temp 98.2 F (36.8 C) (Oral)   Ht 5\' 7"  (1.702 m)   Wt 101 lb (45.8 kg)   LMP 02/20/2017   SpO2 98%   Breastfeeding? No   BMI 15.82 kg/m  Gen: NAD, alert, cooperative with exam, well-appearing CV: RRR, good S1/S2, no murmur, no LE edema, capillary refill brisk, negative Homan's sign bilaterally, pain reproducible with palpation over lower anterior left ribs  Resp: CTABL, no wheezes, non-labored    Assessment & Plan:   1. Chest pain, unspecified type Differential remains broad at present. Low suspicion for CAP given lack of hypoxia, afebrile, and clear  lung exam. Low suspicion for PE given Well's Score of 0 and normal vital signs. However, patient is at higher risk for VTE given substantial smoking history. Will evaluate with d-dimer. If positive, would order CTA. Will obtain CXR to further evaluate lungs and ribs. Doubt ACS given lack of typical chest pain and significant risk factors.  At this point, most suspicious for costochondritis given reproducibility on exam and location of pain. Discussed conservative measures for treatment. Will follow up results obtained from today. Return precautions discussed.     - DG Chest 2 View; Future - D-dimer, quantitative ([facility])  Marcy Sirenatherine Wallace, D.O. 02/23/2017, 2:56 PM PGY-3, [facility]

## 2017-02-24 ENCOUNTER — Telehealth: Payer: Self-pay | Admitting: *Deleted

## 2017-02-24 LAB — D-DIMER, QUANTITATIVE: D-DIMER: 0.28 mg/L FEU (ref 0.00–0.49)

## 2017-02-24 NOTE — Telephone Encounter (Signed)
Patient called requesting chest x-ray results from yesterday. Please give her a call. Number on file is correct.  Clovis PuMartin, Caitlinn Klinker L, RN

## 2017-02-28 ENCOUNTER — Telehealth: Payer: Self-pay | Admitting: Family Medicine

## 2017-02-28 NOTE — Telephone Encounter (Signed)
LVM for pt to call the office. Sharon T Saunders, CMA  

## 2017-02-28 NOTE — Telephone Encounter (Signed)
-----   Message from Arvilla Market, DO sent at 02/24/2017 10:48 AM EDT ----- Please call patient to let her know that CXR did not show any acute abnormalities such as pneumonia, broken ribs, etc. It did show some hyperinflation of her lungs which is probably related to chronic changes from smoking. Her d-dimer was negative so I am not concerned for a blood clot causing her symptoms. She should treat this like we discussed as muscle strain between her ribs with NSAIDs, ice, heat, etc. She should follow up with her PCP.   Of note, PCP may want to consider PFTs if she has not had them previously due to hyperinflation on imaging and substantial smoking history. Reviewed prior CXRs and hyperinflation has been present for some time.    Marcy Siren, D.O. 02/24/2017, 10:46 AM PGY-3, [facility]

## 2017-02-28 NOTE — Telephone Encounter (Signed)
error 

## 2017-02-28 NOTE — Telephone Encounter (Signed)
-----   Message from Catherine Lauren Wallace, DO sent at 02/24/2017 10:48 AM EDT ----- Please call patient to let her know that CXR did not show any acute abnormalities such as pneumonia, broken ribs, etc. It did show some hyperinflation of her lungs which is probably related to chronic changes from smoking. Her d-dimer was negative so I am not concerned for a blood clot causing her symptoms. She should treat this like we discussed as muscle strain between her ribs with NSAIDs, ice, heat, etc. She should follow up with her PCP.   Of note, PCP may want to consider PFTs if she has not had them previously due to hyperinflation on imaging and substantial smoking history. Reviewed prior CXRs and hyperinflation has been present for some time.    Catherine Wallace, D.O. 02/24/2017, 10:46 AM PGY-3, [facility] 

## 2017-02-28 NOTE — Telephone Encounter (Signed)
Contacted pt regarding normal D-dimer after appt for CP. Reassured pt likely MSK related. Discussed red flags. Pt w/o questions or concerns. -- Durward Parcel, DO [facility], PGY-2

## 2017-03-02 NOTE — Telephone Encounter (Signed)
Left message for patient to call back  

## 2017-04-14 ENCOUNTER — Ambulatory Visit (INDEPENDENT_AMBULATORY_CARE_PROVIDER_SITE_OTHER): Payer: Self-pay | Admitting: Student

## 2017-04-14 ENCOUNTER — Encounter: Payer: Self-pay | Admitting: Student

## 2017-04-14 VITALS — BP 90/62 | HR 84 | Temp 98.9°F | Ht 67.0 in | Wt 105.4 lb

## 2017-04-14 DIAGNOSIS — R0981 Nasal congestion: Secondary | ICD-10-CM

## 2017-04-14 DIAGNOSIS — J3489 Other specified disorders of nose and nasal sinuses: Secondary | ICD-10-CM

## 2017-04-14 DIAGNOSIS — F411 Generalized anxiety disorder: Secondary | ICD-10-CM

## 2017-04-14 MED ORDER — FLUTICASONE PROPIONATE 50 MCG/ACT NA SUSP: 2.0000 | Freq: Every day | NASAL | 6 refills | Status: DC

## 2017-04-14 MED ORDER — AMOXICILLIN-POT CLAVULANATE 875-125 MG PO TABS: 1.0000 | ORAL_TABLET | Freq: Two times a day (BID) | ORAL | 0 refills | Status: DC

## 2017-04-14 MED ORDER — HYDROXYZINE HCL 25 MG PO TABS: 25.0000 mg | ORAL_TABLET | Freq: Four times a day (QID) | ORAL | 0 refills | Status: DC | PRN

## 2017-04-14 NOTE — Progress Notes (Signed)
Subjective:    Jeanette CapriceSophia is a 31 y.o. old female here "sinus infection".  She is here with her children.  HPI Sinus infection: this has been going on for one week. She reports soreness in her left nose and over her left cheek bone. Denies runny nose but congestion. She reports clearing her throat a lot. No itchy eyes, sneezing, fever, chills, shortness of breath, or chest pain. Never had soreness like this before but had history of "stuffy nose".  Denies nausea or vomiting. Didn't have a flu shot this years.   Significant past medical history: Denies history of allergy. No history of asthma  Significant social history: admits smoking cig 0.5 pack a day for 21 years. Smokes THC half a "blunt" every other day. Denies EtOH or other recreational drugs.  PMH/Problem List: has Mood disorder (HCC); Substance abuse (HCC); High risk sexual behavior; Contact dermatitis; Occipital lymphadenopathy; Hypotension, unspecified; Tobacco abuse; and GAD (generalized anxiety disorder) on her problem list.   has a past medical history of Abortion; Anemia; Anxiety; Anxiety; Bipolar 1 disorder (HCC); BV (bacterial vaginosis); Chlamydia; Gonorrhea; Infection; Preterm delivery; Preterm labor; and Trichomonas.  FH:  Family History  Problem Relation Age of Onset  . Anemia Mother   . Cervical cancer Sister   . Diabetes Maternal Grandmother   . Heart disease Paternal Grandmother   . Heart disease Paternal Grandfather   . Stroke Paternal Uncle     SH Social History  Substance Use Topics  . Smoking status: Former Smoker    Types: Cigarettes    Quit date: 02/20/2017  . Smokeless tobacco: Never Used  . Alcohol use No    Review of Systems Review of systems negative except for pertinent positives and negatives in history of present illness above.     Objective:     Vitals:   04/14/17 1453  BP: 90/62  Pulse: 84  Temp: 98.9 F (37.2 C)  TempSrc: Oral  SpO2: 97%  Weight: 105 lb 6.4 oz (47.8 kg)  Height:  5\' 7"  (1.702 m)   Body mass index is 16.51 kg/m.  Physical Exam GEN: appears well, no apparent distress. Head: normocephalic and atraumatic.  Tenderness to palpation over left maxillary sinus.  Eyes: conjunctiva without injection, sclera anicteric Ears: Pain over left maxillary areas with gentle tugging on ear pinna, ear canals and TM's within normal limits Nares: Erythema and swelling of inferior turbinate in her left naris. Oropharynx: mmm without erythema or exudation.  Normal transillumination test.  HEM: negative for cervical or periauricular lymphadenopathies ENDO: negative thyromegally CVS: RRR, nl S1&S2, no murmurs, no edema RESP: no IWOB, good air movement bilaterally, CTAB MSK: no focal tenderness or notable swelling SKIN: no apparent skin lesion PSYCH: euthymic mood with congruent affect    Assessment and Plan:  1. Sinus pain/Nasal congestion: Congestion, postnasal dripping and erythema of inferior turbinate into the left naris are concerning for allergic rhinitis.  However, she denies itching, sneezing or runny nose.  Patient is also tender to palpation over her left maxillary sinus concerning for sinusitis.  She has symptoms for 7 days.  However, a transillumination test is negative.  Otherwise, patient is well-appearing.  Given a prescription for fluticasone nasal spray.  We have discussed about the proper way of using this medication.  I also gave her a paper prescription for Augmentin in case her symptoms will not improve or if she develops fever or worsening. Advised her to quit smoking but she is not ready yet.  2. GAD (  generalized anxiety disorder): Stable. Patient requests a refill on hydroxyzine.  Refill hydroxyzine.  Warned about the side effects including sedation and drowsiness.   Patient declined flu vaccine today.   Almon Hercules, MD 04/14/17 Pager: [phone]

## 2017-04-14 NOTE — Patient Instructions (Addendum)
It was great seeing you today! We have addressed the following issues today  1. Nasal congestion/pain: I have sent a prescription for Flonase nasal spray to your pharmacy.  Please use the nasal spray as we discussed.  If no improvement in your symptoms, you can fill the prescription for antibiotic and start taking. 2. Anxiety: I have refilled the hydroxyzine today.  Please be cautious if you are driving or doing something that needs mental concentration.  If we did any lab work today, and the results require attention, either me or my nurse will get in touch with you. If everything is normal, you will get a letter in mail and a message via . If you don't hear from us in two weeks, please give us a call. Otherwise, we look forward to seeing you again at your next visit. If you have any questions or concerns before then, please call the clinic at [phone].  Please bring all your medications to every doctors visit  Sign up for My Chart to have easy access to your labs results, and communication with your Primary care physician.    Please check-out at the front desk before leaving the clinic.    Take Care,   Dr. Alanda SlimGonfa

## 2017-04-28 ENCOUNTER — Other Ambulatory Visit: Payer: Self-pay | Admitting: Family Medicine

## 2017-04-28 ENCOUNTER — Ambulatory Visit: Payer: Medicaid Other | Admitting: Family Medicine

## 2017-04-28 DIAGNOSIS — N898 Other specified noninflammatory disorders of vagina: Secondary | ICD-10-CM

## 2017-05-25 ENCOUNTER — Ambulatory Visit (INDEPENDENT_AMBULATORY_CARE_PROVIDER_SITE_OTHER): Payer: Self-pay | Admitting: Family Medicine

## 2017-05-25 ENCOUNTER — Other Ambulatory Visit: Payer: Self-pay

## 2017-05-25 ENCOUNTER — Encounter: Payer: Self-pay | Admitting: Family Medicine

## 2017-05-25 VITALS — BP 106/60 | HR 78 | Temp 98.6°F | Ht 67.0 in | Wt 107.0 lb

## 2017-05-25 DIAGNOSIS — N760 Acute vaginitis: Secondary | ICD-10-CM

## 2017-05-25 DIAGNOSIS — B9689 Other specified bacterial agents as the cause of diseases classified elsewhere: Secondary | ICD-10-CM

## 2017-05-25 DIAGNOSIS — F1210 Cannabis abuse, uncomplicated: Secondary | ICD-10-CM

## 2017-05-25 DIAGNOSIS — Z3169 Encounter for other general counseling and advice on procreation: Secondary | ICD-10-CM

## 2017-05-25 DIAGNOSIS — Z3202 Encounter for pregnancy test, result negative: Secondary | ICD-10-CM

## 2017-05-25 DIAGNOSIS — F17200 Nicotine dependence, unspecified, uncomplicated: Secondary | ICD-10-CM

## 2017-05-25 LAB — POCT WET PREP (WET MOUNT)
Clue Cells Wet Prep Whiff POC: NEGATIVE
Trichomonas Wet Prep HPF POC: ABSENT

## 2017-05-25 LAB — POCT URINE PREGNANCY: Preg Test, Ur: NEGATIVE

## 2017-05-25 MED ORDER — PRENATAL 19 PO CHEW: 1.0000 | CHEWABLE_TABLET | Freq: Every day | ORAL | 3 refills | Status: DC

## 2017-05-25 NOTE — Patient Instructions (Signed)
Thank you for coming in to see us today. Please see below to review our plan for today's visit.  1.  I have sent in for prenatal vitamin.  Take this daily while you are sexually active planning for pregnancy. 2.  If your BV symptoms return, we should consider doing the topical metronidazole despite being to pay out-of-pocket. 3.  It is important that you stop smoking both cigarettes and marijuana, especially if you are planning to get pregnant.  You can call 1-800-QUIT-NOW for free resources.  Please call the clinic at [phone] if your symptoms worsen or you have any concerns. It was our pleasure to serve you.  Durward Parcelavid Estephan Gallardo, DO [facility], PGY-2

## 2017-05-25 NOTE — Assessment & Plan Note (Addendum)
Chronic.  Every other day user. - Encourage patient to stop

## 2017-05-25 NOTE — Assessment & Plan Note (Addendum)
Sexually active.  Last LMP 05/11/17 with 14-day cycles, now due.  Pregnancy test negative. - Given prescription for prenatal vitamins - Given condoms discussed safe sex practice

## 2017-05-25 NOTE — Progress Notes (Signed)
   Subjective   Patient ID: Courtney EmerySophia D Adams    DOB: 10/15/1985, 31 y.o. female   MRN: [id number]  CC: "Recheck BV"  HPI: Courtney EulerSophia D Kizzie Adams is a 31 y.o. female who presents to clinic today for the following:  Bacterial vaginosis: Has history of recurrent BV. Onset 2 weeks ago with yellow vaginal discharge with fish-like odor. Complete course of oral flagyl 1 weeks ago. Discharge has significantly improved. She denies fevers or chills, nausea or vomiting, vaginal bleeding, pruritis.   Preconception counseling: Sexually active with 1 female partner last 3 months. Does not think she is pregnant. Not taking birth control. Interested in getting condoms but wanting to get pregnant. Asking for prenatal-vitamin.  Smoking: Current every day smioker. Cigs 1/2 ppd 3-4 times per week. Also using marijuana every other day.  ROS: see HPI for pertinent.  PMFSH: Tobacco use disorder, h/o STD. Surgical history D&C of uterus. Family history anemia, cervical cancer. Smoking status reviewed. Medications reviewed.  Objective   BP 106/60   Pulse 78   Temp 98.6 F (37 C) (Oral)   Ht 5\' 7"  (1.702 m)   Wt 107 lb (48.5 kg)   LMP 05/10/2017   SpO2 99%   Breastfeeding? No   BMI 16.76 kg/m  Vitals and nursing note reviewed.  General: well nourished, well developed, NAD with non-toxic appearance HEENT: normocephalic, atraumatic, moist mucous membranes Cardiovascular: regular rate and rhythm without murmurs, rubs, or gallops Lungs: clear to auscultation bilaterally with normal work of breathing Abdomen: soft, non-tender, non-distended, normoactive bowel sounds GU: accompanied by chaperone, no external lesions or rash, normal vaginal rugae, cervical loss appreciated without friability, no adnexal tenderness, absent odor or discharge Skin: warm, dry, no rashes or lesions, cap refill < 2 seconds Extremities: warm and well perfused, normal tone, no edema  Assessment & Plan   Encounter for preconception  consultation Sexually active.  Last LMP 05/11/17 with 14-day cycles, now due.  Pregnancy test negative. - Given prescription for prenatal vitamins - Given condoms discussed safe sex practice  Tobacco use disorder Chronic.  Current everyday smoker.  Interested in cessation.  Patient would like to proceed with cutting out cigarettes cold [place] without use of medications or supplements.  She is interested in pregnant and smoked during her last pregnancy. - Given smoking cessation counseling  Marijuana abuse Chronic.  Every other day user. - Encourage patient to stop  Bacterial vaginosis Chronic.  History of recurrence with symptoms consistent with BV.  Completed course of Flagyl.  Negative wet prep. - Discussed with patient consideration for metronidazole gel if BV returns  Orders Placed This Encounter  Procedures  . POCT Wet Prep [employer](Wet [place])  . POCT urine pregnancy   Meds ordered this encounter  Medications  . Prenatal Vit-Fe Fumarate-FA (PRENATAL 19) tablet    Sig: Chew 1 tablet by mouth daily.    Dispense:  90 tablet    Refill:  3    Durward Parcelavid Laporscha Linehan, DO [facility], PGY-2 05/25/2017, 11:14 AM

## 2017-05-25 NOTE — Assessment & Plan Note (Addendum)
Chronic.  History of recurrence with symptoms consistent with BV.  Completed course of Flagyl.  Negative wet prep. - Discussed with patient consideration for metronidazole gel if BV returns

## 2017-05-25 NOTE — Assessment & Plan Note (Addendum)
Chronic.  Current everyday smoker.  Interested in cessation.  Patient would like to proceed with cutting out cigarettes cold [place] without use of medications or supplements.  She is interested in pregnant and smoked during her last pregnancy. - Given smoking cessation counseling

## 2017-05-27 ENCOUNTER — Telehealth: Payer: Self-pay | Admitting: Family Medicine

## 2017-05-27 NOTE — Telephone Encounter (Signed)
Pt would like a prenatal pill called into her pharmacy that medicaid will cover. The one that was just called in is not covered. Please advise

## 2017-05-27 NOTE — Telephone Encounter (Signed)
Will forward to MD to advise. Jazmin Hartsell,CMA  

## 2017-05-30 NOTE — Telephone Encounter (Signed)
Is there a prenatal covered by Medicaid? I believe she may have to buy this OTC.

## 2017-06-02 NOTE — Telephone Encounter (Signed)
I asked dr Doroteo Glassmanphelps about prenatals and she said she always sends in the generic and it covers it. Awab Abebe Bruna PotterBlount, CMA

## 2017-06-03 ENCOUNTER — Other Ambulatory Visit: Payer: Self-pay | Admitting: Family Medicine

## 2017-06-03 MED ORDER — PRENATAL VITAMINS 0.8 MG PO TABS: 1.0000 | ORAL_TABLET | Freq: Every day | ORAL | 3 refills | Status: DC

## 2017-06-03 NOTE — Telephone Encounter (Signed)
LMOVM informing pt that her rx was sent to the pharmacy. Deseree Bruna PotterBlount, CMA

## 2017-06-03 NOTE — Telephone Encounter (Signed)
Generic sent in to pharmacy. Please let patient know. Thank you.

## 2017-06-09 ENCOUNTER — Ambulatory Visit (INDEPENDENT_AMBULATORY_CARE_PROVIDER_SITE_OTHER): Payer: Medicaid Other | Admitting: Family Medicine

## 2017-06-09 ENCOUNTER — Encounter: Payer: Self-pay | Admitting: Family Medicine

## 2017-06-09 ENCOUNTER — Other Ambulatory Visit: Payer: Self-pay

## 2017-06-09 DIAGNOSIS — R079 Chest pain, unspecified: Secondary | ICD-10-CM | POA: Diagnosis present

## 2017-06-09 MED ORDER — OMEPRAZOLE 20 MG PO CPDR: 20.0000 mg | DELAYED_RELEASE_CAPSULE | Freq: Every day | ORAL | 1 refills | Status: DC

## 2017-06-09 NOTE — Progress Notes (Signed)
   Subjective:   Patient ID: Courtney EmerySophia D Adams    DOB: 03/15/1986, [age] y.o. female   MRN: [id number]  CC: chest pain  HPI: Courtney Adams is a 31 y.o. female who presents to clinic today for chest pain.  Chest pain Symptoms began about 2 weeks ago, however she has previously had similar symptoms in September and was seen by Dr. Earlene PlaterWallace.  At that time, thought likely musculoskeletal in nature.  In the past she has also been told that her anxiety may be playing a role.  She takes Hydroxyzine for this.  Chest pain is L-sided along her anterior ribs, is constant, does not radiate, and is sharp/stabbing in nature.  She describes it as 9/10 in severity.  Worse with deep inspiration and better once she falls asleep. She denies SOB, sweating, dizziness, palpitations.  She is concerned she is having a heart attack. She cannot delineate if she is stressed out but does report she sometimes worries about finances and her bills.  She smokes cigarettes, about 1/2 pack every other day, is not on OCPs, and no recent history of long drives or air travel.  She has not been lifting heavy weights but does carry her 2 sons around.    Social: current smoker  ROS: Denies fever, chills, nausea, vomiting, abdominal pain.  Denies shortness of breath, palpitations, leg swelling.  PMFSH: Pertinent past medical, surgical, family, and social history were reviewed and updated as appropriate. Smoking status reviewed. Medications reviewed.  Objective:   BP 92/64   Pulse 70   Temp 98.1 F (36.7 C) (Oral)   Ht 5\' 7"  (1.702 m)   Wt 106 lb 3.2 oz (48.2 kg)   LMP 06/04/2017 (Approximate)   SpO2 99%   BMI 16.63 kg/m  Vitals and nursing note reviewed.  General: 31 yo female, thin, NAD, appears comfortable  HEENT: EOMI, MMM, o/p clear  Neck: supple, nontender CV: RRR no MRG, chest pain is reproducible with palpation over L chest wall  Lungs: CTAB, no wheeze, non-laboured, normal effort   Abdomen: soft, NTND, +bs  Skin:  warm, dry, no rash Extremities: warm and well perfused, normal tone  Assessment & Plan:   Chest pain Symptoms likely consistent with MSK etiology given she carries her sons around and chest pain is reproducible on exam.    Unlikely pneumonia given lack of associated symptoms, afebrile and lung exam is clear.   Low suspicion for PE given Well's score 0 with normal vital signs. Workup in September with negative CXR and d-dimer within normal limit.  Will likely not need to repeat workup as recently performed.  Pt also endorsing she is unsure if this is heartburn - may trial antacid to see if symptoms improve with this.  -Would recommend Aleve BID x 7 days -Rx Prilosec 20 mg daily -Strict return precautions discussed and pt expressed good understanding   Meds ordered this encounter  Medications  . omeprazole (PRILOSEC) 20 MG capsule    Sig: Take 1 capsule (20 mg total) by mouth daily.    Dispense:  30 capsule    Refill:  1    Freddrick MarchYashika Cordelia Bessinger, MD [facility], PGY-2 06/09/2017 3:31 PM

## 2017-06-09 NOTE — Assessment & Plan Note (Addendum)
Symptoms likely consistent with MSK etiology given she carries her sons around and chest pain is reproducible on exam.    Unlikely pneumonia given lack of associated symptoms, afebrile and lung exam is clear.   Low suspicion for PE given Well's score 0 with normal vital signs. Workup in September with negative CXR and d-dimer within normal limit.  Will likely not need to repeat workup as recently performed.  Pt also endorsing she is unsure if this is heartburn - may trial antacid to see if symptoms improve with this.  -Would recommend Aleve BID x 7 days -Rx Prilosec 20 mg daily -Strict return precautions discussed and pt expressed good understanding

## 2017-06-09 NOTE — Patient Instructions (Signed)
It was nice meeting you today -- your symptoms of chest pain is most likely due to the muscle overlying your chest wall which may have been strained.  I would recommend taking Aleve twice daily to see if this helps.  Additionally, you can try a heating pad and warm showers.  As we discussed, some of your symptoms could also be related to heartburn.  I have prescribed you some Omeprazole to take once a day for a month.  If your symptoms improve with this, it is more likely that that was the cause.   If you have any new or worsening symptoms, I would like for you to come in and be seen again.  I hope you start to feel better soon!   Be well, Courtney MarchYashika Tiyah Zelenak, MD

## 2017-06-20 ENCOUNTER — Emergency Department (HOSPITAL_COMMUNITY): Payer: Medicaid Other

## 2017-06-20 ENCOUNTER — Other Ambulatory Visit: Payer: Self-pay

## 2017-06-20 ENCOUNTER — Encounter (HOSPITAL_COMMUNITY): Payer: Self-pay | Admitting: *Deleted

## 2017-06-20 DIAGNOSIS — R079 Chest pain, unspecified: Secondary | ICD-10-CM | POA: Diagnosis present

## 2017-06-20 DIAGNOSIS — Z5321 Procedure and treatment not carried out due to patient leaving prior to being seen by health care provider: Secondary | ICD-10-CM | POA: Diagnosis not present

## 2017-06-20 LAB — BASIC METABOLIC PANEL
Anion gap: 5 (ref 5–15)
BUN: 14 mg/dL (ref 6–20)
CO2: 26 mmol/L (ref 22–32)
Calcium: 9.3 mg/dL (ref 8.9–10.3)
Chloride: 103 mmol/L (ref 101–111)
Creatinine, Ser: 0.75 mg/dL (ref 0.44–1.00)
GFR calc Af Amer: >60 mL/min (ref >60)
GFR calc non Af Amer: >60 mL/min (ref >60)
Glucose, Bld: 85 mg/dL (ref 65–99)
Potassium: 3.7 mmol/L (ref 3.5–5.1)
Sodium: 134 mmol/L — ABNORMAL LOW (ref 135–145)

## 2017-06-20 LAB — I-STAT BETA HCG BLOOD, ED (MC, WL, AP ONLY): I-stat hCG, quantitative: <5 m[IU]/mL (ref <5)

## 2017-06-20 LAB — CBC
HCT: 41.1 % (ref 36.0–46.0)
Hemoglobin: 13.3 g/dL (ref 12.0–15.0)
MCH: 31.7 pg (ref 26.0–34.0)
MCHC: 32.4 g/dL (ref 30.0–36.0)
MCV: 98.1 fL (ref 78.0–100.0)
Platelets: 179 10*3/uL (ref 150–400)
RBC: 4.19 MIL/uL (ref 3.87–5.11)
RDW: 12.3 % (ref 11.5–15.5)
WBC: 4.2 10*3/uL (ref 4.0–10.5)

## 2017-06-20 LAB — I-STAT TROPONIN, ED: Troponin i, poc: 0 ng/mL (ref 0.00–0.08)

## 2017-06-20 NOTE — ED Triage Notes (Signed)
Pt c/o L sided chest pain x 2 weeks with increased sob on exertion and decreased appetite. Pt has seen her PCP and given medications for acid reflux. Pt is a smoker, stopped for 2 days to see if that help but denies improvement

## 2017-06-21 ENCOUNTER — Emergency Department (HOSPITAL_COMMUNITY)
Admission: EM | Admit: 2017-06-21 | Discharge: 2017-06-21 | Disposition: A | Discharge status: Home / Self Care | Payer: Medicaid Other | Attending: Emergency Medicine | Admitting: Emergency Medicine

## 2017-06-21 NOTE — ED Notes (Signed)
Pt called to be roomed with no answer.  

## 2017-06-21 NOTE — ED Notes (Signed)
Pt called for vitals with no answer.  

## 2017-06-23 ENCOUNTER — Ambulatory Visit (INDEPENDENT_AMBULATORY_CARE_PROVIDER_SITE_OTHER): Payer: Medicaid Other | Admitting: Internal Medicine

## 2017-06-23 ENCOUNTER — Encounter: Payer: Self-pay | Admitting: Internal Medicine

## 2017-06-23 VITALS — BP 100/60 | HR 60 | Temp 101.0°F | Wt 100.8 lb

## 2017-06-23 DIAGNOSIS — R5382 Chronic fatigue, unspecified: Secondary | ICD-10-CM | POA: Diagnosis present

## 2017-06-23 DIAGNOSIS — R0789 Other chest pain: Secondary | ICD-10-CM

## 2017-06-23 MED ORDER — MELOXICAM 7.5 MG PO TABS: 7.5000 mg | ORAL_TABLET | Freq: Every day | ORAL | 0 refills | Status: DC

## 2017-06-23 NOTE — Patient Instructions (Signed)
It was nice meeting you today Courtney Adams!  For chest pain, please take one tablet of Mobic (meloxicam) daily as needed. Do not take more than one tablet per day. Do not take ibuprofen, naproxen, or medications containing ibuprofen or naproxen when you are taking Mobic. It is safe to take Tylenol and Mobic at the same time.   Be sure you are drinking plenty of fluids, preferably water. Even if you are not eating normally, it is important to drink a lot to stay well-hydrated.   If your chest pain gets worse, if the pain starts to radiate down your arm or to your back, if you have trouble breathing with chest pain, or if you feel like your heart is beating very quickly, please call our office or go to the emergency room.   If you have any questions or concerns, please feel free to call the clinic.   Be well,  Dr. Natale MilchLancaster

## 2017-06-23 NOTE — Assessment & Plan Note (Signed)
Multiple encounters for same issue now. CXR neg x2, most recently two days ago. D-dimer neg. No relief with omeprazole, however did not take for very long. No relief with discontinuation of smoking, however only quit for two days. Again reproducible on exam. Agree with previous providers that MSK etiology is most likely. Given location of pain (extreme R lateral chest wall near R axilla), doubt pericarditis. Most consistent in nature with costochondritis, however location is not consistent.  As patient now reporting significant fatigue as well as generalized body aches, fibromyalgia is on differential. Body aches with associated cough certainly more consistent with URI, however will continue to consider fibromyalgia if no improvement.  Will treat conservatively with Mobic for now (patient convinced regular ibuprofen makes her too tired and does not want to continue taking this) for likely MSK etiology. Will also obtain Vit D and TSH levels today to rule out these possible causes of fatigue. Strict return precautions discussed. Do not feel that further imaging is necessary at this time. F/u PRN.

## 2017-06-23 NOTE — Progress Notes (Signed)
Subjective:   Patient: Courtney Adams       Birthdate: 11/26/1985       MRN: [id number]      HPI  Lousie D Kizzie BaneHughes is a 32 y.o. female presenting for same day visit for chest pain.   Chest pain Ongoing issue since at least Sept. Has been seen by Dr. Earlene PlaterWallace for this in Sept - CXR and D-dimer were NL and pain was reproducible on exam, so diagnosed with MSK etiology. Seen by Dr. Nelson ChimesAmin for this two weeks ago; still reproducible on exam, so likely MSK, but also prescribed omeprazole in case pain associated with GERD. Patient says this was not effective. Patient went to ED two nights ago. CXR performed at that time showed no abnormalities. She did not stay long enough to be seen by provider. Says that since then chest pain has continued. Is located primarily under R axilla, primarily over her ribs. Pain does not radiate. Says that pain is worse with movement. Was told before that she has an irregular heartbeat so is unsure if this is associated with that or not. Denies palpitations, racing heart. Does not take any medication for pain, including no Tylenol or ibuprofen. Stopped smoking for 2 days as she thought this might help, but it did not. Is now smoking daily again.  Patient also endorses body aches that began one week ago and sore throat beginning yesterday. Took ibuprofen for cough which made her sleepy. Has not taken anything. Also with decreased appetite. Also reporting significant fatigue.   Smoking status reviewed. Patient is current every day smoker.   Review of Systems See HPI.     Objective:  Physical Exam  Constitutional: She is oriented to person, place, and time and well-developed, well-nourished, and in no distress.  HENT:  Head: Normocephalic and atraumatic.  Nose: Nose normal.  Mouth/Throat: Oropharynx is clear and moist. No oropharyngeal exudate.  Cardiovascular: Normal rate, regular rhythm and normal heart sounds.  No murmur heard. Pulmonary/Chest: Effort normal and breath  sounds normal. No respiratory distress. She has no wheezes. She has no rales.  Musculoskeletal:  TTP of R lateral chest wall. No TTP of other areas of chest wall.   Neurological: She is alert and oriented to person, place, and time.  Skin: Skin is warm and dry.  Psychiatric:  Flat affect      Assessment & Plan:  Chest pain Multiple encounters for same issue now. CXR neg x2, most recently two days ago. D-dimer neg. No relief with omeprazole, however did not take for very long. No relief with discontinuation of smoking, however only quit for two days. Again reproducible on exam. Agree with previous providers that MSK etiology is most likely. Given location of pain (extreme R lateral chest wall near R axilla), doubt pericarditis. Most consistent in nature with costochondritis, however location is not consistent.  As patient now reporting significant fatigue as well as generalized body aches, fibromyalgia is on differential. Body aches with associated cough certainly more consistent with URI, however will continue to consider fibromyalgia if no improvement.  Will treat conservatively with Mobic for now (patient convinced regular ibuprofen makes her too tired and does not want to continue taking this) for likely MSK etiology. Will also obtain Vit D and TSH levels today to rule out these possible causes of fatigue. Strict return precautions discussed. Do not feel that further imaging is necessary at this time. F/u PRN.   Tarri AbernethyAbigail J Ladarien Beeks, MD, MPH PGY-3 [facility] Pager [phone]

## 2017-06-24 LAB — VITAMIN D 25 HYDROXY (VIT D DEFICIENCY, FRACTURES): Vit D, 25-Hydroxy: 35.8 ng/mL (ref 30.0–100.0)

## 2017-06-24 LAB — TSH: TSH: 0.192 u[IU]/mL — ABNORMAL LOW (ref 0.450–4.500)

## 2017-06-27 ENCOUNTER — Other Ambulatory Visit: Payer: Self-pay | Admitting: Family Medicine

## 2017-06-27 ENCOUNTER — Telehealth: Payer: Self-pay | Admitting: Internal Medicine

## 2017-06-27 DIAGNOSIS — N760 Acute vaginitis: Secondary | ICD-10-CM

## 2017-06-27 DIAGNOSIS — B9689 Other specified bacterial agents as the cause of diseases classified elsewhere: Secondary | ICD-10-CM

## 2017-06-27 NOTE — Telephone Encounter (Signed)
Pt [facility] [address]

## 2017-06-27 NOTE — Telephone Encounter (Signed)
Called patient regarding low TSH. No answer, but left voicemail. Was normal at 0.7 six months ago, but decreased to 0.19 when checked last week after patient reporting fatigue. Vit D also checked, which was WNL. TSH could be altered due to recent illness. Encouraged patient to schedule f/u appt in about 6 weeks to recheck TSH to make sure this has normalized. Would also recommend ordering T3 and T4 at that time for more thorough evaluation, as well as CBC to check for anemia as another possible cause of patient's fatigue. Encouraged patient to call with any questions.   Tarri AbernethyAbigail J Donna Silverman, MD, MPH PGY-3 [facility] Pager [phone]

## 2017-06-28 ENCOUNTER — Telehealth: Payer: Self-pay | Admitting: *Deleted

## 2017-06-28 MED ORDER — METRONIDAZOLE 0.75 % VA GEL: 1.0000 | Freq: Two times a day (BID) | VAGINAL | 0 refills | Status: DC

## 2017-06-28 MED ORDER — METRONIDAZOLE 0.75 % EX GEL: 1.0000 "application " | Freq: Two times a day (BID) | CUTANEOUS | 0 refills | Status: DC

## 2017-06-28 NOTE — Telephone Encounter (Signed)
Patient calling due to [facility] not being covered by [facility].  Spoke with pharmacist and Rx sigs for topical for BV.  Topical requires PA.  Will cancel Rx and resubmit new metronidazole gel for vaginal use for BID due to dx for BV.

## 2017-07-07 ENCOUNTER — Ambulatory Visit (HOSPITAL_COMMUNITY)
Admission: EM | Admit: 2017-07-07 | Discharge: 2017-07-07 | Disposition: A | Discharge status: Home / Self Care | Payer: Medicaid Other | Attending: Family Medicine | Admitting: Family Medicine

## 2017-07-07 ENCOUNTER — Encounter (HOSPITAL_COMMUNITY): Payer: Self-pay | Admitting: Family Medicine

## 2017-07-07 DIAGNOSIS — J0100 Acute maxillary sinusitis, unspecified: Secondary | ICD-10-CM

## 2017-07-07 MED ORDER — AMOXICILLIN-POT CLAVULANATE 875-125 MG PO TABS: 1.0000 | ORAL_TABLET | Freq: Two times a day (BID) | ORAL | 0 refills | Status: DC

## 2017-07-07 NOTE — ED Triage Notes (Signed)
Pt here for 3 weeks of cough and not better. sts fatigue, loss of appetite. Taken all of the medicines her PCP prescribed but not better.

## 2017-07-08 ENCOUNTER — Encounter (HOSPITAL_COMMUNITY): Payer: Self-pay

## 2017-07-08 ENCOUNTER — Other Ambulatory Visit: Payer: Self-pay

## 2017-07-08 ENCOUNTER — Emergency Department (HOSPITAL_COMMUNITY): Payer: Medicaid Other

## 2017-07-08 DIAGNOSIS — R079 Chest pain, unspecified: Secondary | ICD-10-CM | POA: Diagnosis not present

## 2017-07-08 DIAGNOSIS — R05 Cough: Secondary | ICD-10-CM | POA: Diagnosis not present

## 2017-07-08 DIAGNOSIS — M6281 Muscle weakness (generalized): Secondary | ICD-10-CM | POA: Diagnosis present

## 2017-07-08 DIAGNOSIS — R630 Anorexia: Secondary | ICD-10-CM | POA: Insufficient documentation

## 2017-07-08 DIAGNOSIS — Z5321 Procedure and treatment not carried out due to patient leaving prior to being seen by health care provider: Secondary | ICD-10-CM | POA: Insufficient documentation

## 2017-07-08 LAB — BASIC METABOLIC PANEL
Anion gap: 10 (ref 5–15)
BUN: 8 mg/dL (ref 6–20)
CO2: 26 mmol/L (ref 22–32)
Calcium: 9.1 mg/dL (ref 8.9–10.3)
Chloride: 98 mmol/L — ABNORMAL LOW (ref 101–111)
Creatinine, Ser: 0.68 mg/dL (ref 0.44–1.00)
GFR calc Af Amer: >60 mL/min (ref >60)
GFR calc non Af Amer: >60 mL/min (ref >60)
Glucose, Bld: 105 mg/dL — ABNORMAL HIGH (ref 65–99)
Potassium: 3.1 mmol/L — ABNORMAL LOW (ref 3.5–5.1)
Sodium: 134 mmol/L — ABNORMAL LOW (ref 135–145)

## 2017-07-08 LAB — I-STAT BETA HCG BLOOD, ED (MC, WL, AP ONLY): I-stat hCG, quantitative: <5 m[IU]/mL (ref <5)

## 2017-07-08 LAB — CBC
HCT: 34.8 % — ABNORMAL LOW (ref 36.0–46.0)
Hemoglobin: 11.8 g/dL — ABNORMAL LOW (ref 12.0–15.0)
MCH: 32.3 pg (ref 26.0–34.0)
MCHC: 33.9 g/dL (ref 30.0–36.0)
MCV: 95.3 fL (ref 78.0–100.0)
Platelets: 308 10*3/uL (ref 150–400)
RBC: 3.65 MIL/uL — ABNORMAL LOW (ref 3.87–5.11)
RDW: 11.7 % (ref 11.5–15.5)
WBC: 10.6 10*3/uL — ABNORMAL HIGH (ref 4.0–10.5)

## 2017-07-08 LAB — I-STAT TROPONIN, ED: Troponin i, poc: 0.01 ng/mL (ref 0.00–0.08)

## 2017-07-08 MED ORDER — ACETAMINOPHEN 325 MG PO TABS
650.0000 mg | ORAL_TABLET | Freq: Once | ORAL | Status: AC | PRN
  Administered 2017-07-08: 650 mg via ORAL
  Filled 2017-07-08: qty 2

## 2017-07-08 NOTE — ED Triage Notes (Signed)
Pt states that for the past several weeks she has been feeling weak, loss of appetite, CP and a cough, [facility]. States she feels like she is getting shocked in her chest, SOB with radiation to neck

## 2017-07-08 NOTE — ED Notes (Signed)
Pt remains in waiting room. Updated on wait for treatment room. 

## 2017-07-09 ENCOUNTER — Emergency Department (HOSPITAL_COMMUNITY)
Admission: EM | Admit: 2017-07-09 | Discharge: 2017-07-09 | Disposition: A | Discharge status: Home / Self Care | Payer: Medicaid Other | Attending: Emergency Medicine | Admitting: Emergency Medicine

## 2017-07-09 NOTE — ED Notes (Signed)
Pt's child has been discharged.  Called for pt x3 for a room with no answer

## 2017-07-11 ENCOUNTER — Other Ambulatory Visit: Payer: Self-pay

## 2017-07-11 ENCOUNTER — Emergency Department (HOSPITAL_COMMUNITY)
Admission: EM | Admit: 2017-07-11 | Discharge: 2017-07-11 | Disposition: A | Discharge status: Home / Self Care | Payer: Medicaid Other | Attending: Emergency Medicine | Admitting: Emergency Medicine

## 2017-07-11 ENCOUNTER — Encounter (HOSPITAL_COMMUNITY): Payer: Self-pay | Admitting: *Deleted

## 2017-07-11 ENCOUNTER — Emergency Department (HOSPITAL_COMMUNITY): Payer: Medicaid Other

## 2017-07-11 DIAGNOSIS — R079 Chest pain, unspecified: Secondary | ICD-10-CM | POA: Insufficient documentation

## 2017-07-11 DIAGNOSIS — R0981 Nasal congestion: Secondary | ICD-10-CM | POA: Insufficient documentation

## 2017-07-11 DIAGNOSIS — Z79899 Other long term (current) drug therapy: Secondary | ICD-10-CM | POA: Insufficient documentation

## 2017-07-11 DIAGNOSIS — J189 Pneumonia, unspecified organism: Secondary | ICD-10-CM | POA: Diagnosis not present

## 2017-07-11 DIAGNOSIS — Z87891 Personal history of nicotine dependence: Secondary | ICD-10-CM | POA: Insufficient documentation

## 2017-07-11 DIAGNOSIS — R05 Cough: Secondary | ICD-10-CM | POA: Diagnosis present

## 2017-07-11 DIAGNOSIS — R0989 Other specified symptoms and signs involving the circulatory and respiratory systems: Secondary | ICD-10-CM | POA: Diagnosis not present

## 2017-07-11 LAB — CBC
HCT: 35.6 % — ABNORMAL LOW (ref 36.0–46.0)
Hemoglobin: 11.6 g/dL — ABNORMAL LOW (ref 12.0–15.0)
MCH: 31.4 pg (ref 26.0–34.0)
MCHC: 32.6 g/dL (ref 30.0–36.0)
MCV: 96.5 fL (ref 78.0–100.0)
Platelets: 339 10*3/uL (ref 150–400)
RBC: 3.69 MIL/uL — ABNORMAL LOW (ref 3.87–5.11)
RDW: 11.9 % (ref 11.5–15.5)
WBC: 3.5 10*3/uL — ABNORMAL LOW (ref 4.0–10.5)

## 2017-07-11 LAB — I-STAT BETA HCG BLOOD, ED (MC, WL, AP ONLY): I-stat hCG, quantitative: <5 m[IU]/mL (ref <5)

## 2017-07-11 LAB — BASIC METABOLIC PANEL
Anion gap: 11 (ref 5–15)
BUN: 9 mg/dL (ref 6–20)
CO2: 26 mmol/L (ref 22–32)
Calcium: 9.1 mg/dL (ref 8.9–10.3)
Chloride: 101 mmol/L (ref 101–111)
Creatinine, Ser: 0.67 mg/dL (ref 0.44–1.00)
GFR calc Af Amer: >60 mL/min (ref >60)
GFR calc non Af Amer: >60 mL/min (ref >60)
Glucose, Bld: 91 mg/dL (ref 65–99)
Potassium: 3.3 mmol/L — ABNORMAL LOW (ref 3.5–5.1)
Sodium: 138 mmol/L (ref 135–145)

## 2017-07-11 LAB — URINALYSIS, ROUTINE W REFLEX MICROSCOPIC
Bilirubin Urine: NEGATIVE
Glucose, UA: NEGATIVE mg/dL
Hgb urine dipstick: NEGATIVE
Ketones, ur: NEGATIVE mg/dL
Leukocytes, UA: NEGATIVE
Nitrite: NEGATIVE
Protein, ur: NEGATIVE mg/dL
Specific Gravity, Urine: 1.026 (ref 1.005–1.030)
pH: 6 (ref 5.0–8.0)

## 2017-07-11 LAB — CBG MONITORING, ED: Glucose-Capillary: 67 mg/dL (ref 65–99)

## 2017-07-11 MED ORDER — AZITHROMYCIN 250 MG PO TABS: 250.0000 mg | ORAL_TABLET | Freq: Every day | ORAL | 0 refills | Status: DC

## 2017-07-11 MED ORDER — POTASSIUM CHLORIDE CRYS ER 20 MEQ PO TBCR
40.0000 meq | EXTENDED_RELEASE_TABLET | Freq: Once | ORAL | Status: AC
  Administered 2017-07-11: 40 meq via ORAL
  Filled 2017-07-11: qty 2

## 2017-07-11 MED ORDER — AZITHROMYCIN 250 MG PO TABS
500.0000 mg | ORAL_TABLET | Freq: Once | ORAL | Status: AC
Start: 2017-07-11 — End: 2017-07-11
  Administered 2017-07-11: 500 mg via ORAL
  Filled 2017-07-11: qty 2

## 2017-07-11 NOTE — ED Provider Notes (Signed)
[facility] EMERGENCY DEPARTMENT Provider Note   CSN: [id number] Arrival date & time: 07/11/17  1142     History   Chief Complaint No chief complaint on file.   HPI Courtney Adams is a 32 y.o. female.  Patient c/o recent cough, occasional prod small amt yellow phlegm. Episodic, persistent, moderate. No sore throat. Recent sinus congestion/pain - was given abx as outpt. Pt indicates took part of the rx, and nasal symptoms resolved, but cough persists, although improving. +fever. No headache. No neck pain or stiffness. No nvd. Denies chest pain or sob.    The history is provided by the patient.    Past Medical History:  Diagnosis Date  . Abortion   . Anemia   . Anxiety   . Anxiety   . Bipolar 1 disorder (HCC)   . BV (bacterial vaginosis)   . Chlamydia   . Gonorrhea   . Infection    UTI  . Preterm delivery   . Preterm labor   . Trichomonas     Patient Active Problem List   Diagnosis Date Noted  . Chest pain 06/09/2017  . GAD (generalized anxiety disorder) 12/22/2016  . Tobacco use disorder 11/20/2015  . Occipital lymphadenopathy 01/22/2014  . Hypotension, unspecified 01/22/2014  . High risk sexual behavior 10/19/2013  . Bacterial vaginosis 06/06/2013  . Mood disorder (HCC) 06/19/2012  . Marijuana abuse 06/19/2012  . Encounter for preconception consultation 06/19/2012    Past Surgical History:  Procedure Laterality Date  . DILATION AND CURETTAGE OF UTERUS      OB History    Gravida Para Term Preterm AB Living   5 2 1 1 3 1    SAB TAB Ectopic Multiple Live Births     3   0 1      Obstetric Comments   - Reportedly had PTL, advised bedrest at 6 months, advised no sexual activity but continued and thinks this may have contributed to pre-term delivery. Delivery NSVD at [facility] - NICU for 16 days       Home Medications    Prior to Admission medications   Medication Sig Start Date End Date Taking? Authorizing Provider    amoxicillin-clavulanate (AUGMENTIN) 875-125 MG tablet Take 1 tablet by mouth every 12 (twelve) hours. 07/07/17   Mardella Layman, MD  fluticasone (FLONASE) 50 MCG/ACT nasal spray Place 2 sprays into both nostrils daily. Patient not taking: Reported on 05/25/2017 04/14/17   Almon Hercules, MD  meloxicam (MOBIC) 7.5 MG tablet Take 1 tablet (7.5 mg total) by mouth daily. 06/23/17   Marquette Saa, MD  metroNIDAZOLE (METROGEL) 0.75 % vaginal gel Place 1 Applicatorful vaginally 2 (two) times daily. 06/28/17   Wendee Beavers, DO  omeprazole (PRILOSEC) 20 MG capsule Take 1 capsule (20 mg total) by mouth daily. 06/09/17   Freddrick March, MD  Prenatal Multivit-Min-Fe-FA (PRENATAL VITAMINS) 0.8 MG tablet Take 1 tablet by mouth daily. 06/03/17   Wendee Beavers, DO    Family History Family History  Problem Relation Age of Onset  . Anemia Mother   . Cervical cancer Sister   . Diabetes Maternal Grandmother   . Heart disease Paternal Grandmother   . Heart disease Paternal Grandfather   . Stroke Paternal Uncle     Social History Social History   Tobacco Use  . Smoking status: Former Smoker    Types: Cigarettes    Last attempt to quit: 02/20/2017    Years since quitting: 0.3  .  Smokeless tobacco: Never Used  Substance Use Topics  . Alcohol use: No    Alcohol/week: 0.0 oz  . Drug use: No     Allergies   Patient has no known allergies.   Review of Systems Review of Systems  Constitutional: Positive for fever.  HENT: Negative for sore throat.   Eyes: Negative for redness.  Respiratory: Positive for cough. Negative for shortness of breath.   Cardiovascular: Negative for chest pain.  Gastrointestinal: Negative for abdominal pain and vomiting.  Genitourinary: Negative for flank pain.  Musculoskeletal: Negative for neck pain and neck stiffness.  Skin: Negative for rash.  Neurological: Negative for headaches.  Hematological: Does not bruise/bleed easily.  Psychiatric/Behavioral:  Negative for confusion.     Physical Exam Updated Vital Signs BP 113/63 (BP Location: Right Arm)   Pulse 70   Temp 98 F (36.7 C) (Oral)   Resp 18   LMP 07/02/2017 (Exact Date)   SpO2 100%   Physical Exam  Constitutional: She appears well-developed and well-nourished. No distress.  HENT:  Mouth/Throat: Oropharynx is clear and moist.  Eyes: Conjunctivae are normal. No scleral icterus.  Neck: Neck supple. No tracheal deviation present.  No stiffness/rigidity.   Cardiovascular: Normal rate, regular rhythm, normal heart sounds and intact distal pulses. Exam reveals no gallop and no friction rub.  No murmur heard. Pulmonary/Chest: Effort normal. No stridor. No respiratory distress.  Rhonchi right lower.   Abdominal: Normal appearance. She exhibits no distension.  Musculoskeletal: She exhibits no edema.  Neurological: She is alert.  Skin: Skin is warm and dry. No rash noted. She is not diaphoretic.  Psychiatric: She has a normal mood and affect.  Nursing note and vitals reviewed.    ED Treatments / Results  Labs (all labs ordered are listed, but only abnormal results are displayed) Results for orders placed or performed during the hospital encounter of 07/11/17  Basic metabolic panel  Result Value Ref Range   Sodium 138 135 - 145 mmol/L   Potassium 3.3 (L) 3.5 - 5.1 mmol/L   Chloride 101 101 - 111 mmol/L   CO2 26 22 - 32 mmol/L   Glucose, Bld 91 65 - 99 mg/dL   BUN 9 6 - 20 mg/dL   Creatinine, Ser 0.45 0.44 - 1.00 mg/dL   Calcium 9.1 8.9 - 40.9 mg/dL   GFR calc non Af Amer >60 >60 mL/min   GFR calc Af Amer >60 >60 mL/min   Anion gap 11 5 - 15  CBC  Result Value Ref Range   WBC 3.5 (L) 4.0 - 10.5 K/uL   RBC 3.69 (L) 3.87 - 5.11 MIL/uL   Hemoglobin 11.6 (L) 12.0 - 15.0 g/dL   HCT 81.1 (L) 91.4 - 78.2 %   MCV 96.5 78.0 - 100.0 fL   MCH 31.4 26.0 - 34.0 pg   MCHC 32.6 30.0 - 36.0 g/dL   RDW 95.6 21.3 - 08.6 %   Platelets 339 150 - 400 K/uL  Urinalysis, Routine w  reflex microscopic  Result Value Ref Range   Color, Urine YELLOW YELLOW   APPearance HAZY (A) CLEAR   Specific Gravity, Urine 1.026 1.005 - 1.030   pH 6.0 5.0 - 8.0   Glucose, UA NEGATIVE NEGATIVE mg/dL   Hgb urine dipstick NEGATIVE NEGATIVE   Bilirubin Urine NEGATIVE NEGATIVE   Ketones, ur NEGATIVE NEGATIVE mg/dL   Protein, ur NEGATIVE NEGATIVE mg/dL   Nitrite NEGATIVE NEGATIVE   Leukocytes, UA NEGATIVE NEGATIVE  CBG monitoring, ED  Result Value Ref Range   Glucose-Capillary 67 65 - 99 mg/dL  I-Stat beta hCG blood, ED  Result Value Ref Range   I-stat hCG, quantitative <5.0 <5 mIU/mL   Comment 3           Dg Chest 2 View  Result Date: 07/11/2017 CLINICAL DATA:  Chest pain. EXAM: CHEST  2 VIEW COMPARISON:  Radiographs of July 08, 2017. FINDINGS: The heart size and mediastinal contours are within normal limits. No pneumothorax or pleural effusion is noted. Left lung is clear. Stable right lower lobe opacity is noted consistent with pneumonia. The visualized skeletal structures are unremarkable. IMPRESSION: Stable right lower lobe opacity is noted consistent with pneumonia. Electronically Signed   By: Lupita RaiderJames  Green Jr, M.D.   On: 07/11/2017 12:54   Dg Chest 2 View  Result Date: 07/08/2017 CLINICAL DATA:  Chest pain and cough. Shortness of breath. Fever and tachycardia. EXAM: CHEST  2 VIEW COMPARISON:  06/20/2017 FINDINGS: Airspace disease in the right lower lobe. In this setting this is most consistent with pneumonia. No visible cavitation or significant effusion. Normal heart size. Artifact from EKG leads. IMPRESSION: Right lower lobe pneumonia. Electronically Signed   By: Marnee SpringJonathon  Watts M.D.   On: 07/08/2017 20:35   Dg Chest 2 View  Result Date: 06/20/2017 CLINICAL DATA:  Generalized chest pain. EXAM: CHEST  2 VIEW COMPARISON:  02/24/2017 FINDINGS: Cardiomediastinal silhouette is normal. Mediastinal contours appear intact. There is no evidence of focal airspace consolidation, pleural  effusion or pneumothorax. Osseous structures are without acute abnormality. Soft tissues are grossly normal. IMPRESSION: No active cardiopulmonary disease. Electronically Signed   By: Ted Mcalpineobrinka  Dimitrova M.D.   On: 06/20/2017 21:22    EKG  EKG Interpretation None       Radiology Dg Chest 2 View  Result Date: 07/11/2017 CLINICAL DATA:  Chest pain. EXAM: CHEST  2 VIEW COMPARISON:  Radiographs of July 08, 2017. FINDINGS: The heart size and mediastinal contours are within normal limits. No pneumothorax or pleural effusion is noted. Left lung is clear. Stable right lower lobe opacity is noted consistent with pneumonia. The visualized skeletal structures are unremarkable. IMPRESSION: Stable right lower lobe opacity is noted consistent with pneumonia. Electronically Signed   By: Lupita RaiderJames  Green Jr, M.D.   On: 07/11/2017 12:54    Procedures Procedures (including critical care time)  Medications Ordered in ED Medications - No data to display   Initial Impression / Assessment and Plan / ED Course  I have reviewed the triage vital signs and the nursing notes.  Pertinent labs & imaging results that were available during my care of the patient were reviewed by me and considered in my medical decision making (see chart for details).  Reviewed nursing notes and prior charts for additional history.   Chest xray reviewed - c/w pna.  Pt took some of her augmentin but some days left.  Is start to feel improved but  Fever/cough not resolved.   Will cover w zithromax, and instruct to complete course of her abx.   Patient breathing comfortably and appears stable for d/c.  kcl po also given.     Final Clinical Impressions(s) / ED Diagnoses   Final diagnoses:  None    ED Discharge Orders    None       Cathren LaineSteinl, Farris Geiman, MD 07/11/17 1610

## 2017-07-11 NOTE — ED Triage Notes (Signed)
Pt was seen here on 1/26.  Pt states she is faint, not eating, and chest pain.  Pt got her reports that showed she has some type of pneumonia.  She had to leave the day she came and has not seen a doctor.  She feels like her heart is all cluttered up.

## 2017-07-11 NOTE — ED Notes (Signed)
Patient transported to X-ray 

## 2017-07-11 NOTE — ED Notes (Signed)
CBG taken at 12:05 was 67.

## 2017-07-11 NOTE — Discharge Instructions (Signed)
It was our pleasure to provide your ER care today - we hope that you feel better.  Take zithromax as prescribed. Complete the course of your prior antibiotic.   Follow up with primary care doctor in the next few weeks to make sure antibiotic treatment clears your pneumonia.   From today's labs, your potassium level is mildly low (3.3) - eat plenty of fruits and vegetables, and follow up with primary care doctor in 1 week.   Return to ER if worse, increased trouble breathing, other concern.

## 2017-07-11 NOTE — ED Notes (Signed)
Pt stable, ambulatory, and verbalizes understanding of d/c instructions.  

## 2017-07-12 NOTE — ED Provider Notes (Signed)
[facility]   [id number] 07/07/17 Arrival Time: 1241  ASSESSMENT & PLAN:  1. Acute non-recurrent maxillary sinusitis     Meds ordered this encounter  Medications  . amoxicillin-clavulanate (AUGMENTIN) 875-125 MG tablet    Sig: Take 1 tablet by mouth every 12 (twelve) hours.    Dispense:  20 tablet    Refill:  0   Discussed typical duration of symptoms. OTC symptom care as needed. Ensure adequate fluid intake and rest. May f/u with PCP or here as needed.  Reviewed expectations re: course of current medical issues. Questions answered. Outlined signs and symptoms indicating need for more acute intervention. Patient verbalized understanding. After Visit Summary given.   SUBJECTIVE: History from: patient.  Courtney Adams is a 32 y.o. female who presents with complaint of nasal congestion, post-nasal drainage, and a persistent dry cough. Mainly persistent sinus pressure bothering her the most. Other symptoms are decreasing in severity. Onset abrupt, approximately 2-3 weeks ago. Overall fatigued with body aches. SOB: none. Wheezing: none. Fever: no. Overall normal PO intake without n/v. Sick contacts: no. OTC treatment: cold meds without much help.  Social History   Tobacco Use  Smoking Status Former Smoker  . Types: Cigarettes  . Last attempt to quit: 02/20/2017  . Years since quitting: 0.3  Smokeless Tobacco Never Used    ROS: As per HPI.   OBJECTIVE:  Vitals:   07/07/17 1300  BP: 101/67  Pulse: 97  Resp: 18  Temp: 98.7 F (37.1 C)  SpO2: 100%     General appearance: alert; appears fatigued HEENT: nasal congestion; clear runny nose; throat irritation secondary to post-nasal drainage; bilateral maxillary sinus tenderness to palpation Neck: supple without LAD Lungs: unlabored respirations, symmetrical air entry; cough: mild; no respiratory distress Skin: warm and dry Psychological: alert and cooperative; normal mood and affect  No Known  Allergies  Past Medical History:  Diagnosis Date  . Abortion   . Anemia   . Anxiety   . Anxiety   . Bipolar 1 disorder (HCC)   . BV (bacterial vaginosis)   . Chlamydia   . Gonorrhea   . Infection    UTI  . Preterm delivery   . Preterm labor   . Trichomonas    Family History  Problem Relation Age of Onset  . Anemia Mother   . Cervical cancer Sister   . Diabetes Maternal Grandmother   . Heart disease Paternal Grandmother   . Heart disease Paternal Grandfather   . Stroke Paternal Uncle    Social History   Socioeconomic History  . Marital status: Single    Spouse name: Not on file  . Number of children: Not on file  . Years of education: Not on file  . Highest education level: Not on file  Social Needs  . Financial resource strain: Not on file  . Food insecurity - worry: Not on file  . Food insecurity - inability: Not on file  . Transportation needs - medical: Not on file  . Transportation needs - non-medical: Not on file  Occupational History  . Not on file  Tobacco Use  . Smoking status: Former Smoker    Types: Cigarettes    Last attempt to quit: 02/20/2017    Years since quitting: 0.3  . Smokeless tobacco: Never Used  Substance and Sexual Activity  . Alcohol use: No    Alcohol/week: 0.0 oz  . Drug use: No  . Sexual activity: Yes    Partners: Male  Other  Topics Concern  . Not on file  Social History Narrative  . Not on file           Mardella LaymanHagler, Ema Hebner, MD 07/12/17 [phone]

## 2017-08-17 ENCOUNTER — Telehealth: Payer: Self-pay

## 2017-08-17 NOTE — Telephone Encounter (Signed)
Pt called nurse line to ask if she has ever had a tetanus shot. PT advised she did have a Tdap 06/19/12 per immunization record. Shawna OrleansMeredith B Thomsen, RN

## 2018-01-19 ENCOUNTER — Other Ambulatory Visit: Payer: Self-pay

## 2018-01-19 ENCOUNTER — Encounter: Payer: Self-pay | Admitting: Obstetrics

## 2018-01-19 ENCOUNTER — Other Ambulatory Visit (HOSPITAL_COMMUNITY)
Admission: RE | Admit: 2018-01-19 | Discharge: 2018-01-19 | Disposition: A | Discharge status: Home / Self Care | Payer: Medicaid Other | Source: Ambulatory Visit | Attending: Obstetrics | Admitting: Obstetrics

## 2018-01-19 ENCOUNTER — Ambulatory Visit (INDEPENDENT_AMBULATORY_CARE_PROVIDER_SITE_OTHER): Payer: Medicaid Other | Admitting: Obstetrics

## 2018-01-19 ENCOUNTER — Other Ambulatory Visit: Payer: Self-pay | Admitting: Obstetrics

## 2018-01-19 VITALS — BP 106/66 | HR 88 | Temp 98.5°F | Wt 115.3 lb

## 2018-01-19 DIAGNOSIS — Z3480 Encounter for supervision of other normal pregnancy, unspecified trimester: Secondary | ICD-10-CM | POA: Diagnosis present

## 2018-01-19 DIAGNOSIS — O26899 Other specified pregnancy related conditions, unspecified trimester: Secondary | ICD-10-CM

## 2018-01-19 DIAGNOSIS — Z3481 Encounter for supervision of other normal pregnancy, first trimester: Secondary | ICD-10-CM

## 2018-01-19 DIAGNOSIS — N898 Other specified noninflammatory disorders of vagina: Secondary | ICD-10-CM

## 2018-01-19 DIAGNOSIS — O26891 Other specified pregnancy related conditions, first trimester: Secondary | ICD-10-CM

## 2018-01-19 DIAGNOSIS — O219 Vomiting of pregnancy, unspecified: Secondary | ICD-10-CM

## 2018-01-19 MED ORDER — DOXYLAMINE-PYRIDOXINE ER 20-20 MG PO TBCR: 1.0000 | EXTENDED_RELEASE_TABLET | Freq: Two times a day (BID) | ORAL | 5 refills | Status: DC

## 2018-01-19 MED ORDER — DOXYLAMINE-PYRIDOXINE 10-10 MG PO TBEC: DELAYED_RELEASE_TABLET | ORAL | 5 refills | Status: DC

## 2018-01-19 MED ORDER — VITAFOL FE+ 90-1-200 & 50 MG PO CPPK: 2.0000 | ORAL_CAPSULE | Freq: Every day | ORAL | 11 refills | Status: DC

## 2018-01-19 NOTE — Progress Notes (Signed)
Subjective:    Courtney EmerySophia D Adams is being seen today for her first obstetrical visit.  This is not a planned pregnancy. She is at 7636w2d gestation. Her obstetrical history is significant for NONE. Relationship with FOB: UNKNOWN. Patient does intend to breast feed. Pregnancy history fully reviewed.  The information documented in the HPI was reviewed and verified.  Menstrual History: OB History    Gravida  6   Para  2   Term  1   Preterm  1   AB  3   Living  2     SAB      TAB  3   Ectopic      Multiple  0   Live Births  1        Obstetric Comments  - Reportedly had PTL, advised bedrest at 6 months, advised no sexual activity but continued and thinks this may have contributed to pre-term delivery. [facility]         Patient's last menstrual period was 11/22/2017 (exact date).    Past Medical History:  Diagnosis Date  . Abortion   . Anemia   . Anxiety   . Anxiety   . Bipolar 1 disorder (HCC)   . BV (bacterial vaginosis)   . Chlamydia   . Gonorrhea   . Headache   . Infection    UTI  . Pneumonia   . Preterm delivery   . Preterm labor   . Trichomonas   . Vaginal Pap smear, abnormal     Past Surgical History:  Procedure Laterality Date  . DILATION AND CURETTAGE OF UTERUS       (Not in a hospital admission) No Known Allergies  Social History   Tobacco Use  . Smoking status: Former Smoker    Types: Cigarettes    Last attempt to quit: 02/20/2017    Years since quitting: 0.9  . Smokeless tobacco: Never Used  Substance Use Topics  . Alcohol use: No    Alcohol/week: 0.0 standard drinks    Family History  Problem Relation Age of Onset  . Anemia Mother   . Varicose Veins Mother   . Cervical cancer Sister   . Cancer Sister   . Diabetes Maternal Grandmother   . Heart disease Paternal Grandmother   . Heart disease Paternal Grandfather   . Stroke Paternal Grandfather   . Stroke Paternal Uncle      Review of  Systems Constitutional: negative for weight loss Gastrointestinal: negative for vomiting Genitourinary:negative for genital lesions and vaginal discharge and dysuria Musculoskeletal:negative for back pain Behavioral/Psych: negative for abusive relationship, depression, illegal drug usage and tobacco use    Objective:    BP 106/66   Pulse 88   Temp 98.5 F (36.9 C)   Wt 115 lb 4.8 oz (52.3 kg)   LMP 11/22/2017 (Exact Date)   BMI 18.06 kg/m  General Appearance:    Alert, cooperative, no distress, appears stated age  Head:    Normocephalic, without obvious abnormality, atraumatic  Eyes:    PERRL, conjunctiva/corneas clear, EOM's intact, fundi    benign, both eyes  Ears:    Normal TM's and external ear canals, both ears  Nose:   Nares normal, septum midline, mucosa normal, no drainage    or sinus tenderness  Throat:   Lips, mucosa, and tongue normal; teeth and gums normal  Neck:   Supple, symmetrical, trachea midline, no adenopathy;    thyroid:  no enlargement/tenderness/nodules; no carotid   bruit or JVD  Back:     Symmetric, no curvature, ROM normal, no CVA tenderness  Lungs:     Clear to auscultation bilaterally, respirations unlabored  Chest Wall:    No tenderness or deformity   Heart:    Regular rate and rhythm, S1 and S2 normal, no murmur, rub   or gallop  Breast Exam:    No tenderness, masses, or nipple abnormality  Abdomen:     Soft, non-tender, bowel sounds active all four quadrants,    no masses, no organomegaly  Genitalia:    Normal female without lesion, discharge or tenderness  Extremities:   Extremities normal, atraumatic, no cyanosis or edema  Pulses:   2+ and symmetric all extremities  Skin:   Skin color, texture, turgor normal, no rashes or lesions  Lymph nodes:   Cervical, supraclavicular, and axillary nodes normal  Neurologic:   CNII-XII intact, normal strength, sensation and reflexes    throughout      Lab Review Urine pregnancy test Labs reviewed  yes Radiologic studies reviewed no   Assessment:    Pregnancy at [redacted]w[redacted]d weeks    Plan:     1. Supervision of other normal pregnancy, antepartum Rx: - Cytology - PAP - Enroll Patient in Babyscripts - Obstetric Panel, Including HIV - Hemoglobinopathy evaluation - Culture, OB Urine - Cervicovaginal ancillary only - Cystic Fibrosis Mutation 97 - Genetic Screening  2. Nausea and vomiting in pregnancy prior to [redacted] weeks gestation Rx: - Doxylamine-Pyridoxine ER (BONJESTA) 20-20 MG TBCR; Take 1 tablet by mouth 2 (two) times daily.  Dispense: 60 tablet; Refill: 5  3. Vaginal discharge during pregnancy, antepartum Rx: - Prenat-FePoly-Metf-FA-DHA-DSS (VITAFOL FE+) 90-1-200 & 50 MG CPPK; Take 2 tablets by mouth daily after breakfast.  Dispense: 60 each; Refill: 11   Prenatal vitamins.  Counseling provided regarding continued use of seat belts, cessation of alcohol consumption, smoking or use of illicit drugs; infection precautions i.e., influenza/TDAP immunizations, toxoplasmosis,CMV, parvovirus, listeria and varicella; workplace safety, exercise during pregnancy; routine dental care, safe medications, sexual activity, hot tubs, saunas, pools, travel, caffeine use, fish and methlymercury, potential toxins, hair treatments, varicose veins Weight gain recommendations per IOM guidelines reviewed: underweight/BMI< 18.5--> gain 28 - 40 lbs; normal weight/BMI 18.5 - 24.9--> gain 25 - 35 lbs; overweight/BMI 25 - 29.9--> gain 15 - 25 lbs; obese/BMI >30->gain  11 - 20 lbs Problem list reviewed and updated. FIRST/CF mutation testing/NIPT/QUAD SCREEN/fragile X/Ashkenazi Jewish population testing/Spinal muscular atrophy discussed: requested. Role of ultrasound in pregnancy discussed; fetal survey: requested. Amniocentesis discussed: not indicated.   No orders of the defined types were placed in this encounter.  Orders Placed This Encounter  Procedures  . Culture, OB Urine  . Obstetric Panel,  Including HIV  . Hemoglobinopathy evaluation  . Cystic Fibrosis Mutation 97  . Genetic Screening    Follow up in 4 weeks. 50% of 20 min visit spent on counseling and coordination of care.     Brock Bad MD 01-19-2018

## 2018-01-19 NOTE — Progress Notes (Signed)
New OB. LMP 11/22/17. C/o of Intermittent headaches 6/10 x 1 week.  Abdominal pains 5/10 x 2 weeks. Nausea.  Denies chills, fever, discharge and odor.

## 2018-01-20 LAB — CYTOLOGY - PAP
Diagnosis: NEGATIVE
HPV: NOT DETECTED

## 2018-01-20 LAB — CERVICOVAGINAL ANCILLARY ONLY
Chlamydia: NEGATIVE
Neisseria Gonorrhea: NEGATIVE

## 2018-01-23 ENCOUNTER — Telehealth: Payer: Self-pay

## 2018-01-23 ENCOUNTER — Encounter: Payer: Self-pay | Admitting: *Deleted

## 2018-01-23 NOTE — Telephone Encounter (Signed)
[place] to early needed to be at least 9 weeks  Will need to come back for redraw.

## 2018-01-24 LAB — URINE CULTURE, OB REFLEX

## 2018-01-24 LAB — CULTURE, OB URINE

## 2018-01-25 ENCOUNTER — Other Ambulatory Visit: Payer: Self-pay | Admitting: Obstetrics

## 2018-01-25 DIAGNOSIS — N3000 Acute cystitis without hematuria: Secondary | ICD-10-CM

## 2018-01-25 MED ORDER — CEFUROXIME AXETIL 500 MG PO TABS: 500.0000 mg | ORAL_TABLET | Freq: Two times a day (BID) | ORAL | 0 refills | Status: DC

## 2018-01-29 ENCOUNTER — Inpatient Hospital Stay (HOSPITAL_COMMUNITY)
Admission: AD | Admit: 2018-01-29 | Discharge: 2018-01-29 | Disposition: A | Discharge status: Home / Self Care | Payer: Medicaid Other | Source: Ambulatory Visit | Attending: Obstetrics and Gynecology | Admitting: Obstetrics and Gynecology

## 2018-01-29 ENCOUNTER — Other Ambulatory Visit: Payer: Self-pay

## 2018-01-29 ENCOUNTER — Encounter (HOSPITAL_COMMUNITY): Payer: Self-pay | Admitting: *Deleted

## 2018-01-29 DIAGNOSIS — Z87891 Personal history of nicotine dependence: Secondary | ICD-10-CM | POA: Diagnosis not present

## 2018-01-29 DIAGNOSIS — O26891 Other specified pregnancy related conditions, first trimester: Secondary | ICD-10-CM | POA: Diagnosis not present

## 2018-01-29 DIAGNOSIS — Z3A09 9 weeks gestation of pregnancy: Secondary | ICD-10-CM | POA: Diagnosis not present

## 2018-01-29 DIAGNOSIS — R519 Headache, unspecified: Secondary | ICD-10-CM

## 2018-01-29 DIAGNOSIS — R51 Headache: Secondary | ICD-10-CM | POA: Insufficient documentation

## 2018-01-29 LAB — URINALYSIS, ROUTINE W REFLEX MICROSCOPIC
Bilirubin Urine: NEGATIVE
Glucose, UA: NEGATIVE mg/dL
Hgb urine dipstick: NEGATIVE
Ketones, ur: NEGATIVE mg/dL
Leukocytes, UA: NEGATIVE
Nitrite: NEGATIVE
Protein, ur: NEGATIVE mg/dL
Specific Gravity, Urine: 1.027 (ref 1.005–1.030)
pH: 6 (ref 5.0–8.0)

## 2018-01-29 MED ORDER — BUTALBITAL-APAP-CAFFEINE 50-325-40 MG PO TABS: 2.0000 | ORAL_TABLET | Freq: Once | ORAL | Status: DC

## 2018-01-29 NOTE — MAU Provider Note (Signed)
History     CSN: [id number]  Arrival date and time: 01/29/18 1548   First Provider Initiated Contact with Patient 01/29/18 1727      Chief Complaint  Patient presents with  . Headache   HPI Courtney Adams is a 32 y.o. [id number] at [id number] who presents with a headache. She states she has had headaches throughout the pregnancy. She has not tried anything for the pain because she states "I am not a pill popper." She also reports feeling like her heart is racing. She denies any abdominal pain, vaginal bleeding or discharge. She denies any other complaints.  OB History    Gravida  6   Para  2   Term  1   Preterm  1   AB  3   Living  2     SAB      TAB  3   Ectopic      Multiple  0   Live Births  1        Obstetric Comments  - Reportedly had PTL, advised bedrest at 6 months, advised no sexual activity but continued and thinks this may have contributed to pre-term delivery. [facility]        Past Medical History:  Diagnosis Date  . Abortion   . Anemia   . Anxiety   . Anxiety   . Bipolar 1 disorder (HCC)   . BV (bacterial vaginosis)   . Chlamydia   . Gonorrhea   . Headache   . Infection    UTI  . Pneumonia   . Preterm delivery   . Preterm labor   . Trichomonas   . Vaginal Pap smear, abnormal     Past Surgical History:  Procedure Laterality Date  . DILATION AND CURETTAGE OF UTERUS      Family History  Problem Relation Age of Onset  . Anemia Mother   . Varicose Veins Mother   . Cervical cancer Sister   . Cancer Sister   . Diabetes Maternal Grandmother   . Heart disease Paternal Grandmother   . Heart disease Paternal Grandfather   . Stroke Paternal Grandfather   . Stroke Paternal Uncle     Social History   Tobacco Use  . Smoking status: Former Smoker    Types: Cigarettes    Last attempt to quit: 02/20/2017    Years since quitting: 0.9  . Smokeless tobacco: Never Used  Substance Use Topics  .  Alcohol use: No    Alcohol/week: 0.0 standard drinks  . Drug use: Yes    Frequency: 5.0 times per week    Types: Marijuana    Allergies: No Known Allergies  Medications Prior to Admission  Medication Sig Dispense Refill Last Dose  . amoxicillin-clavulanate (AUGMENTIN) 875-125 MG tablet Take 1 tablet by mouth every 12 (twelve) hours. (Patient not taking: Reported on 01/19/2018) 20 tablet 0 Not Taking  . azithromycin (ZITHROMAX Z-PAK) 250 MG tablet Take 1 tablet (250 mg total) by mouth daily. Take as directed (Patient not taking: Reported on 01/19/2018) 4 tablet 0 Not Taking  . cefUROXime (CEFTIN) 500 MG tablet Take 1 tablet (500 mg total) by mouth 2 (two) times daily with a meal. 14 tablet 0   . Doxylamine-Pyridoxine (DICLEGIS) 10-10 MG TBEC 1 tab in AM, 1 tab mid afternoon 2 tabs at bedtime. Max dose 4 tabs daily. 100 tablet 5   . Doxylamine-Pyridoxine ER (BONJESTA) 20-20 MG TBCR Take 1  tablet by mouth 2 (two) times daily. 60 tablet 5   . fluticasone (FLONASE) 50 MCG/ACT nasal spray Place 2 sprays into both nostrils daily. (Patient not taking: Reported on 05/25/2017) 16 g 6 Not Taking  . meloxicam (MOBIC) 7.5 MG tablet Take 1 tablet (7.5 mg total) by mouth daily. (Patient not taking: Reported on 01/19/2018) 30 tablet 0 Not Taking  . metroNIDAZOLE (METROGEL) 0.75 % vaginal gel Place 1 Applicatorful vaginally 2 (two) times daily. (Patient not taking: Reported on 01/19/2018) 70 g 0 Not Taking  . omeprazole (PRILOSEC) 20 MG capsule Take 1 capsule (20 mg total) by mouth daily. (Patient not taking: Reported on 01/19/2018) 30 capsule 1 Not Taking  . Prenat-FePoly-Metf-FA-DHA-DSS (VITAFOL FE+) 90-1-200 & 50 MG CPPK Take 2 tablets by mouth daily after breakfast. 60 each 11   . Prenatal Multivit-Min-Fe-FA (PRENATAL VITAMINS) 0.8 MG tablet Take 1 tablet by mouth daily. (Patient not taking: Reported on 01/19/2018) 90 tablet 3 Not Taking    Review of Systems  Constitutional: Negative.  Negative for fatigue and  fever.  HENT: Negative.   Respiratory: Negative.  Negative for shortness of breath.   Cardiovascular: Positive for palpitations. Negative for chest pain.  Gastrointestinal: Negative.  Negative for abdominal pain, constipation, diarrhea, nausea and vomiting.  Genitourinary: Negative.  Negative for dysuria, vaginal bleeding and vaginal discharge.  Neurological: Positive for headaches. Negative for dizziness.   Physical Exam   Blood pressure 111/65, pulse 88, temperature 98.7 F (37.1 C), temperature source Oral, resp. rate 18, weight 53.5 kg, last menstrual period 11/22/2017, SpO2 100 %.  Physical Exam  Nursing note and vitals reviewed. Constitutional: She is oriented to person, place, and time. She appears well-developed and well-nourished. No distress.  HENT:  Head: Normocephalic.  Eyes: Pupils are equal, round, and reactive to light.  Cardiovascular: Normal rate, regular rhythm and normal heart sounds.  No murmur heard. Respiratory: Effort normal and breath sounds normal. No respiratory distress.  GI: Soft. Bowel sounds are normal. She exhibits no distension. There is no tenderness.  Neurological: She is alert and oriented to person, place, and time. She has normal reflexes. No cranial nerve deficit. Coordination normal.  Skin: Skin is warm and dry.  Psychiatric: She has a normal mood and affect. Her behavior is normal. Judgment and thought content normal.    MAU Course  Procedures Results for orders placed or performed during the hospital encounter of 01/29/18 (from the past 24 hour(s))  Urinalysis, Routine w reflex microscopic     Status: None   Collection Time: 01/29/18  4:29 PM  Result Value Ref Range   Color, Urine YELLOW YELLOW   APPearance CLEAR CLEAR   Specific Gravity, Urine 1.027 1.005 - 1.030   pH 6.0 5.0 - 8.0   Glucose, UA NEGATIVE NEGATIVE mg/dL   Hgb urine dipstick NEGATIVE NEGATIVE   Bilirubin Urine NEGATIVE NEGATIVE   Ketones, ur NEGATIVE NEGATIVE mg/dL    Protein, ur NEGATIVE NEGATIVE mg/dL   Nitrite NEGATIVE NEGATIVE   Leukocytes, UA NEGATIVE NEGATIVE   MDM UA ED EKG- normal sinus rhythm  Patient refused any treatment for her headache. Requested discharge after speaking with provider. Informed patient that CNM was waiting for consult with cardiology to read the EKG and the patient did not want to stay any longer.  Assessment and Plan   1. Pregnancy headache in first trimester   2. [redacted] weeks gestation of pregnancy    -Discharge home in stable condition -Headache precautions discussed -Patient advised to follow-up  with [facility] as scheduled for prenatal care -Patient may return to MAU as needed or if her condition were to change or worsen  Rolm BookbinderCaroline M Tyria Springer CNM 01/29/2018, 5:27 PM

## 2018-01-29 NOTE — Discharge Instructions (Signed)

## 2018-01-29 NOTE — MAU Note (Signed)
Been having headaches the past couple wks, had not taken anything.breathing isn't could, asthma pump didn't help.  Feels like heartbeat is off.  Currently on meds for UTI.  Isn't sleeping good at all.

## 2018-01-30 LAB — OBSTETRIC PANEL, INCLUDING HIV
Antibody Screen: NEGATIVE
Basophils Absolute: 0 10*3/uL (ref 0.0–0.2)
Basos: 0 %
EOS (ABSOLUTE): 0 10*3/uL (ref 0.0–0.4)
Eos: 1 %
HIV Screen 4th Generation wRfx: NONREACTIVE
Hematocrit: 34.9 % (ref 34.0–46.6)
Hemoglobin: 11.4 g/dL (ref 11.1–15.9)
Hepatitis B Surface Ag: NEGATIVE
Immature Grans (Abs): 0 10*3/uL (ref 0.0–0.1)
Immature Granulocytes: 0 %
Lymphocytes Absolute: 1.2 10*3/uL (ref 0.7–3.1)
Lymphs: 27 %
MCH: 31.8 pg (ref 26.6–33.0)
MCHC: 32.7 g/dL (ref 31.5–35.7)
MCV: 98 fL — ABNORMAL HIGH (ref 79–97)
Monocytes Absolute: 0.3 10*3/uL (ref 0.1–0.9)
Monocytes: 7 %
Neutrophils Absolute: 2.8 10*3/uL (ref 1.4–7.0)
Neutrophils: 65 %
Platelets: 222 10*3/uL (ref 150–450)
RBC: 3.58 x10E6/uL — ABNORMAL LOW (ref 3.77–5.28)
RDW: 13.1 % (ref 12.3–15.4)
RPR Ser Ql: NONREACTIVE
Rh Factor: POSITIVE
Rubella Antibodies, IGG: 2.08 index (ref >0.99)
WBC: 4.3 10*3/uL (ref 3.4–10.8)

## 2018-01-30 LAB — HEMOGLOBINOPATHY EVALUATION
HGB C: 0 %
HGB S: 0 %
HGB VARIANT: 0 %
Hemoglobin A2 Quantitation: 2.4 % (ref 1.8–3.2)
Hemoglobin F Quantitation: 0.5 % (ref 0.0–2.0)
Hgb A: 97.1 % (ref 96.4–98.8)

## 2018-01-30 LAB — CYSTIC FIBROSIS MUTATION 97: Interpretation: NOT DETECTED

## 2018-02-12 ENCOUNTER — Other Ambulatory Visit: Payer: Self-pay | Admitting: Obstetrics

## 2018-02-16 ENCOUNTER — Encounter: Payer: Medicaid Other | Admitting: Obstetrics

## 2018-03-03 NOTE — Progress Notes (Signed)
Attempt to contact pt regarding need for appt -ROB.  Pt states she is no longer pregnant and does not have any other needs at this time. Pt advised to contact office as needed.

## 2018-05-09 IMAGING — DX DG CHEST 2V
2 series · 2 of 2 positions shown · non-contrast
Comparison: 04/03/2014

CLINICAL DATA: Mid chest pain for week on LEFT side under breast,
shortness of breath today, smoker

EXAM:
CHEST  2 VIEW

[chest pa]
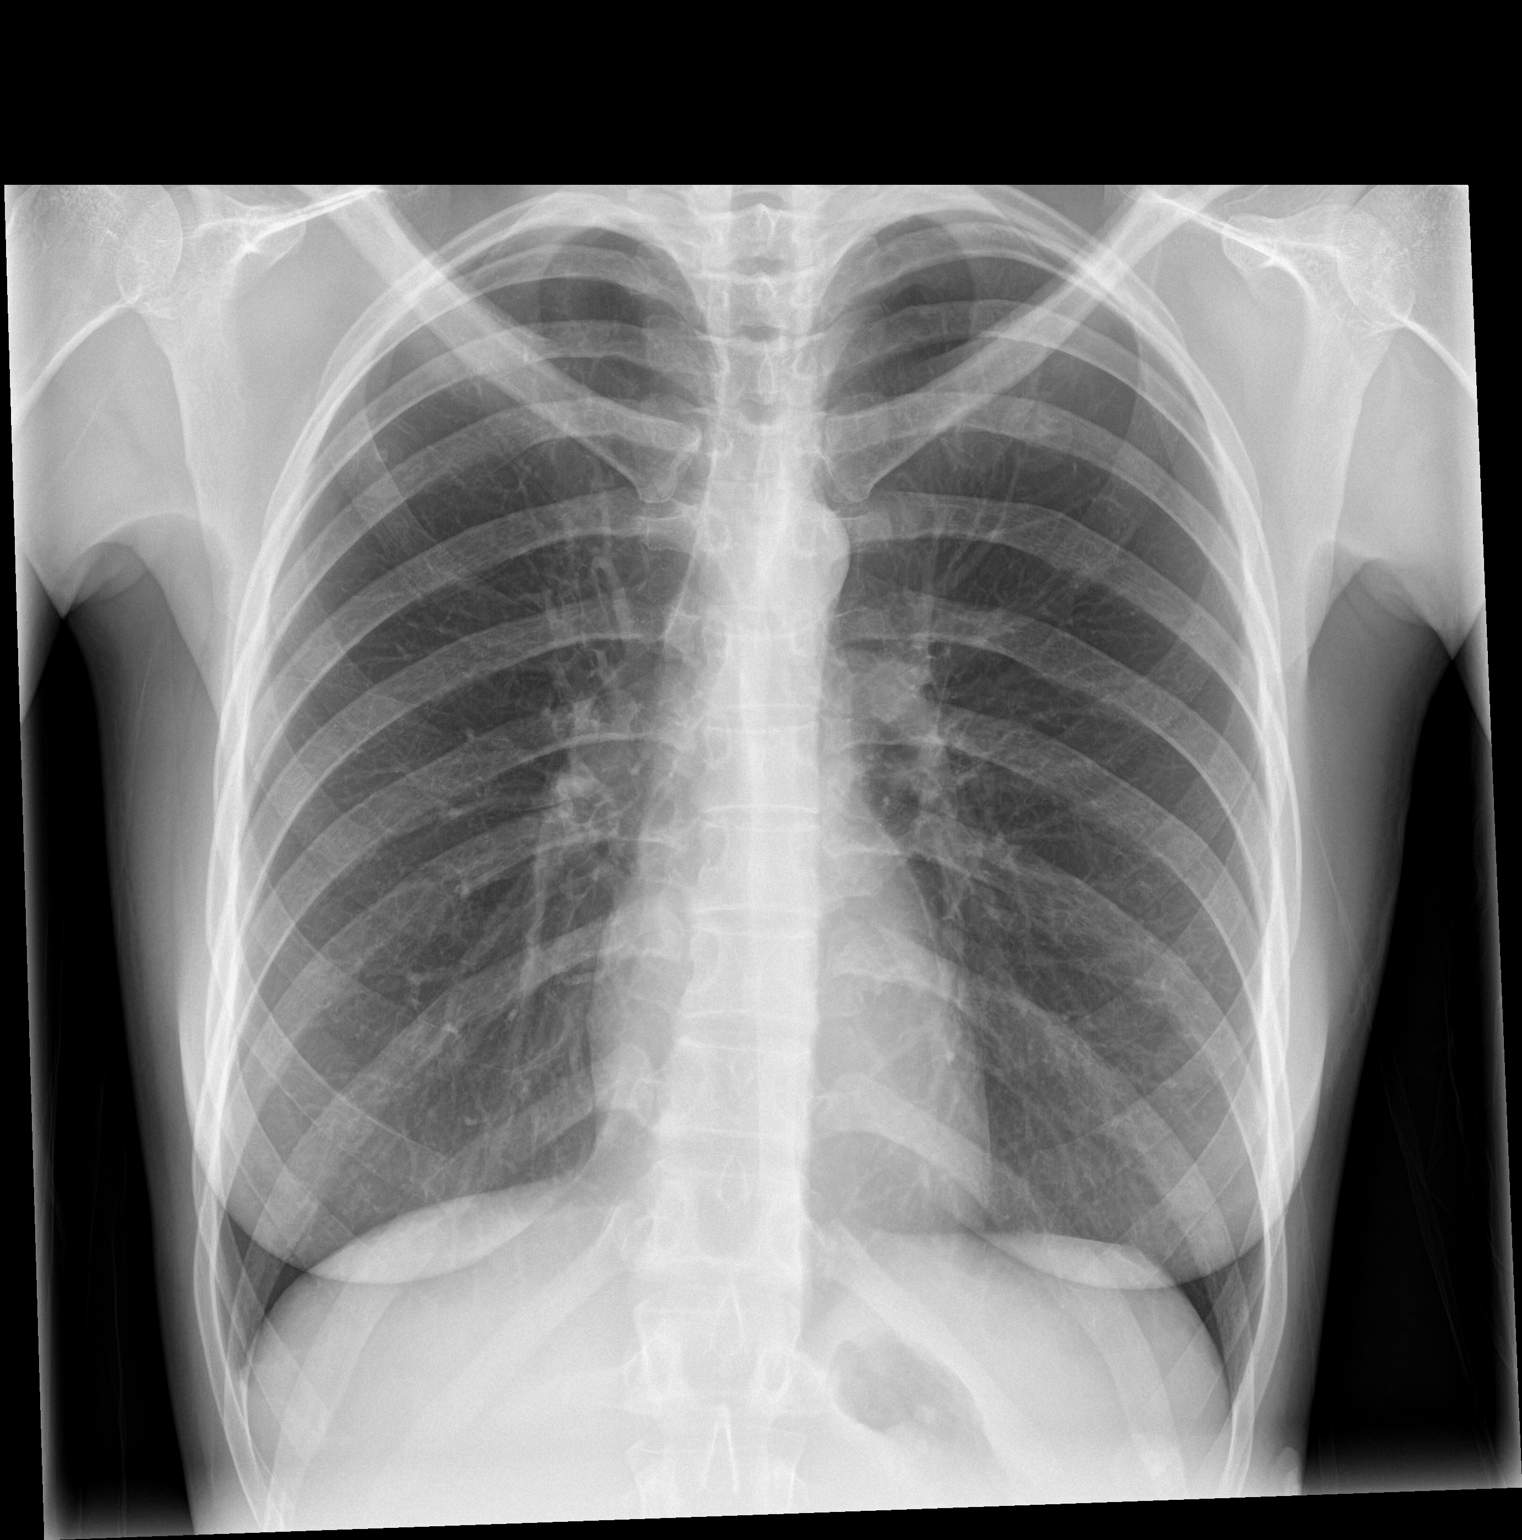

[chest lat]
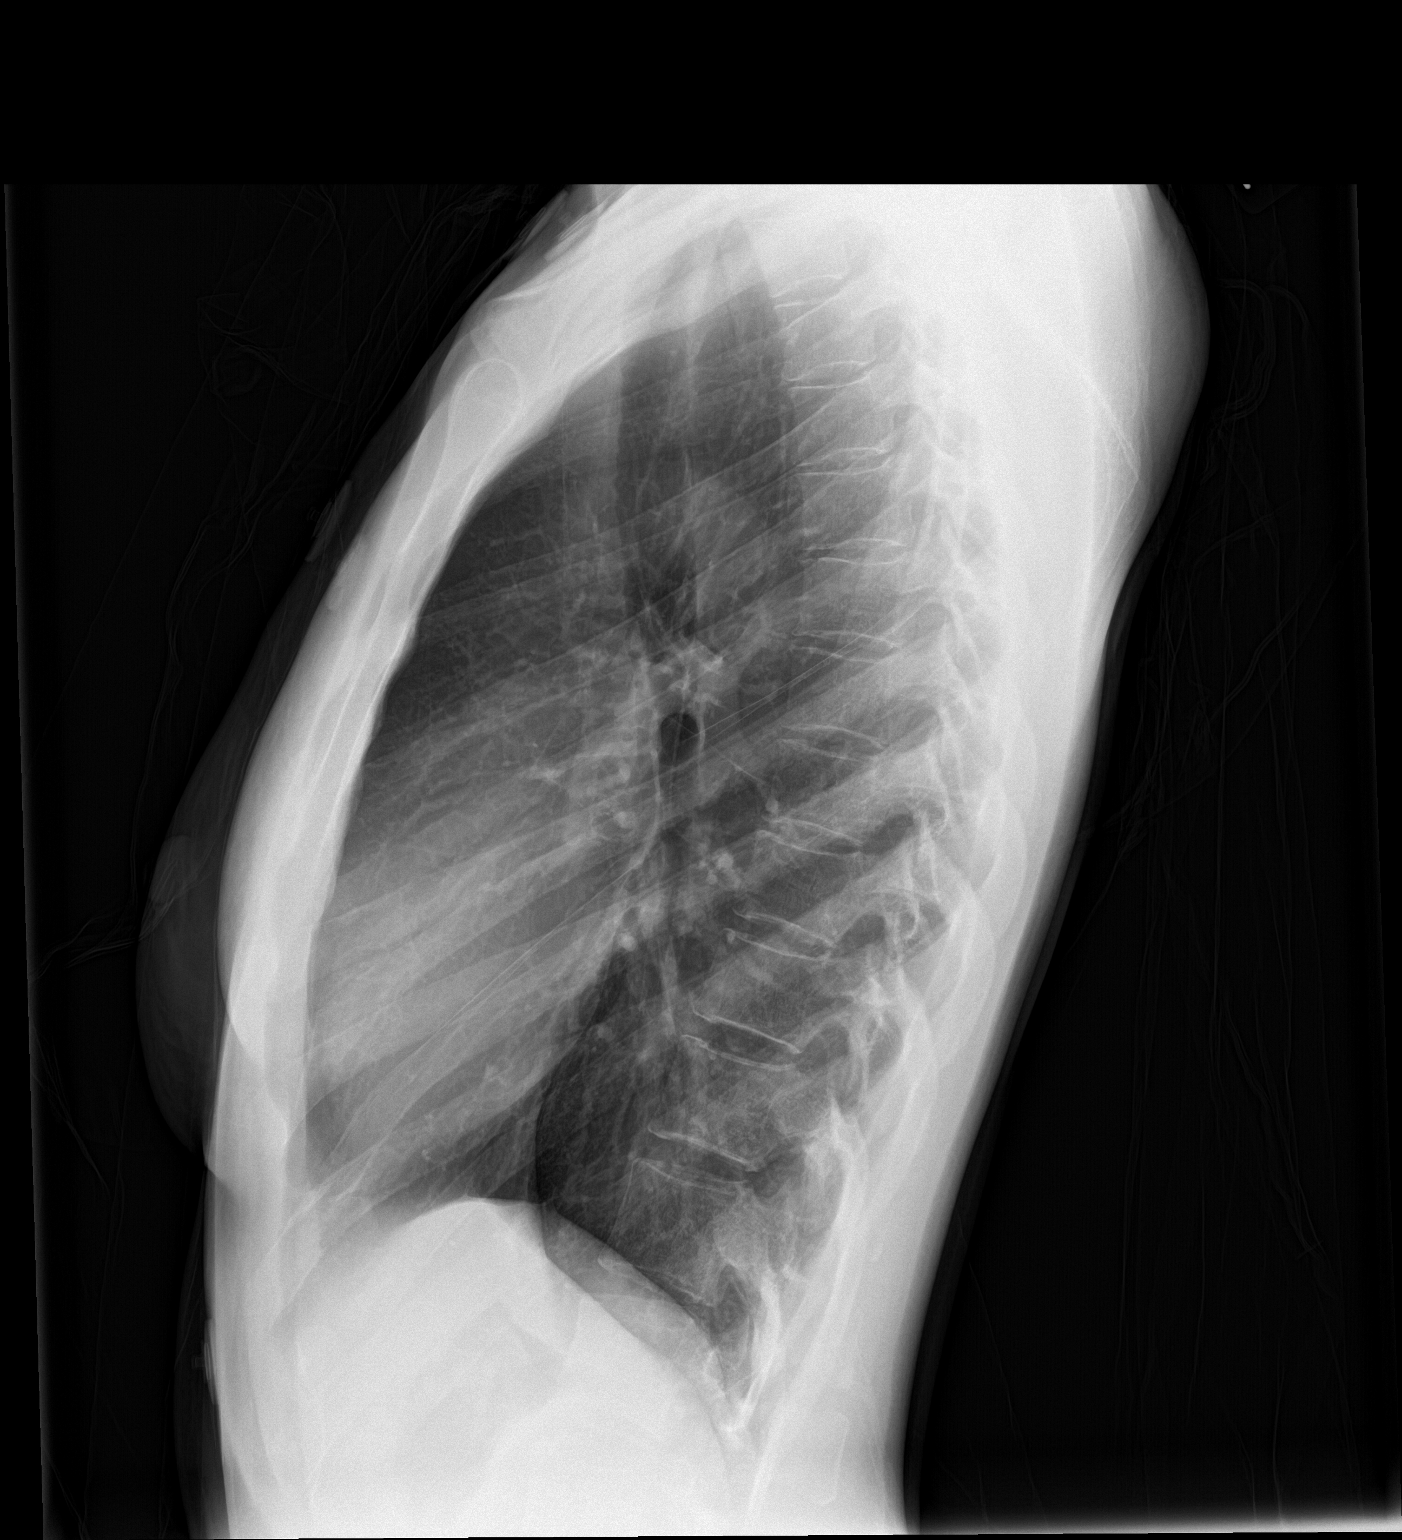

[2 of 2 positions shown; findings below may reference images not displayed]

FINDINGS: Normal heart size, mediastinal contours, and pulmonary vascularity.

Lungs minimally hyperaerated but clear.

No pulmonary infiltrate, pleural effusion or pneumothorax.

Osseous structures unremarkable.
IMPRESSION: Minimal hyperinflation without acute abnormalities.

## 2018-11-21 ENCOUNTER — Encounter (HOSPITAL_COMMUNITY): Payer: Self-pay

## 2019-02-23 ENCOUNTER — Emergency Department (HOSPITAL_COMMUNITY)
Admission: EM | Admit: 2019-02-23 | Discharge: 2019-02-23 | Disposition: A | Discharge status: Home / Self Care | Payer: Medicaid Other | Attending: Emergency Medicine | Admitting: Emergency Medicine

## 2019-02-23 ENCOUNTER — Encounter (HOSPITAL_COMMUNITY): Payer: Self-pay | Admitting: *Deleted

## 2019-02-23 ENCOUNTER — Other Ambulatory Visit: Payer: Self-pay

## 2019-02-23 DIAGNOSIS — Z5321 Procedure and treatment not carried out due to patient leaving prior to being seen by health care provider: Secondary | ICD-10-CM | POA: Insufficient documentation

## 2019-02-23 DIAGNOSIS — R079 Chest pain, unspecified: Secondary | ICD-10-CM | POA: Diagnosis present

## 2019-02-23 LAB — CBC
HCT: 41.4 % (ref 36.0–46.0)
Hemoglobin: 12.9 g/dL (ref 12.0–15.0)
MCH: 32 pg (ref 26.0–34.0)
MCHC: 31.2 g/dL (ref 30.0–36.0)
MCV: 102.7 fL — ABNORMAL HIGH (ref 80.0–100.0)
Platelets: 172 10*3/uL (ref 150–400)
RBC: 4.03 MIL/uL (ref 3.87–5.11)
RDW: 12.2 % (ref 11.5–15.5)
WBC: 4.2 10*3/uL (ref 4.0–10.5)
nRBC: 0 % (ref 0.0–0.2)

## 2019-02-23 LAB — BASIC METABOLIC PANEL
Anion gap: 9 (ref 5–15)
BUN: 9 mg/dL (ref 6–20)
CO2: 24 mmol/L (ref 22–32)
Calcium: 9.3 mg/dL (ref 8.9–10.3)
Chloride: 105 mmol/L (ref 98–111)
Creatinine, Ser: 0.85 mg/dL (ref 0.44–1.00)
GFR calc Af Amer: >60 mL/min (ref >60)
GFR calc non Af Amer: >60 mL/min (ref >60)
Glucose, Bld: 113 mg/dL — ABNORMAL HIGH (ref 70–99)
Potassium: 3.4 mmol/L — ABNORMAL LOW (ref 3.5–5.1)
Sodium: 138 mmol/L (ref 135–145)

## 2019-02-23 LAB — TROPONIN I (HIGH SENSITIVITY): Troponin I (High Sensitivity): 2 ng/L (ref <18)

## 2019-02-23 MED ORDER — SODIUM CHLORIDE 0.9% FLUSH: 3.0000 mL | Freq: Once | INTRAVENOUS | Status: DC

## 2019-02-23 NOTE — ED Triage Notes (Signed)
The pt has had chest pain for 1-2 weeks  She has been diagnosed with anxiety  She had some chest pain earlier today.    lmp  Last month

## 2019-02-26 LAB — I-STAT BETA HCG BLOOD, ED (MC, WL, AP ONLY): I-stat hCG, quantitative: <5 m[IU]/mL (ref <5)

## 2020-01-01 ENCOUNTER — Encounter (HOSPITAL_COMMUNITY): Payer: Self-pay

## 2020-01-01 ENCOUNTER — Other Ambulatory Visit: Payer: Self-pay

## 2020-01-01 ENCOUNTER — Ambulatory Visit (HOSPITAL_COMMUNITY)
Admission: EM | Admit: 2020-01-01 | Discharge: 2020-01-01 | Disposition: A | Discharge status: Home / Self Care | Payer: Medicaid Other | Attending: Family Medicine | Admitting: Family Medicine

## 2020-01-01 DIAGNOSIS — R519 Headache, unspecified: Secondary | ICD-10-CM | POA: Diagnosis not present

## 2020-01-01 DIAGNOSIS — R6883 Chills (without fever): Secondary | ICD-10-CM

## 2020-01-01 DIAGNOSIS — J029 Acute pharyngitis, unspecified: Secondary | ICD-10-CM | POA: Diagnosis present

## 2020-01-01 DIAGNOSIS — Z20822 Contact with and (suspected) exposure to covid-19: Secondary | ICD-10-CM | POA: Insufficient documentation

## 2020-01-01 DIAGNOSIS — Z87891 Personal history of nicotine dependence: Secondary | ICD-10-CM | POA: Insufficient documentation

## 2020-01-01 LAB — POCT RAPID STREP A: Streptococcus, Group A Screen (Direct): NEGATIVE

## 2020-01-01 LAB — SARS CORONAVIRUS 2 (TAT 6-24 HRS): SARS Coronavirus 2: NEGATIVE

## 2020-01-01 NOTE — ED Triage Notes (Signed)
Pt reports chills, sore throat and headache x 3 days.Motrin give somewhat relief.

## 2020-01-01 NOTE — ED Provider Notes (Signed)
[facility]    CSN: [id number] Arrival date & time: 01/01/20  5465      History   Chief Complaint Chief Complaint  Patient presents with  . Sore Throat  . Headache  . Chills    HPI Courtney Adams is a 34 y.o. female.   HPI  Patient is here with her son.  They both have fever, headache, and sore throat.  Child has cough and runny nose as well. No known exposure to infection No exposure to strep or Covid Has not had Covid vaccinations  Past Medical History:  Diagnosis Date  . Abortion   . Anemia   . Anxiety   . Anxiety   . Bipolar 1 disorder (HCC)   . BV (bacterial vaginosis)   . Chlamydia   . Gonorrhea   . Headache   . Infection    UTI  . Pneumonia   . Preterm delivery   . Preterm labor   . Trichomonas   . Vaginal Pap smear, abnormal     Patient Active Problem List   Diagnosis Date Noted  . Supervision of other normal pregnancy, antepartum 01/19/2018  . Chest pain 06/09/2017  . GAD (generalized anxiety disorder) 12/22/2016  . Tobacco use disorder 11/20/2015  . Occipital lymphadenopathy 01/22/2014  . Hypotension, unspecified 01/22/2014  . High risk sexual behavior 10/19/2013  . Bacterial vaginosis 06/06/2013  . Mood disorder (HCC) 06/19/2012  . Marijuana abuse 06/19/2012  . Encounter for preconception consultation 06/19/2012    Past Surgical History:  Procedure Laterality Date  . DILATION AND CURETTAGE OF UTERUS      OB History    Gravida  6   Para  2   Term  1   Preterm  1   AB  3   Living  2     SAB      TAB  3   Ectopic      Multiple  0   Live Births  1        Obstetric Comments  - Reportedly had PTL, advised bedrest at 6 months, advised no sexual activity but continued and thinks this may have contributed to pre-term delivery. Delivery [facility]VD at [facility] - NICU for [facility] days         Home Medications    Prior to Admission medications   Medication Sig Start Date End Date Taking? Authorizing  Provider  ibuprofen (ADVIL) 200 MG tablet Take 200 mg by mouth every 6 (six) hours as needed.   Yes [provider]  fluticasone (FLONASE) 50 MCG/ACT nasal spray Place 2 sprays into both nostrils daily. Patient not taking: Reported on 05/25/2017 04/14/17 01/01/20  Almon Hercules, MD  omeprazole (PRILOSEC) 20 MG capsule Take 1 capsule (20 mg total) by mouth daily. Patient not taking: Reported on 01/19/2018 06/09/17 01/01/20  Freddrick March, MD    Family History Family History  Problem Relation Age of Onset  . Anemia Mother   . Varicose Veins Mother   . Cervical cancer Sister   . Cancer Sister   . Diabetes Maternal Grandmother   . Heart disease Paternal Grandmother   . Heart disease Paternal Grandfather   . Stroke Paternal Grandfather   . Stroke Paternal Uncle     Social History Social History   Tobacco Use  . Smoking status: Former Smoker    Types: Cigarettes    Quit date: 02/20/2017    Years since quitting: 2.8  . Smokeless tobacco: Never Used  Vaping Use  . Vaping Use: Never used  Substance Use Topics  . Alcohol use: No    Alcohol/week: 0.0 standard drinks  . Drug use: Yes    Frequency: 5.0 times per week    Types: Marijuana     Allergies   Patient has no known allergies.   Review of Systems Review of Systems See HPI Physical Exam Triage Vital Signs ED Triage Vitals  Enc Vitals Group     BP 01/01/20 1013 108/67     Pulse Rate 01/01/20 1013 94     Resp 01/01/20 1013 16     Temp 01/01/20 1013 100.2 F (37.9 C)     Temp Source 01/01/20 1013 Oral     SpO2 01/01/20 1013 100 %     Weight --      Height --      Head Circumference --      Peak Flow --      Pain Score 01/01/20 1012 10     Pain Loc --      Pain Edu? --      Excl. in GC? --    No data found.  Updated Vital Signs BP 108/67 (BP Location: Right Arm)   Pulse 94   Temp 100.2 F (37.9 C) (Oral)   Resp 16   LMP 12/31/2019 (Exact Date)   SpO2 100%   Visual Acuity Right Eye Distance:    Left Eye Distance:   Bilateral Distance:    Right Eye Near:   Left Eye Near:    Bilateral Near:     Physical Exam Constitutional:      General: She is not in acute distress.    Appearance: Normal appearance. She is well-developed and normal weight.  HENT:     Head: Normocephalic and atraumatic.     Right Ear: Tympanic membrane, ear canal and external ear normal.     Left Ear: Tympanic membrane, ear canal and external ear normal.     Nose: Nose normal. No rhinorrhea.     Mouth/Throat:     Mouth: Mucous membranes are moist.     Pharynx: Posterior oropharyngeal erythema present.     Comments: Scant exudate Eyes:     Conjunctiva/sclera: Conjunctivae normal.     Pupils: Pupils are equal, round, and reactive to light.  Cardiovascular:     Rate and Rhythm: Normal rate and regular rhythm.     Heart sounds: Normal heart sounds.  Pulmonary:     Effort: Pulmonary effort is normal. No respiratory distress.     Breath sounds: Normal breath sounds.  Musculoskeletal:        General: Normal range of motion.     Cervical back: Normal range of motion.  Lymphadenopathy:     Cervical: No cervical adenopathy.  Skin:    General: Skin is warm and dry.  Neurological:     General: No focal deficit present.     Mental Status: She is alert.  Psychiatric:        Mood and Affect: Mood normal.        Behavior: Behavior normal.      UC Treatments / Results  Labs (all labs ordered are listed, but only abnormal results are displayed) Labs Reviewed  SARS CORONAVIRUS 2 (TAT 6-24 HRS)  CULTURE, GROUP A STREP [facility])  POCT RAPID STREP A  CYTOLOGY, (ORAL, ANAL, URETHRAL) ANCILLARY ONLY    EKG   Radiology No results found.  Procedures Procedures (including critical care time)  Medications  Ordered in UC Medications - No data to display  Initial Impression / Assessment and Plan / UC Course  I have reviewed the triage vital signs and the nursing notes.  Pertinent labs & imaging results  that were available during my care of the patient were reviewed by me and considered in my medical decision making (see chart for details).    As I was attempting to discharge patient she asked if she could have additional swab of her throat.  She states she is worried about getting infection in her throat from oral sex.  I did oral cytology as well as the strep culture.  She is told how to obtain her test results  Final Clinical Impressions(s) / UC Diagnoses   Final diagnoses:  Sore throat     Discharge Instructions     Check for your culture results in MyChart.  They will be available in a day or 2 Drink plenty of liquids Take Tylenol or ibuprofen for pain or fever   ED Prescriptions    None     PDMP not reviewed this encounter.   Eustace Moore, MD 01/01/20 2105

## 2020-01-01 NOTE — Discharge Instructions (Addendum)
Check for your culture results in MyChart.  They will be available in a day or 2 Drink plenty of liquids Take Tylenol or ibuprofen for pain or fever

## 2020-01-02 ENCOUNTER — Emergency Department (HOSPITAL_COMMUNITY)
Admission: EM | Admit: 2020-01-02 | Discharge: 2020-01-03 | Disposition: A | Discharge status: Home / Self Care | Payer: Medicaid Other | Attending: Emergency Medicine | Admitting: Emergency Medicine

## 2020-01-02 ENCOUNTER — Other Ambulatory Visit: Payer: Self-pay

## 2020-01-02 ENCOUNTER — Encounter (HOSPITAL_COMMUNITY): Payer: Self-pay | Admitting: Emergency Medicine

## 2020-01-02 DIAGNOSIS — R079 Chest pain, unspecified: Secondary | ICD-10-CM | POA: Diagnosis present

## 2020-01-02 DIAGNOSIS — Z20822 Contact with and (suspected) exposure to covid-19: Secondary | ICD-10-CM | POA: Diagnosis not present

## 2020-01-02 DIAGNOSIS — R0602 Shortness of breath: Secondary | ICD-10-CM | POA: Diagnosis not present

## 2020-01-02 DIAGNOSIS — Z5321 Procedure and treatment not carried out due to patient leaving prior to being seen by health care provider: Secondary | ICD-10-CM | POA: Insufficient documentation

## 2020-01-02 LAB — CYTOLOGY, (ORAL, ANAL, URETHRAL) ANCILLARY ONLY
Chlamydia: NEGATIVE
Comment: NEGATIVE
Comment: NORMAL
Neisseria Gonorrhea: POSITIVE — AB

## 2020-01-02 MED ORDER — SODIUM CHLORIDE 0.9% FLUSH: 3.0000 mL | Freq: Once | INTRAVENOUS | Status: DC

## 2020-01-02 NOTE — ED Triage Notes (Signed)
Patient reports pain across her chest for 4 days with SOB , no emesis or diaphoresis , denies fever or cough.

## 2020-01-03 ENCOUNTER — Emergency Department (HOSPITAL_COMMUNITY): Payer: Medicaid Other

## 2020-01-03 LAB — TROPONIN I (HIGH SENSITIVITY)
Troponin I (High Sensitivity): 5 ng/L (ref <18)
Troponin I (High Sensitivity): 4 ng/L (ref <18)

## 2020-01-03 LAB — BASIC METABOLIC PANEL
Anion gap: 13 (ref 5–15)
BUN: 8 mg/dL (ref 6–20)
CO2: 23 mmol/L (ref 22–32)
Calcium: 9.1 mg/dL (ref 8.9–10.3)
Chloride: 97 mmol/L — ABNORMAL LOW (ref 98–111)
Creatinine, Ser: 0.88 mg/dL (ref 0.44–1.00)
GFR calc Af Amer: >60 mL/min (ref >60)
GFR calc non Af Amer: >60 mL/min (ref >60)
Glucose, Bld: 192 mg/dL — ABNORMAL HIGH (ref 70–99)
Potassium: 2.7 mmol/L — CL (ref 3.5–5.1)
Sodium: 133 mmol/L — ABNORMAL LOW (ref 135–145)

## 2020-01-03 LAB — CBC
HCT: 37.2 % (ref 36.0–46.0)
Hemoglobin: 11.9 g/dL — ABNORMAL LOW (ref 12.0–15.0)
MCH: 31.6 pg (ref 26.0–34.0)
MCHC: 32 g/dL (ref 30.0–36.0)
MCV: 98.9 fL (ref 80.0–100.0)
Platelets: 159 10*3/uL (ref 150–400)
RBC: 3.76 MIL/uL — ABNORMAL LOW (ref 3.87–5.11)
RDW: 11.9 % (ref 11.5–15.5)
WBC: 8.5 10*3/uL (ref 4.0–10.5)
nRBC: 0 % (ref 0.0–0.2)

## 2020-01-03 LAB — CULTURE, GROUP A STREP (THRC)

## 2020-01-03 LAB — I-STAT BETA HCG BLOOD, ED (MC, WL, AP ONLY): I-stat hCG, quantitative: <5 m[IU]/mL (ref <5)

## 2020-01-03 LAB — SARS CORONAVIRUS 2 BY RT PCR (HOSPITAL ORDER, PERFORMED IN ~~LOC~~ HOSPITAL LAB): SARS Coronavirus 2: NEGATIVE

## 2020-01-03 MED ORDER — ACETAMINOPHEN 325 MG PO TABS: 650.0000 mg | ORAL_TABLET | Freq: Once | ORAL | Status: DC

## 2020-01-03 MED ORDER — POTASSIUM CHLORIDE CRYS ER 20 MEQ PO TBCR
40.0000 meq | EXTENDED_RELEASE_TABLET | Freq: Once | ORAL | Status: DC
  Filled 2020-01-03: qty 2

## 2020-01-03 NOTE — ED Notes (Signed)
Pt refused vitals 

## 2020-09-08 DIAGNOSIS — Z20822 Contact with and (suspected) exposure to covid-19: Secondary | ICD-10-CM | POA: Diagnosis not present

## 2020-09-08 DIAGNOSIS — J069 Acute upper respiratory infection, unspecified: Secondary | ICD-10-CM | POA: Diagnosis not present

## 2020-11-11 ENCOUNTER — Ambulatory Visit: Payer: Medicaid Other | Admitting: Nurse Practitioner

## 2020-11-11 ENCOUNTER — Other Ambulatory Visit: Payer: Self-pay

## 2020-11-11 ENCOUNTER — Encounter: Payer: Self-pay | Admitting: Nurse Practitioner

## 2020-11-11 VITALS — BP 98/66 | HR 58 | Temp 98.5°F | Ht 67.72 in | Wt 114.5 lb

## 2020-11-11 DIAGNOSIS — Z114 Encounter for screening for human immunodeficiency virus [HIV]: Secondary | ICD-10-CM

## 2020-11-11 DIAGNOSIS — Z1159 Encounter for screening for other viral diseases: Secondary | ICD-10-CM

## 2020-11-11 DIAGNOSIS — Z7689 Persons encountering health services in other specified circumstances: Secondary | ICD-10-CM | POA: Diagnosis not present

## 2020-11-11 DIAGNOSIS — Z113 Encounter for screening for infections with a predominantly sexual mode of transmission: Secondary | ICD-10-CM

## 2020-11-11 NOTE — Progress Notes (Signed)
BP 98/66   Pulse (!) 58   Temp 98.5 F (36.9 C)   Ht 5' 7.72" (1.72 m)   Wt 114 lb 8 oz (51.9 kg)   SpO2 100%   BMI 17.56 kg/m    Subjective:    Patient ID: Courtney Adams, female    DOB: [birth year], 35 y.o.   MRN: [id number]  HPI: Courtney Adams is a 35 y.o. female  Chief Complaint  Patient presents with  . Establish Care  . STD check   Patient presents to clinic to establish care with new PCP.  Patient reports a history of iDepression seasonal allergies, and anxiety.  Patient would like to have an STD check during v  Patient denies a history of: Hypertension, Elevated Cholesterol, Diabetes, Thyroid problems,  Neurological problems, and Abdominal problems.   Denies past surgical history.  Denies HA, CP, SOB, dizziness, palpitations, visual changes, and lower extremity swelling. Relevant past medical, surgical, family and social history reviewed and updated as indicated. Interim medical history since our last visit reviewed. Allergies and medications reviewed and updated.  Review of Systems  Eyes: Negative for visual disturbance.  Respiratory: Negative for cough, chest tightness and shortness of breath.   Cardiovascular: Negative for chest pain, palpitations and leg swelling.  Neurological: Negative for dizziness and headaches.    Per HPI unless specifically indicated above     Objective:    BP 98/66   Pulse (!) 58   Temp 98.5 F (36.9 C)   Ht 5' 7.72" (1.72 m)   Wt 114 lb 8 oz (51.9 kg)   SpO2 100%   BMI 17.56 kg/m   Wt Readings from Last 3 Encounters:  11/11/20 114 lb 8 oz (51.9 kg)  01/02/20 105 lb 13.1 oz (48 kg)  02/23/19 100 lb 15.5 oz (45.8 kg)    Physical Exam Vitals and nursing note reviewed.  Constitutional:      General: She is not in acute distress.    Appearance: Normal appearance. She is normal weight. She is not ill-appearing, toxic-appearing or diaphoretic.  HENT:     Head: Normocephalic.     Right Ear: External ear normal.     Left  Ear: External ear normal.     Nose: Nose normal.     Mouth/Throat:     Mouth: Mucous membranes are moist.     Pharynx: Oropharynx is clear.  Eyes:     General:        Right eye: No discharge.        Left eye: No discharge.     Extraocular Movements: Extraocular movements intact.     Conjunctiva/sclera: Conjunctivae normal.     Pupils: Pupils are equal, round, and reactive to light.  Cardiovascular:     Rate and Rhythm: Normal rate and regular rhythm.     Heart sounds: No murmur heard.   Pulmonary:     Effort: Pulmonary effort is normal. No respiratory distress.     Breath sounds: Normal breath sounds. No wheezing or rales.  Musculoskeletal:     Cervical back: Normal range of motion and neck supple.  Skin:    General: Skin is warm and dry.     Capillary Refill: Capillary refill takes less than 2 seconds.  Neurological:     General: No focal deficit present.     Mental Status: She is alert and oriented to person, place, and time. Mental status is at baseline.  Psychiatric:  Mood and Affect: Mood normal.        Behavior: Behavior normal.        Thought Content: Thought content normal.        Judgment: Judgment normal.     Results for orders placed or performed during the hospital encounter of 01/02/20  SARS Coronavirus 2 by RT PCR (hospital order, performed in [facility] hospital lab) Nasopharyngeal Nasopharyngeal Swab   Specimen: Nasopharyngeal Swab  Result Value Ref Range   SARS Coronavirus 2 NEGATIVE NEGATIVE  Basic metabolic panel  Result Value Ref Range   Sodium 133 (L) 135 - 145 mmol/L   Potassium 2.7 (LL) 3.5 - 5.1 mmol/L   Chloride 97 (L) 98 - 111 mmol/L   CO2 23 22 - 32 mmol/L   Glucose, Bld 192 (H) 70 - 99 mg/dL   BUN 8 6 - 20 mg/dL   Creatinine, Ser 9.21 0.44 - 1.00 mg/dL   Calcium 9.1 8.9 - 19.4 mg/dL   GFR calc non Af Amer >60 >60 mL/min   GFR calc Af Amer >60 >60 mL/min   Anion gap 13 5 - 15  CBC  Result Value Ref Range   WBC 8.5 4.0 - 10.5  K/uL   RBC 3.76 (L) 3.87 - 5.11 MIL/uL   Hemoglobin 11.9 (L) 12.0 - 15.0 g/dL   HCT 17.4 08.1 - 44.8 %   MCV 98.9 80.0 - 100.0 fL   MCH 31.6 26.0 - 34.0 pg   MCHC 32.0 30.0 - 36.0 g/dL   RDW 18.5 63.1 - 49.7 %   Platelets 159 150 - 400 K/uL   nRBC 0.0 0.0 - 0.2 %  I-Stat beta hCG blood, ED  Result Value Ref Range   I-stat hCG, quantitative <5.0 <5 mIU/mL   Comment 3          Troponin I (High Sensitivity)  Result Value Ref Range   Troponin I (High Sensitivity) 5 <18 ng/L  Troponin I (High Sensitivity)  Result Value Ref Range   Troponin I (High Sensitivity) 4 <18 ng/L      Assessment & Plan:   Problem List Items Addressed This Visit   None   Visit Diagnoses    Screening for STD (sexually transmitted disease)    -  Primary   Will make recommendations based on lab results.   Relevant Orders   CBC w/Diff   Chlamydia/Gonococcus/Trichomonas, NAA([facility])   HIV Antibody (routine testing w rflx)   Hepatitis C Antibody   RPR   HSV(herpes smplx)abs-1+2(IgG+IgM)-bld   Screening for HIV (human immunodeficiency virus)       Relevant Orders   HIV Antibody (routine testing w rflx)   Encounter for hepatitis C screening test for low risk patient       Relevant Orders   Hepatitis C Antibody   Encounter to establish care       Return in 3 months for physical and fasting labs.       Follow up plan: Return in about 3 months (around 02/11/2021) for Physical and Fasting labs (and pap).   A total of 30 minutes were spent on this encounter today.  When total time is documented, this includes both the face-to-face and non-face-to-face time personally spent before, during and after the visit on the date of the encounter.

## 2020-11-12 NOTE — Progress Notes (Signed)
Hi Courtney Adams.  It was a pleasure meeting you yesterday.  Your lab work looks good.  You are negative for HIV, Hepatitis C, Syphilis, and Oral herpes.  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 For this, you can take valacyclovir at the first sign of an outbreak.  But it is a virus so it will not go away completely.  We can talk about this further at our next appointment.  The Gonorrhea, chlamydia and trichomonas aren't back yet but I will send you another message when it is.  Please let me know if you have any questions.

## 2020-11-13 LAB — CBC WITH DIFFERENTIAL/PLATELET
Basophils Absolute: 0 10*3/uL (ref 0.0–0.2)
Basos: 1 %
EOS (ABSOLUTE): 0.1 10*3/uL (ref 0.0–0.4)
Eos: 4 %
Hematocrit: 35.7 % (ref 34.0–46.6)
Hemoglobin: 11.9 g/dL (ref 11.1–15.9)
Immature Grans (Abs): 0 10*3/uL (ref 0.0–0.1)
Immature Granulocytes: 0 %
Lymphocytes Absolute: 1.3 10*3/uL (ref 0.7–3.1)
Lymphs: 46 %
MCH: 31.8 pg (ref 26.6–33.0)
MCHC: 33.3 g/dL (ref 31.5–35.7)
MCV: 96 fL (ref 79–97)
Monocytes Absolute: 0.1 10*3/uL (ref 0.1–0.9)
Monocytes: 5 %
Neutrophils Absolute: 1.3 10*3/uL — ABNORMAL LOW (ref 1.4–7.0)
Neutrophils: 44 %
Platelets: 194 10*3/uL (ref 150–450)
RBC: 3.74 x10E6/uL — ABNORMAL LOW (ref 3.77–5.28)
RDW: 11.5 % — ABNORMAL LOW (ref 11.7–15.4)
WBC: 2.9 10*3/uL — ABNORMAL LOW (ref 3.4–10.8)

## 2020-11-13 LAB — HSV(HERPES SMPLX)ABS-I+II(IGG+IGM)-BLD
HSV 1 Glycoprotein G Ab, IgG: <0.91 index (ref 0.00–0.90)
HSV 2 IgG, Type Spec: 7.02 index — ABNORMAL HIGH (ref 0.00–0.90)
HSVI/II Comb IgM: 1.53 Ratio — ABNORMAL HIGH (ref 0.00–0.90)

## 2020-11-13 LAB — CHLAMYDIA/GONOCOCCUS/TRICHOMONAS, NAA
Chlamydia by NAA: NEGATIVE
Gonococcus by NAA: NEGATIVE
Trich vag by NAA: NEGATIVE

## 2020-11-13 LAB — HIV ANTIBODY (ROUTINE TESTING W REFLEX): HIV Screen 4th Generation wRfx: NONREACTIVE

## 2020-11-13 LAB — RPR: RPR Ser Ql: NONREACTIVE

## 2020-11-13 LAB — HEPATITIS C ANTIBODY: Hep C Virus Ab: <0.1 s/co ratio (ref 0.0–0.9)

## 2020-11-13 NOTE — Progress Notes (Signed)
Hi Courtney Adams.  No evidence of Chlamydia, Gonorrhea, or Trichomonas.

## 2021-01-14 ENCOUNTER — Other Ambulatory Visit: Payer: Self-pay

## 2021-01-14 ENCOUNTER — Encounter: Payer: Self-pay | Admitting: Nurse Practitioner

## 2021-01-14 ENCOUNTER — Ambulatory Visit: Payer: Medicaid Other | Admitting: Nurse Practitioner

## 2021-01-14 VITALS — BP 132/82 | HR 84 | Temp 99.1°F | Resp 18 | Wt 108.4 lb

## 2021-01-14 DIAGNOSIS — Z113 Encounter for screening for infections with a predominantly sexual mode of transmission: Secondary | ICD-10-CM

## 2021-01-14 DIAGNOSIS — F411 Generalized anxiety disorder: Secondary | ICD-10-CM

## 2021-01-14 DIAGNOSIS — N898 Other specified noninflammatory disorders of vagina: Secondary | ICD-10-CM | POA: Diagnosis not present

## 2021-01-14 LAB — WET PREP FOR TRICH, YEAST, CLUE
Clue Cell Exam: POSITIVE — AB
Trichomonas Exam: NEGATIVE
Yeast Exam: NEGATIVE

## 2021-01-14 MED ORDER — ESCITALOPRAM OXALATE 10 MG PO TABS
10.0000 mg | ORAL_TABLET | Freq: Every day | ORAL | 0 refills | Status: DC
Start: 2021-01-14 — End: 2023-09-12

## 2021-01-14 NOTE — Progress Notes (Signed)
Acute Office Visit  Subjective:    Patient ID: Courtney Adams, female    DOB: [birth year], 35 y.o.   MRN: [id number]  Chief Complaint  Patient presents with   Anxiety    Has been feeling anxious for the last month   Exposure to STD    Would like to be screened for STDs    HPI Patient is in today for anxiety and stomach pain most likely from her anxiety. She has had anxiety for several years, however it has worsened over the last month. She would also like to be screened for STDs.   ANXIETY/STRESS  Duration:exacerbated Anxious mood: yes  Excessive worrying: yes Irritability: no  Sweating: no Nausea: yes Palpitations:yes Hyperventilation: no Panic attacks: no Agoraphobia: no Obscessions/compulsions: no Depressed mood: no Depression screen [facility] 2/9 01/14/2021 11/11/2020 06/23/2017 06/09/2017 05/25/2017  Decreased Interest 1 0 0 0 0  Down, Depressed, Hopeless 1 0 0 0 0  PHQ - 2 Score 2 0 0 0 0  Altered sleeping 3 - - - -  Tired, decreased energy 2 - - - -  Change in appetite 3 - - - -  Feeling bad or failure about yourself  0 - - - -  Trouble concentrating 1 - - - -  Moving slowly or fidgety/restless 0 - - - -  Suicidal thoughts 0 - - - -  PHQ-9 Score 11 - - - -  Difficult doing work/chores Not difficult at all - - - -   GAD 7 : Generalized Anxiety Score 01/14/2021 12/22/2016  Nervous, Anxious, on Edge 3 3  Control/stop worrying 3 3  Worry too much - different things 3 3  Trouble relaxing 3 3  Restless 3 3  Easily annoyed or irritable 1 3  Afraid - awful might happen 1 0  Total GAD 7 Score 17 18  Anxiety Difficulty Not difficult at all Not difficult at all    Anhedonia: no Weight changes: no Insomnia: yes hard to fall asleep  Hypersomnia: yes Fatigue/loss of energy: yes Feelings of worthlessness: no Feelings of guilt: no Impaired concentration/indecisiveness: no Suicidal ideations: no  Crying spells: no Recent Stressors/Life Changes: yes   Relationship  problems: no Raising 2 boys alone, starting a new job   Past Medical History:  Diagnosis Date   Abortion    Anemia    Anxiety    Anxiety    Bipolar 1 disorder (HCC)    BV (bacterial vaginosis)    Chlamydia    Gonorrhea    Headache    Infection    UTI   Pneumonia    Preterm delivery    Preterm labor    Trichomonas    Vaginal Pap smear, abnormal     Past Surgical History:  Procedure Laterality Date   DILATION AND CURETTAGE OF UTERUS      Family History  Problem Relation Age of Onset   Anemia Mother    Varicose Veins Mother    Cervical cancer Sister    Cancer Sister    Diabetes Maternal Grandmother    Heart disease Paternal Grandmother    Heart disease Paternal Grandfather    Stroke Paternal Grandfather    Stroke Paternal Uncle     Social History   Socioeconomic History   Marital status: Single    Spouse name: Not on file   Number of children: 2   Years of education: Not on file   Highest education level: Associate degree: academic program  Occupational History  Occupation: unemployed  Tobacco Use   Smoking status: Former    Types: Cigarettes    Quit date: 02/20/2017    Years since quitting: 3.9   Smokeless tobacco: Never  Vaping Use   Vaping Use: Never used  Substance and Sexual Activity   Alcohol use: No    Alcohol/week: 0.0 standard drinks   Drug use: Yes    Frequency: 5.0 times per week    Types: Marijuana   Sexual activity: Yes    Partners: Male    Birth control/protection: None  Other Topics Concern   Not on file  Social History Narrative   Patient is well adjusted.   Social Determinants of Health   Financial Resource Strain: Not on file  Food Insecurity: Not on file  Transportation Needs: Not on file  Physical Activity: Not on file  Stress: Not on file  Social Connections: Not on file  Intimate Partner Violence: Not on file    Outpatient Medications Prior to Visit  Medication Sig Dispense Refill   metroNIDAZOLE (METROGEL) 0.75  % vaginal gel Place vaginally 2 (two) times a week.     No facility-administered medications prior to visit.    No Known Allergies  Review of Systems  Constitutional:  Positive for fatigue.  HENT: Negative.    Respiratory: Negative.    Cardiovascular: Negative.   Gastrointestinal: Negative.   Endocrine: Negative.   Genitourinary:        Vaginal odor  Skin: Negative.   Neurological: Negative.   Psychiatric/Behavioral:  The patient is nervous/anxious.       Objective:    Physical Exam Vitals and nursing note reviewed.  Constitutional:      General: She is not in acute distress.    Appearance: Normal appearance.  HENT:     Head: Normocephalic.  Eyes:     Conjunctiva/sclera: Conjunctivae normal.  Cardiovascular:     Rate and Rhythm: Normal rate and regular rhythm.     Pulses: Normal pulses.     Heart sounds: Normal heart sounds.  Pulmonary:     Effort: Pulmonary effort is normal.     Breath sounds: Normal breath sounds.  Musculoskeletal:     Cervical back: Normal range of motion and neck supple. No tenderness.  Skin:    General: Skin is warm.  Neurological:     General: No focal deficit present.     Mental Status: She is alert and oriented to person, place, and time.  Psychiatric:        Mood and Affect: Mood normal.        Behavior: Behavior normal.        Thought Content: Thought content normal.        Judgment: Judgment normal.    BP 132/82 (BP Location: Right Arm, Patient Position: Sitting)   Pulse 84   Temp 99.1 F (37.3 C)   Resp 18   Wt 108 lb 6.4 oz (49.2 kg)   LMP 12/31/2020   SpO2 97%   BMI 16.62 kg/m  Wt Readings from Last 3 Encounters:  01/14/21 108 lb 6.4 oz (49.2 kg)  11/11/20 114 lb 8 oz (51.9 kg)  01/02/20 105 lb 13.1 oz (48 kg)    Health Maintenance Due  Topic Date Due   COVID-19 Vaccine (1) Never done   INFLUENZA VACCINE  01/12/2021   PAP SMEAR-Modifier  01/19/2021    There are no preventive care reminders to display for this  patient.   Lab Results  Component Value Date  TSH 0.192 (L) 06/23/2017   Lab Results  Component Value Date   WBC 2.9 (L) 11/11/2020   HGB 11.9 11/11/2020   HCT 35.7 11/11/2020   MCV 96 11/11/2020   PLT 194 11/11/2020   Lab Results  Component Value Date   NA 133 (L) 01/03/2020   K 2.7 (LL) 01/03/2020   CO2 23 01/03/2020   GLUCOSE 192 (H) 01/03/2020   BUN 8 01/03/2020   CREATININE 0.88 01/03/2020   BILITOT 0.7 10/01/2013   ALKPHOS 53 10/01/2013   AST 16 10/01/2013   ALT 13 10/01/2013   PROT 7.7 10/01/2013   ALBUMIN 3.9 10/01/2013   CALCIUM 9.1 01/03/2020   ANIONGAP 13 01/03/2020   No results found for: CHOL No results found for: HDL No results found for: LDLCALC No results found for: TRIG No results found for: CHOLHDL No results found for: UDJS9F     Assessment & Plan:   Problem List Items Addressed This Visit       Other   GAD (generalized anxiety disorder)    Ongoing for years, however worsened recently over the last month. Her PHQ 9 is 11 and GAD 7 is 17. She has tried buspar, ativan, klonopin, and hydroxyzine in the past. She smokes marijuana almost daily. Will start lexapro 10mg  daily. Instructed if she develops SI/HI, call office or go to ER immediately. Will also check TSH as it was low in 2019 which may be contributing to the symptoms. Keep appointment with PCP on 02/17/21.        Relevant Medications   escitalopram (LEXAPRO) 10 MG tablet   Other Relevant Orders   Thyroid Panel With TSH   Other Visit Diagnoses     Screen for STD (sexually transmitted disease)    -  Primary   Will screen for STI per request. Encouraged safe sex practices   Relevant Orders   HIV Antibody (routine testing w rflx)   RPR   HSV(herpes simplex vrs) 1+2 ab-IgG   Acute Viral Hepatitis (HAV, HBV, HCV)   Chlamydia/GC NAA, Confirmation   Vaginal odor       Will check wet prep and treat as needed   Relevant Orders   WET PREP FOR TRICH, YEAST, CLUE        Meds ordered  this encounter  Medications   escitalopram (LEXAPRO) 10 MG tablet    Sig: Take 1 tablet (10 mg total) by mouth daily.    Dispense:  30 tablet    Refill:  0      04/19/21, NP

## 2021-01-14 NOTE — Assessment & Plan Note (Signed)
Ongoing for years, however worsened recently over the last month. Her PHQ 9 is 11 and GAD 7 is 17. She has tried buspar, ativan, klonopin, and hydroxyzine in the past. She smokes marijuana almost daily. Will start lexapro 10mg  daily. Instructed if she develops SI/HI, call office or go to ER immediately. Will also check TSH as it was low in 2019 which may be contributing to the symptoms. Keep appointment with PCP on 02/17/21.

## 2021-01-15 MED ORDER — METRONIDAZOLE 0.75 % VA GEL: 1.0000 | Freq: Every day | VAGINAL | 0 refills | Status: DC

## 2021-01-15 MED ORDER — FLUCONAZOLE 150 MG PO TABS
150.0000 mg | ORAL_TABLET | Freq: Once | ORAL | 0 refills | Status: AC
Start: 2021-01-15 — End: 2021-01-15

## 2021-01-15 MED ORDER — METRONIDAZOLE 500 MG PO TABS
500.0000 mg | ORAL_TABLET | Freq: Two times a day (BID) | ORAL | 0 refills | Status: DC
Start: 2021-01-15 — End: 2021-01-15

## 2021-01-15 NOTE — Addendum Note (Signed)
Addended by: Rodman Pickle A on: 01/15/2021 11:27 AM   Modules accepted: Orders

## 2021-01-15 NOTE — Addendum Note (Signed)
Addended by: Rodman Pickle A on: 01/15/2021 08:18 AM   Modules accepted: Orders

## 2021-01-17 LAB — CHLAMYDIA/GC NAA, CONFIRMATION
Chlamydia trachomatis, NAA: NEGATIVE
Neisseria gonorrhoeae, NAA: NEGATIVE

## 2021-01-17 LAB — ACUTE VIRAL HEPATITIS (HAV, HBV, HCV)
HCV Ab: <0.1 s/co ratio (ref 0.0–0.9)
Hep A IgM: NEGATIVE
Hep B C IgM: NEGATIVE
Hepatitis B Surface Ag: NEGATIVE

## 2021-01-17 LAB — THYROID PANEL WITH TSH
Free Thyroxine Index: 1.4 (ref 1.2–4.9)
T3 Uptake Ratio: 25 % (ref 24–39)
T4, Total: 5.5 ug/dL (ref 4.5–12.0)
TSH: 0.586 u[IU]/mL (ref 0.450–4.500)

## 2021-01-17 LAB — HSV(HERPES SIMPLEX VRS) I + II AB-IGG
HSV 1 Glycoprotein G Ab, IgG: <0.91 index (ref 0.00–0.90)
HSV 2 IgG, Type Spec: 7.58 index — ABNORMAL HIGH (ref 0.00–0.90)

## 2021-01-17 LAB — HCV INTERPRETATION

## 2021-01-17 LAB — HIV ANTIBODY (ROUTINE TESTING W REFLEX): HIV Screen 4th Generation wRfx: NONREACTIVE

## 2021-01-17 LAB — RPR: RPR Ser Ql: NONREACTIVE

## 2021-02-17 ENCOUNTER — Encounter: Payer: Medicaid Other | Admitting: Nurse Practitioner

## 2021-02-17 NOTE — Progress Notes (Deleted)
There were no vitals taken for this visit.   Subjective:    Patient ID: Courtney Adams, female    DOB: 01/01/1986, 35 y.o.   MRN: [id number]  HPI: Courtney Adams is a 35 y.o. female presenting on 02/17/2021 for comprehensive medical examination. Current medical complaints include:{Blank single:19197::"none","***"}  She currently lives with: Menopausal Symptoms: {Blank single:19197::"yes","no"}  Functional Status Survey:    Fall Risk  11/11/2020 06/23/2017 04/14/2017 01/26/2017 12/01/2016  Falls in the past year? 0 No No No No  Number falls in past yr: 0 - - - -  Injury with Fall? 0 - - - -  Risk for fall due to : No Fall Risks - - - -  Follow up Falls evaluation completed - - - -    Depression Screen Depression screen [facility] 2/9 01/14/2021 11/11/2020 06/23/2017 06/09/2017 05/25/2017  Decreased Interest 1 0 0 0 0  Down, Depressed, Hopeless 1 0 0 0 0  PHQ - 2 Score 2 0 0 0 0  Altered sleeping 3 - - - -  Tired, decreased energy 2 - - - -  Change in appetite 3 - - - -  Feeling bad or failure about yourself  0 - - - -  Trouble concentrating 1 - - - -  Moving slowly or fidgety/restless 0 - - - -  Suicidal thoughts 0 - - - -  PHQ-9 Score 11 - - - -  Difficult doing work/chores Not difficult at all - - - -     Advanced Directives Does patient have a HCPOA?    {Blank single:19197::"yes","no"} If yes, name and contact information:  Does patient have a living will or MOST form?  {Blank single:19197::"yes","no"}  Past Medical History:  Past Medical History:  Diagnosis Date   Abortion    Anemia    Anxiety    Anxiety    Bipolar 1 disorder (HCC)    BV (bacterial vaginosis)    Chlamydia    Gonorrhea    Headache    Infection    UTI   Pneumonia    Preterm delivery    Preterm labor    Trichomonas    Vaginal Pap smear, abnormal     Surgical History:  Past Surgical History:  Procedure Laterality Date   DILATION AND CURETTAGE OF UTERUS      Medications:  Current Outpatient  Medications on File Prior to Visit  Medication Sig   escitalopram (LEXAPRO) 10 MG tablet Take 1 tablet (10 mg total) by mouth daily.   metroNIDAZOLE (METROGEL) 0.75 % vaginal gel Place vaginally 2 (two) times a week.   metroNIDAZOLE (METROGEL) 0.75 % vaginal gel Place 1 Applicatorful vaginally at bedtime.   [DISCONTINUED] fluticasone (FLONASE) 50 MCG/ACT nasal spray Place 2 sprays into both nostrils daily. (Patient not taking: Reported on 05/25/2017)   [DISCONTINUED] omeprazole (PRILOSEC) 20 MG capsule Take 1 capsule (20 mg total) by mouth daily. (Patient not taking: Reported on 01/19/2018)   No current facility-administered medications on file prior to visit.    Allergies:  No Known Allergies  Social History:  Social History   Socioeconomic History   Marital status: Single    Spouse name: Not on file   Number of children: 2   Years of education: Not on file   Highest education level: Associate degree: academic program  Occupational History   Occupation: unemployed  Tobacco Use   Smoking status: Former    Types: Cigarettes    Quit date: 02/20/2017  Years since quitting: 3.9   Smokeless tobacco: Never  Vaping Use   Vaping Use: Never used  Substance and Sexual Activity   Alcohol use: No    Alcohol/week: 0.0 standard drinks   Drug use: Yes    Frequency: 5.0 times per week    Types: Marijuana   Sexual activity: Yes    Partners: Male    Birth control/protection: None  Other Topics Concern   Not on file  Social History Narrative   Patient is well adjusted.   Social Determinants of Health   Financial Resource Strain: Not on file  Food Insecurity: Not on file  Transportation Needs: Not on file  Physical Activity: Not on file  Stress: Not on file  Social Connections: Not on file  Intimate Partner Violence: Not on file   Social History   Tobacco Use  Smoking Status Former   Types: Cigarettes   Quit date: 02/20/2017   Years since quitting: 3.9  Smokeless Tobacco Never    Social History   Substance and Sexual Activity  Alcohol Use No   Alcohol/week: 0.0 standard drinks    Family History:  Family History  Problem Relation Age of Onset   Anemia Mother    Varicose Veins Mother    Cervical cancer Sister    Cancer Sister    Diabetes Maternal Grandmother    Heart disease Paternal Grandmother    Heart disease Paternal Grandfather    Stroke Paternal Grandfather    Stroke Paternal Uncle     Past medical history, surgical history, medications, allergies, family history and social history reviewed with patient today and changes made to appropriate areas of the chart.   ROS  All other ROS negative except what is listed above and in the HPI.      Objective:    There were no vitals taken for this visit.  Wt Readings from Last 3 Encounters:  01/14/21 108 lb 6.4 oz (49.2 kg)  11/11/20 114 lb 8 oz (51.9 kg)  01/02/20 105 lb 13.1 oz (48 kg)    No results found.  Physical Exam  No flowsheet data found.  Cognitive Testing - 6-CIT  Correct? Score   What year is it? {YES NO:22349} {Numbers; 0-4:31231} Yes = 0    No = 4  What month is it? {YES NO:22349} {Numbers; 0-4:31231} Yes = 0    No = 3  Remember:     Floyde ParkinsJohn Smith, [address] [place], [place]     What time is it? {YES NO:22349} {Numbers; 0-4:31231} Yes = 0    No = 3  Count backwards from 20 to 1 {YES NO:22349} {Numbers; 0-4:31231} Correct = 0    1 error = 2   More than 1 error = 4  Say the months of the year in reverse. {YES NO:22349} {Numbers; 0-4:31231} Correct = 0    1 error = 2   More than 1 error = 4  What address did I ask you to remember? {YES NO:22349} {NUMBERS; 0-10:5044} Correct = 0  1 error = 2    2 error = 4    3 error = 6    4 error = 8    All wrong = 10       TOTAL SCORE  {Numbers; 1-61:09604}/540-30:31392}/28   Interpretation:  {Desc; normal/abnormal:11317::"Normal"}  Normal (0-7) Abnormal (8-28)   Results for orders placed or performed in visit on 01/14/21  Chlamydia/GC NAA, Confirmation    Specimen: Urine   UR  Result Value Ref Range   Chlamydia trachomatis, NAA Negative Negative   Neisseria gonorrhoeae, NAA Negative Negative  WET PREP FOR TRICH, YEAST, CLUE   Specimen: Sterile Swab   Sterile Swab  Result Value Ref Range   Trichomonas Exam Negative Negative   Yeast Exam Negative Negative   Clue Cell Exam Positive (A) Negative  HIV Antibody (routine testing w rflx)  Result Value Ref Range   HIV Screen 4th Generation wRfx Non Reactive Non Reactive  RPR  Result Value Ref Range   RPR Ser Ql Non Reactive Non Reactive  HSV(herpes simplex vrs) 1+2 ab-IgG  Result Value Ref Range   HSV 1 Glycoprotein G Ab, IgG <0.91 0.00 - 0.90 index   HSV 2 IgG, Type Spec 7.58 (H) 0.00 - 0.90 index  Acute Viral Hepatitis (HAV, HBV, HCV)  Result Value Ref Range   Hep A IgM Negative Negative   Hepatitis B Surface Ag Negative Negative   Hep B C IgM Negative Negative   HCV Ab <0.1 0.0 - 0.9 s/co ratio  Thyroid Panel With TSH  Result Value Ref Range   TSH 0.586 0.450 - 4.500 uIU/mL   T4, Total 5.5 4.5 - 12.0 ug/dL   T3 Uptake Ratio 25 24 - 39 %   Free Thyroxine Index 1.4 1.2 - 4.9  Interpretation:  Result Value Ref Range   HCV Interp 1: Comment       Assessment & Plan:   Problem List Items Addressed This Visit   None Visit Diagnoses     Annual physical exam    -  Primary   Screening for cervical cancer            Preventative Services:  AAA screening:  Health Risk Assessment and Personalized Prevention Plan: Bone Mass Measurements: Breast Cancer Screening: CVD Screening:  Cervical Cancer Screening: Colon Cancer Screening:  Depression Screening:  Diabetes Screening:  Glaucoma Screening:  Hepatitis B vaccine: Hepatitis C screening:  HIV Screening: Flu Vaccine: Lung cancer Screening: Obesity Screening:  Pneumonia Vaccines (2): STI Screening:  Follow up plan: No follow-ups on file.   LABORATORY TESTING:  - Pap smear: {Blank single:19197::"pap done","not  applicable","up to date","done elsewhere"}  IMMUNIZATIONS:   - Tdap: Tetanus vaccination status reviewed: last tetanus booster within 10 years. - Influenza: Postponed to flu season - Pneumovax: Not applicable - Prevnar: Not applicable - Zostavax vaccine: Not applicable  SCREENING: -Mammogram: Not applicable  - Colonoscopy: Not applicable  - Bone Density: Not applicable  -Hearing Test: Not applicable  -Spirometry: Up to date   PATIENT COUNSELING:   Advised to take 1 mg of folate supplement per day if capable of pregnancy.   Sexuality: Discussed sexually transmitted diseases, partner selection, use of condoms, avoidance of unintended pregnancy  and contraceptive alternatives.   Advised to avoid cigarette smoking.  I discussed with the patient that most people either abstain from alcohol or drink within safe limits (<=14/week and <=4 drinks/occasion for males, <=7/weeks and <= 3 drinks/occasion for females) and that the risk for alcohol disorders and other health effects rises proportionally with the number of drinks per week and how often a drinker exceeds daily limits.  Discussed cessation/primary prevention of drug use and availability of treatment for abuse.   Diet: Encouraged to adjust caloric intake to maintain  or achieve ideal body weight, to reduce intake of dietary saturated fat and total fat, to limit sodium intake by avoiding high sodium foods and not adding table salt, and to maintain adequate dietary  potassium and calcium preferably from fresh fruits, vegetables, and low-fat dairy products.    stressed the importance of regular exercise  Injury prevention: Discussed safety belts, safety helmets, smoke detector, smoking near bedding or upholstery.   Dental health: Discussed importance of regular tooth brushing, flossing, and dental visits.    NEXT PREVENTATIVE PHYSICAL DUE IN 1 YEAR. No follow-ups on file.

## 2021-03-09 DIAGNOSIS — B373 Candidiasis of vulva and vagina: Secondary | ICD-10-CM | POA: Diagnosis not present

## 2021-03-11 DIAGNOSIS — Z13220 Encounter for screening for lipoid disorders: Secondary | ICD-10-CM | POA: Diagnosis not present

## 2021-03-11 DIAGNOSIS — Z124 Encounter for screening for malignant neoplasm of cervix: Secondary | ICD-10-CM | POA: Diagnosis not present

## 2021-03-11 DIAGNOSIS — Z0000 Encounter for general adult medical examination without abnormal findings: Secondary | ICD-10-CM | POA: Diagnosis not present

## 2021-03-11 DIAGNOSIS — Z113 Encounter for screening for infections with a predominantly sexual mode of transmission: Secondary | ICD-10-CM | POA: Diagnosis not present

## 2021-03-11 DIAGNOSIS — Z13228 Encounter for screening for other metabolic disorders: Secondary | ICD-10-CM | POA: Diagnosis not present

## 2021-03-11 DIAGNOSIS — Z130 Encounter for screening for diseases of the blood and blood-forming organs and certain disorders involving the immune mechanism: Secondary | ICD-10-CM | POA: Diagnosis not present

## 2021-03-11 DIAGNOSIS — B373 Candidiasis of vulva and vagina: Secondary | ICD-10-CM | POA: Diagnosis not present

## 2021-03-11 DIAGNOSIS — R7989 Other specified abnormal findings of blood chemistry: Secondary | ICD-10-CM | POA: Diagnosis not present

## 2021-05-13 DIAGNOSIS — Z113 Encounter for screening for infections with a predominantly sexual mode of transmission: Secondary | ICD-10-CM | POA: Diagnosis not present

## 2021-05-22 DIAGNOSIS — G43011 Migraine without aura, intractable, with status migrainosus: Secondary | ICD-10-CM | POA: Diagnosis not present

## 2021-05-22 DIAGNOSIS — Z124 Encounter for screening for malignant neoplasm of cervix: Secondary | ICD-10-CM | POA: Diagnosis not present

## 2021-05-22 DIAGNOSIS — R630 Anorexia: Secondary | ICD-10-CM | POA: Diagnosis not present

## 2021-05-22 DIAGNOSIS — Z8049 Family history of malignant neoplasm of other genital organs: Secondary | ICD-10-CM | POA: Diagnosis not present

## 2021-05-22 DIAGNOSIS — N644 Mastodynia: Secondary | ICD-10-CM | POA: Diagnosis not present

## 2021-06-03 DIAGNOSIS — Z1231 Encounter for screening mammogram for malignant neoplasm of breast: Secondary | ICD-10-CM | POA: Diagnosis not present

## 2021-06-23 DIAGNOSIS — G43109 Migraine with aura, not intractable, without status migrainosus: Secondary | ICD-10-CM | POA: Diagnosis not present

## 2021-06-26 DIAGNOSIS — G43011 Migraine without aura, intractable, with status migrainosus: Secondary | ICD-10-CM | POA: Diagnosis not present

## 2021-07-17 DIAGNOSIS — R519 Headache, unspecified: Secondary | ICD-10-CM | POA: Diagnosis not present

## 2021-07-22 DIAGNOSIS — R930 Abnormal findings on diagnostic imaging of skull and head, not elsewhere classified: Secondary | ICD-10-CM | POA: Diagnosis not present

## 2021-07-22 DIAGNOSIS — G43011 Migraine without aura, intractable, with status migrainosus: Secondary | ICD-10-CM | POA: Diagnosis not present

## 2021-07-23 DIAGNOSIS — G43011 Migraine without aura, intractable, with status migrainosus: Secondary | ICD-10-CM | POA: Diagnosis not present

## 2021-07-23 DIAGNOSIS — R930 Abnormal findings on diagnostic imaging of skull and head, not elsewhere classified: Secondary | ICD-10-CM | POA: Diagnosis not present

## 2021-10-09 DIAGNOSIS — G43109 Migraine with aura, not intractable, without status migrainosus: Secondary | ICD-10-CM | POA: Diagnosis not present

## 2021-10-09 DIAGNOSIS — F411 Generalized anxiety disorder: Secondary | ICD-10-CM | POA: Diagnosis not present

## 2021-10-14 DIAGNOSIS — N898 Other specified noninflammatory disorders of vagina: Secondary | ICD-10-CM | POA: Diagnosis not present

## 2021-12-09 DIAGNOSIS — N898 Other specified noninflammatory disorders of vagina: Secondary | ICD-10-CM | POA: Diagnosis not present

## 2021-12-28 DIAGNOSIS — G43011 Migraine without aura, intractable, with status migrainosus: Secondary | ICD-10-CM | POA: Diagnosis not present

## 2022-01-07 DIAGNOSIS — G43011 Migraine without aura, intractable, with status migrainosus: Secondary | ICD-10-CM | POA: Diagnosis not present

## 2022-01-07 DIAGNOSIS — R232 Flushing: Secondary | ICD-10-CM | POA: Diagnosis not present

## 2022-03-22 DIAGNOSIS — J342 Deviated nasal septum: Secondary | ICD-10-CM | POA: Diagnosis not present

## 2022-03-22 DIAGNOSIS — J3489 Other specified disorders of nose and nasal sinuses: Secondary | ICD-10-CM | POA: Diagnosis not present

## 2022-03-23 DIAGNOSIS — R42 Dizziness and giddiness: Secondary | ICD-10-CM | POA: Diagnosis not present

## 2022-03-23 DIAGNOSIS — G43011 Migraine without aura, intractable, with status migrainosus: Secondary | ICD-10-CM | POA: Diagnosis not present

## 2022-03-23 DIAGNOSIS — Z113 Encounter for screening for infections with a predominantly sexual mode of transmission: Secondary | ICD-10-CM | POA: Diagnosis not present

## 2022-03-23 DIAGNOSIS — I951 Orthostatic hypotension: Secondary | ICD-10-CM | POA: Diagnosis not present

## 2022-04-07 DIAGNOSIS — G43011 Migraine without aura, intractable, with status migrainosus: Secondary | ICD-10-CM | POA: Diagnosis not present

## 2022-04-07 DIAGNOSIS — R0789 Other chest pain: Secondary | ICD-10-CM | POA: Diagnosis not present

## 2022-04-07 DIAGNOSIS — J3489 Other specified disorders of nose and nasal sinuses: Secondary | ICD-10-CM | POA: Diagnosis not present

## 2022-04-07 DIAGNOSIS — R051 Acute cough: Secondary | ICD-10-CM | POA: Diagnosis not present

## 2022-05-12 DIAGNOSIS — B9689 Other specified bacterial agents as the cause of diseases classified elsewhere: Secondary | ICD-10-CM | POA: Diagnosis not present

## 2022-05-12 DIAGNOSIS — N760 Acute vaginitis: Secondary | ICD-10-CM | POA: Diagnosis not present

## 2022-05-12 DIAGNOSIS — N898 Other specified noninflammatory disorders of vagina: Secondary | ICD-10-CM | POA: Diagnosis not present

## 2022-05-31 DIAGNOSIS — D72819 Decreased white blood cell count, unspecified: Secondary | ICD-10-CM | POA: Diagnosis not present

## 2022-05-31 DIAGNOSIS — R5383 Other fatigue: Secondary | ICD-10-CM | POA: Diagnosis not present

## 2022-05-31 DIAGNOSIS — Z113 Encounter for screening for infections with a predominantly sexual mode of transmission: Secondary | ICD-10-CM | POA: Diagnosis not present

## 2022-05-31 DIAGNOSIS — Z8669 Personal history of other diseases of the nervous system and sense organs: Secondary | ICD-10-CM | POA: Diagnosis not present

## 2022-05-31 DIAGNOSIS — I959 Hypotension, unspecified: Secondary | ICD-10-CM | POA: Diagnosis not present

## 2022-06-28 DIAGNOSIS — R0989 Other specified symptoms and signs involving the circulatory and respiratory systems: Secondary | ICD-10-CM | POA: Diagnosis not present

## 2022-06-28 DIAGNOSIS — I959 Hypotension, unspecified: Secondary | ICD-10-CM | POA: Diagnosis not present

## 2022-06-28 DIAGNOSIS — G43119 Migraine with aura, intractable, without status migrainosus: Secondary | ICD-10-CM | POA: Diagnosis not present

## 2022-06-28 DIAGNOSIS — R002 Palpitations: Secondary | ICD-10-CM | POA: Diagnosis not present

## 2022-10-15 DIAGNOSIS — Z124 Encounter for screening for malignant neoplasm of cervix: Secondary | ICD-10-CM | POA: Diagnosis not present

## 2022-10-15 DIAGNOSIS — Z113 Encounter for screening for infections with a predominantly sexual mode of transmission: Secondary | ICD-10-CM | POA: Diagnosis not present

## 2022-12-10 DIAGNOSIS — B3731 Acute candidiasis of vulva and vagina: Secondary | ICD-10-CM | POA: Diagnosis not present

## 2023-01-12 DIAGNOSIS — R0789 Other chest pain: Secondary | ICD-10-CM | POA: Diagnosis not present

## 2023-01-12 DIAGNOSIS — R002 Palpitations: Secondary | ICD-10-CM | POA: Diagnosis not present

## 2023-01-31 DIAGNOSIS — R55 Syncope and collapse: Secondary | ICD-10-CM | POA: Diagnosis not present

## 2023-01-31 DIAGNOSIS — R002 Palpitations: Secondary | ICD-10-CM | POA: Diagnosis not present

## 2023-01-31 DIAGNOSIS — I5189 Other ill-defined heart diseases: Secondary | ICD-10-CM | POA: Diagnosis not present

## 2023-02-01 DIAGNOSIS — R55 Syncope and collapse: Secondary | ICD-10-CM | POA: Diagnosis not present

## 2023-02-01 DIAGNOSIS — R002 Palpitations: Secondary | ICD-10-CM | POA: Diagnosis not present

## 2023-02-01 DIAGNOSIS — I5189 Other ill-defined heart diseases: Secondary | ICD-10-CM | POA: Diagnosis not present

## 2023-02-14 ENCOUNTER — Other Ambulatory Visit: Payer: Self-pay

## 2023-02-14 ENCOUNTER — Encounter (HOSPITAL_COMMUNITY): Payer: Self-pay

## 2023-02-14 ENCOUNTER — Emergency Department (HOSPITAL_COMMUNITY)
Admission: EM | Admit: 2023-02-14 | Discharge: 2023-02-14 | Discharge status: Against Medical Advice | Payer: Medicaid Other

## 2023-02-14 ENCOUNTER — Emergency Department (HOSPITAL_COMMUNITY): Payer: Medicaid Other

## 2023-02-14 DIAGNOSIS — R059 Cough, unspecified: Secondary | ICD-10-CM | POA: Insufficient documentation

## 2023-02-14 DIAGNOSIS — R002 Palpitations: Secondary | ICD-10-CM | POA: Insufficient documentation

## 2023-02-14 DIAGNOSIS — R079 Chest pain, unspecified: Secondary | ICD-10-CM | POA: Diagnosis not present

## 2023-02-14 DIAGNOSIS — Z5329 Procedure and treatment not carried out because of patient's decision for other reasons: Secondary | ICD-10-CM | POA: Diagnosis not present

## 2023-02-14 LAB — BASIC METABOLIC PANEL
Anion gap: 7 (ref 5–15)
BUN: 10 mg/dL (ref 6–20)
CO2: 27 mmol/L (ref 22–32)
Calcium: 9 mg/dL (ref 8.9–10.3)
Chloride: 101 mmol/L (ref 98–111)
Creatinine, Ser: 0.74 mg/dL (ref 0.44–1.00)
GFR, Estimated: >60 mL/min (ref >60)
Glucose, Bld: 106 mg/dL — ABNORMAL HIGH (ref 70–99)
Potassium: 3.2 mmol/L — ABNORMAL LOW (ref 3.5–5.1)
Sodium: 135 mmol/L (ref 135–145)

## 2023-02-14 LAB — CBC
HCT: 38.2 % (ref 36.0–46.0)
Hemoglobin: 12.3 g/dL (ref 12.0–15.0)
MCH: 31.6 pg (ref 26.0–34.0)
MCHC: 32.2 g/dL (ref 30.0–36.0)
MCV: 98.2 fL (ref 80.0–100.0)
Platelets: 221 10*3/uL (ref 150–400)
RBC: 3.89 MIL/uL (ref 3.87–5.11)
RDW: 12 % (ref 11.5–15.5)
WBC: 2.7 10*3/uL — ABNORMAL LOW (ref 4.0–10.5)
nRBC: 0 % (ref 0.0–0.2)

## 2023-02-14 LAB — HCG, SERUM, QUALITATIVE: Preg, Serum: NEGATIVE

## 2023-02-14 LAB — TROPONIN I (HIGH SENSITIVITY): Troponin I (High Sensitivity): 2 ng/L (ref <18)

## 2023-02-14 MED ORDER — PREDNISONE 20 MG PO TABS: 60.0000 mg | ORAL_TABLET | Freq: Once | ORAL | Status: DC

## 2023-02-14 MED ORDER — IPRATROPIUM-ALBUTEROL 0.5-2.5 (3) MG/3ML IN SOLN: 3.0000 mL | Freq: Once | RESPIRATORY_TRACT | Status: DC

## 2023-02-14 NOTE — ED Triage Notes (Signed)
Palpitations that are worsening since last night. Pt states she has been having this issue since she was 12. Had heart monitor on from cardiology, was taken off Friday. Pt states her heart feels like it is beating out of rhythm and causing a headache and dizziness.

## 2023-02-14 NOTE — ED Provider Notes (Signed)
[place] EMERGENCY DEPARTMENT AT [facility] Provider Note   CSN: [id number] Arrival date & time: 02/14/23  1318     History {Add pertinent medical, surgical, social history, OB history to HPI:1} Chief Complaint  Patient presents with   Palpitations    Courtney Adams is a 37 y.o. female.  37 year old female with past medical history of asthma as well as recurrent bronchitis who does smoke presenting to the emergency department today with cough, chest discomfort, and shortness of breath.  The patient states this been going now for the past 3 days.  Patient also reports some nasal congestion.  She reports that she has been using albuterol at home which did seem to help a little.  She had an episode earlier today where she became very short of breath and thought that she was going to pass out.  She was subsequently brought to the emergency department for further evaluation.  The patient denies a history of DVT or pulmonary embolism, recent surgeries, recent travel.  She denies any new leg pain or swelling.  Denies any hemoptysis.  She is not on any estrogen-containing medications.  She states that the pain is in the center of her chest and hurts worse with coughing.  She reports she has been having a nonproductive cough over the past few days.  She came to the emergency department for further evaluation due to these worsening symptoms.  She reports that her significant other is also experiencing URI type symptoms.   Palpitations Associated symptoms: chest pain, cough and shortness of breath        Home Medications Prior to Admission medications   Medication Sig Start Date End Date Taking? Authorizing Provider  escitalopram (LEXAPRO) 10 MG tablet Take 1 tablet (10 mg total) by mouth daily. 01/14/21   McElwee, Lauren A, NP  metroNIDAZOLE (METROGEL) 0.75 % vaginal gel Place vaginally 2 (two) times a week. 10/28/20   [provider]  metroNIDAZOLE (METROGEL) 0.75 % vaginal  gel Place 1 Applicatorful vaginally at bedtime. 01/15/21   McElwee, Lauren A, NP  fluticasone (FLONASE) 50 MCG/ACT nasal spray Place 2 sprays into both nostrils daily. Patient not taking: Reported on 05/25/2017 04/14/17 01/01/20  Almon Hercules, MD  omeprazole (PRILOSEC) 20 MG capsule Take 1 capsule (20 mg total) by mouth daily. Patient not taking: Reported on 01/19/2018 06/09/17 01/01/20  Freddrick March, MD      Allergies    Patient has no known allergies.    Review of Systems   Review of Systems  Respiratory:  Positive for cough and shortness of breath.   Cardiovascular:  Positive for chest pain and palpitations.  All other systems reviewed and are negative.   Physical Exam Updated Vital Signs BP 123/85 (BP Location: Right Arm)   Pulse 79   Temp 98.1 F (36.7 C) (Oral)   Resp 16   Ht 5\' 7"  (1.702 m)   Wt 50.8 kg   SpO2 100%   BMI 17.54 kg/m  Physical Exam Vitals and nursing note reviewed.   Gen: Coughing intermittently throughout interview Eyes: PERRL, EOMI HEENT: no oropharyngeal swelling Neck: trachea midline Resp: Diminished with diffuse wheezes throughout all lung fields Card: RRR, no murmurs, rubs, or gallops Abd: nontender, nondistended Extremities: no calf tenderness, no edema Vascular: 2+ radial pulses bilaterally, 2+ DP pulses bilaterally Skin: no rashes Psyc: acting appropriately   ED Results / Procedures / Treatments   Labs (all labs ordered are listed, but only abnormal results are displayed)  Labs Reviewed  BASIC METABOLIC PANEL - Abnormal; Notable for the following components:      Result Value   Potassium 3.2 (*)    Glucose, Bld 106 (*)    All other components within normal limits  CBC - Abnormal; Notable for the following components:   WBC 2.7 (*)    All other components within normal limits  RESP PANEL BY RT-PCR (RSV, FLU A&B, COVID)  RVPGX2  HCG, SERUM, QUALITATIVE  TROPONIN I (HIGH SENSITIVITY)  TROPONIN I (HIGH SENSITIVITY)    EKG EKG  Interpretation Date/Time:  Monday February 14 2023 13:26:59 EDT Ventricular Rate:  80 PR Interval:  137 QRS Duration:  77 QT Interval:  377 QTC Calculation: 435 R Axis:   88  Text Interpretation: Sinus rhythm Confirmed by Beckey Downing [phone]) on 02/14/2023 4:51:01 PM  Radiology DG Chest 2 View  Result Date: 02/14/2023 CLINICAL DATA:  Palpitations. EXAM: CHEST - 2 VIEW COMPARISON:  01/03/2020 FINDINGS: The heart size and mediastinal contours are within normal limits. Both lungs are clear. The visualized skeletal structures are unremarkable. IMPRESSION: Normal exam. Electronically Signed   By: Danae Orleans M.D.   On: 02/14/2023 14:59    Procedures Procedures  {Document cardiac monitor, telemetry assessment procedure when appropriate:1}  Medications Ordered in ED Medications  predniSONE (DELTASONE) tablet 60 mg (has no administration in time range)  ipratropium-albuterol (DUONEB) 0.5-2.5 (3) MG/3ML nebulizer solution 3 mL (has no administration in time range)  ipratropium-albuterol (DUONEB) 0.5-2.5 (3) MG/3ML nebulizer solution 3 mL (has no administration in time range)  ipratropium-albuterol (DUONEB) 0.5-2.5 (3) MG/3ML nebulizer solution 3 mL (has no administration in time range)    ED Course/ Medical Decision Making/ A&P   {   Click here for ABCD2, HEART and other calculatorsREFRESH Note before signing :1}                              Medical Decision Making 37 year old female with past medical history of asthma and tobacco abuse presenting to the emergency department today with chest discomfort and cough found to be wheezing here on exam.  I will further evaluate the patient here with basic labs follows an EKG, chest x-ray, and troponin for further evaluation for myocarditis, pericarditis, pulmonary edema, pulmonary infiltrates, or pneumothorax.  I will give the patient prednisone here as well as back-to-back DuoNeb's given her wheezing.  She is PERC negative and based on description  of her symptoms suspicion for pulmonary embolism is low at this time.  Also doubt this is due to aortic pathology.  Also obtain a COVID swab on the patient.  The patient's EKG interpreted by me shows a sinus rhythm with a rate of 80 with normal axis, normal intervals, no significant ST-T changes.  Amount and/or Complexity of Data Reviewed Labs: ordered. Radiology: ordered.  Risk Prescription drug management.   ***  {Document critical care time when appropriate:1} {Document review of labs and clinical decision tools ie heart score, Chads2Vasc2 etc:1}  {Document your independent review of radiology images, and any outside records:1} {Document your discussion with family members, caretakers, and with consultants:1} {Document social determinants of health affecting pt's care:1} {Document your decision making why or why not admission, treatments were needed:1} Final Clinical Impression(s) / ED Diagnoses Final diagnoses:  None    Rx / DC Orders ED Discharge Orders     None

## 2023-02-21 DIAGNOSIS — I5189 Other ill-defined heart diseases: Secondary | ICD-10-CM | POA: Diagnosis not present

## 2023-02-28 DIAGNOSIS — R002 Palpitations: Secondary | ICD-10-CM | POA: Diagnosis not present

## 2023-02-28 DIAGNOSIS — I5189 Other ill-defined heart diseases: Secondary | ICD-10-CM | POA: Diagnosis not present

## 2023-02-28 DIAGNOSIS — R55 Syncope and collapse: Secondary | ICD-10-CM | POA: Diagnosis not present

## 2023-03-01 DIAGNOSIS — Z1231 Encounter for screening mammogram for malignant neoplasm of breast: Secondary | ICD-10-CM | POA: Diagnosis not present

## 2023-03-01 DIAGNOSIS — R92333 Mammographic heterogeneous density, bilateral breasts: Secondary | ICD-10-CM | POA: Diagnosis not present

## 2023-03-09 DIAGNOSIS — R079 Chest pain, unspecified: Secondary | ICD-10-CM | POA: Diagnosis not present

## 2023-03-09 DIAGNOSIS — G43119 Migraine with aura, intractable, without status migrainosus: Secondary | ICD-10-CM | POA: Diagnosis not present

## 2023-03-09 DIAGNOSIS — F411 Generalized anxiety disorder: Secondary | ICD-10-CM | POA: Diagnosis not present

## 2023-03-09 DIAGNOSIS — R002 Palpitations: Secondary | ICD-10-CM | POA: Diagnosis not present

## 2023-03-14 DIAGNOSIS — R457 State of emotional shock and stress, unspecified: Secondary | ICD-10-CM | POA: Diagnosis not present

## 2023-03-14 DIAGNOSIS — R079 Chest pain, unspecified: Secondary | ICD-10-CM | POA: Diagnosis not present

## 2023-03-16 ENCOUNTER — Other Ambulatory Visit: Payer: Self-pay

## 2023-03-16 ENCOUNTER — Encounter (HOSPITAL_BASED_OUTPATIENT_CLINIC_OR_DEPARTMENT_OTHER): Payer: Self-pay

## 2023-03-16 ENCOUNTER — Emergency Department (HOSPITAL_BASED_OUTPATIENT_CLINIC_OR_DEPARTMENT_OTHER)
Admission: EM | Admit: 2023-03-16 | Discharge: 2023-03-17 | Disposition: A | Discharge status: Home / Self Care | Payer: Medicaid Other | Attending: Emergency Medicine | Admitting: Emergency Medicine

## 2023-03-16 ENCOUNTER — Emergency Department (HOSPITAL_BASED_OUTPATIENT_CLINIC_OR_DEPARTMENT_OTHER): Payer: Medicaid Other

## 2023-03-16 DIAGNOSIS — R072 Precordial pain: Secondary | ICD-10-CM | POA: Insufficient documentation

## 2023-03-16 DIAGNOSIS — R519 Headache, unspecified: Secondary | ICD-10-CM | POA: Diagnosis not present

## 2023-03-16 DIAGNOSIS — R0789 Other chest pain: Secondary | ICD-10-CM | POA: Diagnosis not present

## 2023-03-16 LAB — BASIC METABOLIC PANEL
Anion gap: 10 (ref 5–15)
BUN: 14 mg/dL (ref 6–20)
CO2: 26 mmol/L (ref 22–32)
Calcium: 9 mg/dL (ref 8.9–10.3)
Chloride: 100 mmol/L (ref 98–111)
Creatinine, Ser: 0.85 mg/dL (ref 0.44–1.00)
GFR, Estimated: >60 mL/min (ref >60)
Glucose, Bld: 98 mg/dL (ref 70–99)
Potassium: 3.4 mmol/L — ABNORMAL LOW (ref 3.5–5.1)
Sodium: 136 mmol/L (ref 135–145)

## 2023-03-16 LAB — CBC
HCT: 34.7 % — ABNORMAL LOW (ref 36.0–46.0)
Hemoglobin: 11.2 g/dL — ABNORMAL LOW (ref 12.0–15.0)
MCH: 31.2 pg (ref 26.0–34.0)
MCHC: 32.3 g/dL (ref 30.0–36.0)
MCV: 96.7 fL (ref 80.0–100.0)
Platelets: 194 10*3/uL (ref 150–400)
RBC: 3.59 MIL/uL — ABNORMAL LOW (ref 3.87–5.11)
RDW: 11.9 % (ref 11.5–15.5)
WBC: 3.4 10*3/uL — ABNORMAL LOW (ref 4.0–10.5)
nRBC: 0 % (ref 0.0–0.2)

## 2023-03-16 LAB — PREGNANCY, URINE: Preg Test, Ur: NEGATIVE

## 2023-03-16 LAB — TROPONIN I (HIGH SENSITIVITY): Troponin I (High Sensitivity): 2 ng/L (ref <18)

## 2023-03-16 MED ORDER — IBUPROFEN 800 MG PO TABS
800.0000 mg | ORAL_TABLET | Freq: Once | ORAL | Status: DC
  Filled 2023-03-16: qty 1

## 2023-03-16 MED ORDER — ACETAMINOPHEN 500 MG PO TABS
1000.0000 mg | ORAL_TABLET | Freq: Once | ORAL | Status: AC
  Administered 2023-03-16: 1000 mg via ORAL
  Filled 2023-03-16: qty 2

## 2023-03-16 NOTE — ED Provider Notes (Signed)
[place] EMERGENCY DEPARTMENT AT [facility] Provider Note   CSN: [id number] Arrival date & time: 03/16/23  2229     History {Add pertinent medical, surgical, social history, OB history to HPI:1} Chief Complaint  Patient presents with   Chest Pain    Courtney Adams is a 37 y.o. female.   Chest Pain      Home Medications Prior to Admission medications   Medication Sig Start Date End Date Taking? Authorizing Provider  escitalopram (LEXAPRO) 10 MG tablet Take 1 tablet (10 mg total) by mouth daily. 01/14/21   McElwee, Lauren A, NP  metroNIDAZOLE (METROGEL) 0.75 % vaginal gel Place vaginally 2 (two) times a week. 10/28/20   [provider]  metroNIDAZOLE (METROGEL) 0.75 % vaginal gel Place 1 Applicatorful vaginally at bedtime. 01/15/21   McElwee, Lauren A, NP  fluticasone (FLONASE) 50 MCG/ACT nasal spray Place 2 sprays into both nostrils daily. Patient not taking: Reported on 05/25/2017 04/14/17 01/01/20  Almon Hercules, MD  omeprazole (PRILOSEC) 20 MG capsule Take 1 capsule (20 mg total) by mouth daily. Patient not taking: Reported on 01/19/2018 06/09/17 01/01/20  Freddrick March, MD      Allergies    Patient has no known allergies.    Review of Systems   Review of Systems  Cardiovascular:  Positive for chest pain.    Physical Exam Updated Vital Signs BP 105/81 (BP Location: Left Arm)   Pulse 61   Temp 98.6 F (37 C) (Oral)   Resp 18   Ht 5\' 7"  (1.702 m)   Wt 50.8 kg   LMP 03/09/2023 (Exact Date)   SpO2 100%   BMI 17.54 kg/m  Physical Exam  ED Results / Procedures / Treatments   Labs (all labs ordered are listed, but only abnormal results are displayed) Labs Reviewed  BASIC METABOLIC PANEL - Abnormal; Notable for the following components:      Result Value   Potassium 3.4 (*)    All other components within normal limits  CBC - Abnormal; Notable for the following components:   WBC 3.4 (*)    RBC 3.59 (*)    Hemoglobin 11.2 (*)    HCT 34.7 (*)     All other components within normal limits  PREGNANCY, URINE  TROPONIN I (HIGH SENSITIVITY)    EKG EKG Interpretation Date/Time:  Wednesday March 16 2023 22:49:27 EDT Ventricular Rate:  69 PR Interval:  140 QRS Duration:  68 QT Interval:  379 QTC Calculation: 406 R Axis:   81  Text Interpretation: Sinus rhythm Confirmed by Elanore Talcott ([id number]) on 03/16/2023 11:20:26 PM  Radiology DG Chest 2 View  Result Date: 03/16/2023 CLINICAL DATA:  Central chest pain. EXAM: CHEST - 2 VIEW COMPARISON:  PA and lateral 02/14/2023 FINDINGS: The heart size and mediastinal contours are within normal limits. Both lungs are clear. The visualized skeletal structures are unremarkable. IMPRESSION: No active cardiopulmonary disease.  Stable chest. Electronically Signed   By: Almira Bar M.D.   On: 03/16/2023 23:17    Procedures Procedures  {Document cardiac monitor, telemetry assessment procedure when appropriate:1}  Medications Ordered in ED Medications  ibuprofen (ADVIL) tablet 800 mg (800 mg Oral Patient Refused/Not Given 03/16/23 2333)  acetaminophen (TYLENOL) tablet 1,000 mg (1,000 mg Oral Given 03/16/23 2333)    ED Course/ Medical Decision Making/ A&P   {   Click here for ABCD2, HEART and other calculatorsREFRESH Note before signing :1}  Medical Decision Making Amount and/or Complexity of Data Reviewed Labs: ordered. Radiology: ordered.  Risk OTC drugs. Prescription drug management.    Final Clinical Impression(s) / ED Diagnoses Final diagnoses:  None   Return for intractable cough, coughing up blood, fevers > 100.4 unrelieved by medication, shortness of breath, intractable vomiting, chest pain, shortness of breath, weakness, numbness, changes in speech, facial asymmetry, abdominal pain, passing out, Inability to tolerate liquids or food, cough, altered mental status or any concerns. No signs of systemic illness or infection. The patient is  nontoxic-appearing on exam and vital signs are within normal limits.  I have reviewed the triage vital signs and the nursing notes. Pertinent labs & imaging results that were available during my care of the patient were reviewed by me and considered in my medical decision making (see chart for details). After history, exam, and medical workup I feel the patient has been appropriately medically screened and is safe for discharge home. Pertinent diagnoses were discussed with the patient. Patient was given return precautions.

## 2023-03-16 NOTE — ED Triage Notes (Signed)
Pt states she has had CP last 3 days Center of chest, denies radiation, SHOB or diaphoresis. Does report being lightheaded at times

## 2023-03-16 NOTE — ED Notes (Signed)
Patient transported to X-ray 

## 2023-03-17 NOTE — ED Provider Notes (Signed)
Patient is visibly anxious on exam and symptoms are consistent with with.  Patient has been told this is anxiety multiple times but does not want to accept this.     Courtney Ulbrich, MD 03/17/23 [phone]

## 2023-03-21 DIAGNOSIS — F411 Generalized anxiety disorder: Secondary | ICD-10-CM | POA: Diagnosis not present

## 2023-03-21 DIAGNOSIS — I493 Ventricular premature depolarization: Secondary | ICD-10-CM | POA: Diagnosis not present

## 2023-03-21 DIAGNOSIS — R002 Palpitations: Secondary | ICD-10-CM | POA: Diagnosis not present

## 2023-03-23 DIAGNOSIS — R002 Palpitations: Secondary | ICD-10-CM | POA: Diagnosis not present

## 2023-03-23 DIAGNOSIS — F411 Generalized anxiety disorder: Secondary | ICD-10-CM | POA: Diagnosis not present

## 2023-03-23 DIAGNOSIS — I493 Ventricular premature depolarization: Secondary | ICD-10-CM | POA: Diagnosis not present

## 2023-04-25 DIAGNOSIS — J3089 Other allergic rhinitis: Secondary | ICD-10-CM | POA: Diagnosis not present

## 2023-05-18 DIAGNOSIS — F411 Generalized anxiety disorder: Secondary | ICD-10-CM | POA: Diagnosis not present

## 2023-05-18 DIAGNOSIS — G43109 Migraine with aura, not intractable, without status migrainosus: Secondary | ICD-10-CM | POA: Diagnosis not present

## 2023-05-18 DIAGNOSIS — Z8619 Personal history of other infectious and parasitic diseases: Secondary | ICD-10-CM | POA: Diagnosis not present

## 2023-05-18 DIAGNOSIS — R5383 Other fatigue: Secondary | ICD-10-CM | POA: Diagnosis not present

## 2023-05-18 DIAGNOSIS — Z113 Encounter for screening for infections with a predominantly sexual mode of transmission: Secondary | ICD-10-CM | POA: Diagnosis not present

## 2023-05-18 DIAGNOSIS — K130 Diseases of lips: Secondary | ICD-10-CM | POA: Diagnosis not present

## 2023-06-02 DIAGNOSIS — G43119 Migraine with aura, intractable, without status migrainosus: Secondary | ICD-10-CM | POA: Diagnosis not present

## 2023-06-02 DIAGNOSIS — R002 Palpitations: Secondary | ICD-10-CM | POA: Diagnosis not present

## 2023-06-02 DIAGNOSIS — E611 Iron deficiency: Secondary | ICD-10-CM | POA: Diagnosis not present

## 2023-06-02 DIAGNOSIS — Z13220 Encounter for screening for lipoid disorders: Secondary | ICD-10-CM | POA: Diagnosis not present

## 2023-06-02 DIAGNOSIS — Z0000 Encounter for general adult medical examination without abnormal findings: Secondary | ICD-10-CM | POA: Diagnosis not present

## 2023-06-02 DIAGNOSIS — I959 Hypotension, unspecified: Secondary | ICD-10-CM | POA: Diagnosis not present

## 2023-06-06 DIAGNOSIS — I493 Ventricular premature depolarization: Secondary | ICD-10-CM | POA: Diagnosis not present

## 2023-06-06 DIAGNOSIS — F419 Anxiety disorder, unspecified: Secondary | ICD-10-CM | POA: Diagnosis not present

## 2023-06-06 DIAGNOSIS — R002 Palpitations: Secondary | ICD-10-CM | POA: Diagnosis not present

## 2023-06-16 DIAGNOSIS — R0600 Dyspnea, unspecified: Secondary | ICD-10-CM | POA: Diagnosis not present

## 2023-06-16 DIAGNOSIS — G43119 Migraine with aura, intractable, without status migrainosus: Secondary | ICD-10-CM | POA: Diagnosis not present

## 2023-06-27 DIAGNOSIS — G43119 Migraine with aura, intractable, without status migrainosus: Secondary | ICD-10-CM | POA: Diagnosis not present

## 2023-06-27 DIAGNOSIS — R0600 Dyspnea, unspecified: Secondary | ICD-10-CM | POA: Diagnosis not present

## 2023-06-27 DIAGNOSIS — F410 Panic disorder [episodic paroxysmal anxiety] without agoraphobia: Secondary | ICD-10-CM | POA: Diagnosis not present

## 2023-06-27 DIAGNOSIS — E611 Iron deficiency: Secondary | ICD-10-CM | POA: Diagnosis not present

## 2023-06-27 DIAGNOSIS — F411 Generalized anxiety disorder: Secondary | ICD-10-CM | POA: Diagnosis not present

## 2023-07-18 DIAGNOSIS — R9089 Other abnormal findings on diagnostic imaging of central nervous system: Secondary | ICD-10-CM | POA: Diagnosis not present

## 2023-07-18 DIAGNOSIS — R0609 Other forms of dyspnea: Secondary | ICD-10-CM | POA: Diagnosis not present

## 2023-07-18 DIAGNOSIS — G43119 Migraine with aura, intractable, without status migrainosus: Secondary | ICD-10-CM | POA: Diagnosis not present

## 2023-08-04 DIAGNOSIS — R0609 Other forms of dyspnea: Secondary | ICD-10-CM | POA: Diagnosis not present

## 2023-08-04 DIAGNOSIS — Z2839 Other underimmunization status: Secondary | ICD-10-CM | POA: Diagnosis not present

## 2023-08-09 DIAGNOSIS — R0609 Other forms of dyspnea: Secondary | ICD-10-CM | POA: Diagnosis not present

## 2023-08-23 DIAGNOSIS — Z2820 Immunization not carried out because of patient decision for unspecified reason: Secondary | ICD-10-CM | POA: Diagnosis not present

## 2023-08-23 DIAGNOSIS — F411 Generalized anxiety disorder: Secondary | ICD-10-CM | POA: Diagnosis not present

## 2023-08-23 DIAGNOSIS — L710 Perioral dermatitis: Secondary | ICD-10-CM | POA: Diagnosis not present

## 2023-08-23 DIAGNOSIS — E611 Iron deficiency: Secondary | ICD-10-CM | POA: Diagnosis not present

## 2023-09-11 NOTE — Progress Notes (Unsigned)
 New Patient Note  RE: Courtney Adams MRN: [id number] DOB: 1985-08-09 Date of Office Visit: 09/12/2023  Consult requested by: No ref. provider found Primary care provider: System, Provider Not In  Chief Complaint: No chief complaint on file.  History of Present Illness: I had the pleasure of seeing Courtney Adams for initial evaluation at the [facility] on 09/11/2023. She is a 38 y.o. female, who is referred here by System, Provider Not In for the evaluation of ***.  Discussed the use of AI scribe software for clinical note transcription with the patient, who gave verbal consent to proceed.  History of Present Illness             ***  Assessment and Plan: Courtney Adams is a 37 y.o. female with: ***  Assessment and Plan               No follow-ups on file.  No orders of the defined types were placed in this encounter.  Lab Orders  No laboratory test(s) ordered today    Other allergy screening: Asthma: {Blank single:19197::"yes","no"} Rhino conjunctivitis: {Blank single:19197::"yes","no"} Food allergy: {Blank single:19197::"yes","no"} Medication allergy: {Blank single:19197::"yes","no"} Hymenoptera allergy: {Blank single:19197::"yes","no"} Urticaria: {Blank single:19197::"yes","no"} Eczema:{Blank single:19197::"yes","no"} History of recurrent infections suggestive of immunodeficency: {Blank single:19197::"yes","no"}  Diagnostics: Spirometry:  Tracings reviewed. Her effort: {Blank single:19197::"Good reproducible efforts.","It was hard to get consistent efforts and there is a question as to whether this reflects a maximal maneuver.","Poor effort, data can not be interpreted."} FVC: ***L FEV1: ***L, ***% predicted FEV1/FVC ratio: ***% Interpretation: {Blank single:19197::"Spirometry consistent with mild obstructive disease","Spirometry consistent with moderate obstructive disease","Spirometry consistent with severe obstructive disease","Spirometry  consistent with possible restrictive disease","Spirometry consistent with mixed obstructive and restrictive disease","Spirometry uninterpretable due to technique","Spirometry consistent with normal pattern","No overt abnormalities noted given today's efforts"}.  Please see scanned spirometry results for details.  Skin Testing: {Blank single:19197::"Select foods","Environmental allergy panel","Environmental allergy panel and select foods","Food allergy panel","None","Deferred due to recent antihistamines use"}. *** Results discussed with patient/family.   Past Medical History: Patient Active Problem List   Diagnosis Date Noted  . Supervision of other normal pregnancy, antepartum 01/19/2018  . Chest pain 06/09/2017  . GAD (generalized anxiety disorder) 12/22/2016  . Tobacco use disorder 11/20/2015  . Occipital lymphadenopathy 01/22/2014  . Hypotension, unspecified 01/22/2014  . High risk sexual behavior 10/19/2013  . Bacterial vaginosis 06/06/2013  . Mood disorder (HCC) 06/19/2012  . Marijuana abuse 06/19/2012  . Encounter for preconception consultation 06/19/2012   Past Medical History:  Diagnosis Date  . Abortion   . Anemia   . Anxiety   . Anxiety   . Bipolar 1 disorder (HCC)   . BV (bacterial vaginosis)   . Chlamydia   . Gonorrhea   . Headache   . Infection    UTI  . Pneumonia   . Preterm delivery   . Preterm labor   . Trichomonas   . Vaginal Pap smear, abnormal    Past Surgical History: Past Surgical History:  Procedure Laterality Date  . DILATION AND CURETTAGE OF UTERUS     Medication List:  Current Outpatient Medications  Medication Sig Dispense Refill  . escitalopram (LEXAPRO) 10 MG tablet Take 1 tablet (10 mg total) by mouth daily. 30 tablet 0  . metroNIDAZOLE (METROGEL) 0.75 % vaginal gel Place vaginally 2 (two) times a week.    . metroNIDAZOLE (METROGEL) 0.75 % vaginal gel Place 1 Applicatorful vaginally at bedtime. 70 g 0   No current  facility-administered medications  for this visit.   Allergies: No Known Allergies Social History: Social History   Socioeconomic History  . Marital status: Single    Spouse name: Not on file  . Number of children: 2  . Years of education: Not on file  . Highest education level: Associate degree: academic program  Occupational History  . Occupation: unemployed  Tobacco Use  . Smoking status: Former    Current packs/day: 0.00    Types: Cigarettes    Quit date: 02/20/2017    Years since quitting: 6.5  . Smokeless tobacco: Never  Vaping Use  . Vaping status: Never Used  Substance and Sexual Activity  . Alcohol use: No    Alcohol/week: 0.0 standard drinks of alcohol  . Drug use: Not Currently    Frequency: 5.0 times per week    Types: Marijuana  . Sexual activity: Yes    Partners: Male    Birth control/protection: None  Other Topics Concern  . Not on file  Social History Narrative   Patient is well adjusted.   Social Drivers of Corporate investment banker Strain: Low Risk  (08/23/2023)   Received from [facility]   Overall Financial Resource Strain (CARDIA)   . Difficulty of Paying Living Expenses: Not hard at all  Food Insecurity: No Food Insecurity (08/23/2023)   [facility] from [facility]   Hunger Vital Sign   . Worried About Programme researcher, broadcasting/film/video in the Last Year: Never true   . Ran Out of Food in the Last Year: Never true  Transportation Needs: No Transportation Needs (08/23/2023)   Received from [facility] - Transportation   . Lack of Transportation (Medical): No   . Lack of Transportation (Non-Medical): No  Physical Activity: Unknown (06/28/2022)   Received from [facility]   Exercise Vital Sign   . Days of Exercise per Week: Patient declined   . Minutes of Exercise per Session: Not on file  Stress: Patient Declined (06/28/2022)   Received from [facility] - Occupational Stress Questionnaire   .  Feeling of Stress : Patient declined  Social Connections: Unknown (10/11/2021)   [facility]   Social Network   . Social Network: Not on file   Lives in a ***. Smoking: *** Occupation: ***  Environmental HistorySurveyor, minerals in the house: Copywriter, advertising in the family room: {Blank single:19197::"yes","no"} Carpet in the bedroom: {Blank single:19197::"yes","no"} Heating: {Blank single:19197::"electric","gas","heat pump"} Cooling: {Blank single:19197::"central","window","heat pump"} Pet: {Blank single:19197::"yes ***","no"}  Family History: Family History  Problem Relation Age of Onset  . Anemia Mother   . Varicose Veins Mother   . Cervical cancer Sister   . Cancer Sister   . Diabetes Maternal Grandmother   . Heart disease Paternal Grandmother   . Heart disease Paternal Grandfather   . Stroke Paternal Grandfather   . Stroke Paternal Uncle    Problem                               Relation Asthma                                   *** Eczema                                ***  Food allergy                          *** Allergic rhino conjunctivitis     ***  Review of Systems  Constitutional:  Negative for appetite change, chills, fever and unexpected weight change.  HENT:  Negative for congestion and rhinorrhea.   Eyes:  Negative for itching.  Respiratory:  Negative for cough, chest tightness, shortness of breath and wheezing.   Cardiovascular:  Negative for chest pain.  Gastrointestinal:  Negative for abdominal pain.  Genitourinary:  Negative for difficulty urinating.  Skin:  Negative for rash.  Neurological:  Negative for headaches.   Objective: There were no vitals taken for this visit. There is no height or weight on file to calculate BMI. Physical Exam Vitals and nursing note reviewed.  Constitutional:      Appearance: Normal appearance. She is well-developed.  HENT:     Head: Normocephalic and  atraumatic.     Right Ear: Tympanic membrane and external ear normal.     Left Ear: Tympanic membrane and external ear normal.     Nose: Nose normal.     Mouth/Throat:     Mouth: Mucous membranes are moist.     Pharynx: Oropharynx is clear.  Eyes:     Conjunctiva/sclera: Conjunctivae normal.  Cardiovascular:     Rate and Rhythm: Normal rate and regular rhythm.     Heart sounds: Normal heart sounds. No murmur heard.    No friction rub. No gallop.  Pulmonary:     Effort: Pulmonary effort is normal.     Breath sounds: Normal breath sounds. No wheezing, rhonchi or rales.  Musculoskeletal:     Cervical back: Neck supple.  Skin:    General: Skin is warm.     Findings: No rash.  Neurological:     Mental Status: She is alert and oriented to person, place, and time.  Psychiatric:        Behavior: Behavior normal.  The plan was reviewed with the patient/family, and all questions/concerned were addressed.  It was my pleasure to see Courtney Adams today and participate in her care. Please feel free to contact me with any questions or concerns.  Sincerely,  Wyline Mood, DO Allergy & Immunology  [facility] office: [phone] [facility] office: [phone]

## 2023-09-12 ENCOUNTER — Encounter: Payer: Self-pay | Admitting: Allergy

## 2023-09-12 ENCOUNTER — Ambulatory Visit (INDEPENDENT_AMBULATORY_CARE_PROVIDER_SITE_OTHER): Admitting: Allergy

## 2023-09-12 ENCOUNTER — Other Ambulatory Visit: Payer: Self-pay

## 2023-09-12 ENCOUNTER — Telehealth: Payer: Self-pay

## 2023-09-12 VITALS — BP 99/70 | HR 73 | Temp 97.2°F | Resp 16 | Ht 67.0 in | Wt 114.8 lb

## 2023-09-12 DIAGNOSIS — R031 Nonspecific low blood-pressure reading: Secondary | ICD-10-CM | POA: Diagnosis not present

## 2023-09-12 DIAGNOSIS — L2389 Allergic contact dermatitis due to other agents: Secondary | ICD-10-CM | POA: Diagnosis not present

## 2023-09-12 DIAGNOSIS — J3089 Other allergic rhinitis: Secondary | ICD-10-CM | POA: Diagnosis not present

## 2023-09-12 MED ORDER — PIMECROLIMUS 1 % EX CREA: TOPICAL_CREAM | Freq: Two times a day (BID) | CUTANEOUS | 2 refills | Status: DC | PRN

## 2023-09-12 MED ORDER — TACROLIMUS 0.1 % EX OINT: TOPICAL_OINTMENT | Freq: Two times a day (BID) | CUTANEOUS | 1 refills | Status: DC | PRN

## 2023-09-12 NOTE — Patient Instructions (Addendum)
 Skin Concerning for contact dermatitis.  Keep track of rashes and take pictures. Write down what you had done/eaten during flares.  See below for proper skin care. Use fragrance free and dye free products. No dryer sheets or fabric softener.   Avoid spicy foods, acidic foods and crunchy foods as they irritate the skin when you have a flare.  Use Elidel (pimecrolimus) 0.1% cream twice a day as needed for rash flares.  Do not use more than 2 weeks in a row.   If no improvement or worsening recommend patch testing next. Patches are best placed on Monday with return to office on Wednesday and Friday of same week for readings.  Patches once placed should not get wet.  You do not have to stop any medications for patch testing but should not be on oral prednisone. You can schedule a patch testing visit when convenient for your schedule.    Environmental allergies Monitor symptoms.  Use over the counter antihistamines such as Zyrtec (cetirizine), Claritin (loratadine), Allegra (fexofenadine), or Xyzal (levocetirizine) daily as needed. May take twice a day during allergy flares. May switch antihistamines every few months. Consider allergy testing in the future.  Low blood pressure Follow up with your PCP regarding this as you are also having dizziness and lightheadedness at times.  Follow up in 2 months or sooner if needed.  Skin care recommendations  Bath time: Always use lukewarm water. AVOID very hot or cold water. Keep bathing time to 5-10 minutes. Do NOT use bubble bath. Use a mild soap and use just enough to wash the dirty areas. Do NOT scrub skin vigorously.  After bathing, pat dry your skin with a towel. Do NOT rub or scrub the skin.  Moisturizers and prescriptions:  ALWAYS apply moisturizers immediately after bathing (within 3 minutes). This helps to lock-in moisture. Use the moisturizer several times a day over the whole body. Good summer moisturizers include: Aveeno, CeraVe,  Cetaphil. Good winter moisturizers include: Aquaphor, Vaseline, Cerave, Cetaphil, Eucerin, Vanicream. When using moisturizers along with medications, the moisturizer should be applied about one hour after applying the medication to prevent diluting effect of the medication or moisturize around where you applied the medications. When not using medications, the moisturizer can be continued twice daily as maintenance.  Laundry and clothing: Avoid laundry products with added color or perfumes. Use unscented hypo-allergenic laundry products such as Tide free, Cheer free & gentle, and All free and clear.  If the skin still seems dry or sensitive, you can try double-rinsing the clothes. Avoid tight or scratchy clothing such as wool. Do not use fabric softeners or dyer sheets.

## 2023-09-12 NOTE — Telephone Encounter (Signed)
*  Asthma/Allergy  Pharmacy Patient Advocate Encounter   Received notification from CoverMyMeds that prior authorization for Pimecrolimus 1% cream  is required/requested.   Insurance verification completed.   The patient is insured through [facility] .   Per test claim: PA required; PA started via CoverMyMeds. KEY [id number] . Please see clinical question(s) below that I am not finding the answer to in her chart and advise.   Has the patient had a trial and failure of TWO of the following: brand Elidel cream, Eucrisa 2% ointment, Protopic ointment, tacrolimus ointment (generic for Protopic)?*

## 2023-09-12 NOTE — Telephone Encounter (Signed)
 Use protopic 0.1% ointment twice a day as needed for rash flares.

## 2023-09-14 ENCOUNTER — Telehealth: Payer: Self-pay

## 2023-09-14 ENCOUNTER — Ambulatory Visit: Admitting: Internal Medicine

## 2023-09-14 NOTE — Telephone Encounter (Signed)
*  Asthma/Allergy  Pharmacy Patient Advocate Encounter   Received notification from CoverMyMeds that prior authorization for Tacrolimus 0.1% ointment  is required/requested.   Insurance verification completed.   The patient is insured through [facility] .   Per test claim: PA required; PA submitted to above mentioned insurance via CoverMyMeds Key/confirmation #/EOC [id number] Status is pending

## 2023-09-14 NOTE — Telephone Encounter (Signed)
 Approved today by [employer] [place] [employer] [place] Case: [id number], Status: Approved, Coverage Starts on: 09/14/2023 12:00:00 AM, Coverage Ends on: 09/13/2024 12:00:00 AM.

## 2023-09-15 ENCOUNTER — Other Ambulatory Visit: Payer: Self-pay | Admitting: *Deleted

## 2023-09-15 ENCOUNTER — Telehealth: Payer: Self-pay | Admitting: Allergy

## 2023-09-15 MED ORDER — TACROLIMUS 0.1 % EX OINT: TOPICAL_OINTMENT | Freq: Two times a day (BID) | CUTANEOUS | 1 refills | Status: DC | PRN

## 2023-09-15 NOTE — Telephone Encounter (Signed)
 Resent prescription. Will call pharmacy shortly and confirm coverage and PA approval.

## 2023-09-15 NOTE — Telephone Encounter (Signed)
 Patient called and stated that the [facility] on [facility] did not receive the Tacrolimus Ointment that was sent in and approved by her insurance. She stated that they told her it would be 80 dollars and patient stated her insurance approve of the medication and Stated Dr.Kim said it would 4 dollars and not 80.   Best contact: [phone]

## 2023-09-15 NOTE — Telephone Encounter (Signed)
 Called and spoke with the pharmacy. They stated that there is a prescription ready for pickup for Tacrolimus for $4. Called patient and informed, patient verbalized understanding.

## 2023-09-18 NOTE — Progress Notes (Unsigned)
 Follow Up Note  RE: Courtney Adams MRN: [id number] DOB: Aug 15, 1985 Date of Office Visit: 09/19/2023  Referring provider: Porfirio Oar, PA Primary care provider: Porfirio Oar, PA  Chief Complaint: No chief complaint on file.  History of Present Illness: I had the pleasure of seeing Courtney Adams for a follow up visit at the [facility] on 09/18/2023. She is a 38 y.o. female, who is being followed for contact dermatitis, allergic rhinitis. Her previous allergy office visit was on 09/12/2023 with Dr. Selena Adams. Today is a new complaint visit of ***.  Discussed the use of AI scribe software for clinical note transcription with the patient, who gave verbal consent to proceed.  History of Present Illness            Use protopic 0.1% ointment twice a day as needed for rash flares.   Called and spoke with the pharmacy. They stated that there is a prescription ready for pickup for Tacrolimus for $4.      Assessment and Plan: Courtney Adams is a 37 y.o. female with: Allergic contact dermatitis due to other agents Intermittent itching, and peeling of the lips and neck for over a month, consistent with contact dermatitis likely due to irritants in personal care products. Mild lip swelling. Triamcinolone cream provides temporary relief but is not recommended around the mouth due to potency. Symptoms exacerbated by products like Versace body wash, which she has discontinued. Keep track of rashes and take pictures. Write down what you had done/eaten during flares.  See below for proper skin care. Use fragrance free and dye free products. No dryer sheets or fabric softener.   Avoid spicy foods, acidic foods and crunchy foods as they irritate the skin when you have a flare.  Use Elidel (pimecrolimus) 0.1% cream twice a day as needed for rash flares.  Do not use more than 2 weeks in a row.  If no improvement or worsening recommend patch testing next. Patches are best placed on Monday  with return to office on Wednesday and Friday of same week for readings.  Patches once placed should not get wet.  You do not have to stop any medications for patch testing but should not be on oral prednisone. You can schedule a patch testing visit when convenient for your schedule.     Other allergic rhinitis  Monitor symptoms.  Use over the counter antihistamines such as Zyrtec (cetirizine), Claritin (loratadine), Allegra (fexofenadine), or Xyzal (levocetirizine) daily as needed. May take twice a day during allergy flares. May switch antihistamines every few months. Consider allergy testing in the future.   Low blood pressure reading  Blood pressure at 99/70 mmHg, on the lower side. Experiences dizziness at times. Cardiac work up in the past unremarkable per patient. Follow up with your PCP regarding this as you are also having dizziness and lightheadedness at times. Assessment and Plan              No follow-ups on file.  No orders of the defined types were placed in this encounter.  Lab Orders  No laboratory test(s) ordered today    Diagnostics: Spirometry:  Courtney reviewed. [name]: {Blank single:19197::"Good reproducible efforts.","It was hard to get consistent efforts and there is a question as to whether this reflects a maximal maneuver.","Poor Adams, Courtney can not be interpreted."} FVC: ***L FEV1: ***L, ***% predicted FEV1/FVC ratio: ***% Interpretation: {Blank single:19197::"Spirometry consistent with mild obstructive disease","Spirometry consistent with moderate obstructive disease","Spirometry consistent with severe obstructive disease","Spirometry  consistent with possible restrictive disease","Spirometry consistent with mixed obstructive and restrictive disease","Spirometry uninterpretable due to technique","Spirometry consistent with normal pattern","No overt abnormalities noted given today's efforts"}.  Please see scanned spirometry results for details.  Skin  Testing: {Blank single:19197::"Select foods","Environmental allergy panel","Environmental allergy panel and select foods","Food allergy panel","None","Deferred due to recent antihistamines use"}. *** Results discussed with patient/family.   Medication List:  Current Outpatient Medications  Medication Sig Dispense Refill   Ferrous Sulfate Dried (SLOW RELEASE IRON) 45 MG TBCR Take by mouth.     tacrolimus (PROTOPIC) 0.1 % ointment Apply topically 2 (two) times daily as needed. 60 g 1   No current facility-administered medications for this visit.   Allergies: Allergies  Allergen Reactions   Bee Pollen Swelling, Anxiety and Itching   Metronidazole Other (See Comments) and Palpitations    Nausea and vomiting  Nausea and vomiting    Pollen Extract Anxiety and Itching   Baclofen Other (See Comments)    Headaches became worse   I reviewed her past medical history, social history, family history, and environmental history and no significant changes have been reported from her previous visit.  Review of Systems  Constitutional:  Negative for appetite change, chills, fever and unexpected weight change.  HENT:  Negative for congestion and rhinorrhea.   Eyes:  Negative for itching.  Respiratory:  Negative for cough, chest tightness, shortness of breath and wheezing.   Cardiovascular:  Negative for chest pain.  Gastrointestinal:  Negative for abdominal pain.  Genitourinary:  Negative for difficulty urinating.  Skin:  Positive for rash.  Neurological:  Positive for dizziness and light-headedness. Negative for headaches.    Objective: There were no vitals taken for this visit. There is no height or weight on file to calculate BMI. Physical Exam Vitals and nursing note reviewed.  Constitutional:      Appearance: Normal appearance. She is well-developed.  HENT:     Head: Normocephalic and atraumatic.     Right Ear: Tympanic membrane and external ear normal.     Left Ear: Tympanic  membrane and external ear normal.     Nose: Nose normal.     Mouth/Throat:     Mouth: Mucous membranes are moist.     Pharynx: Oropharynx is clear.  Eyes:     Conjunctiva/sclera: Conjunctivae normal.  Cardiovascular:     Rate and Rhythm: Normal rate and regular rhythm.     Heart sounds: Normal heart sounds. No murmur heard.    No friction rub. No gallop.  Pulmonary:     Adams: Pulmonary Adams is normal.     Breath sounds: Normal breath sounds. No wheezing, rhonchi or rales.  Musculoskeletal:     Cervical back: Neck supple.  Skin:    General: Skin is warm.     Findings: No rash.  Neurological:     Mental Status: She is alert and oriented to person, place, and time.  Psychiatric:        Behavior: Behavior normal.    Previous notes and tests were reviewed. The plan was reviewed with the patient/family, and all questions/concerned were addressed.  It was my pleasure to see Lakya today and participate in her care. Please feel free to contact me with any questions or concerns.  Sincerely,  Wyline Mood, DO Allergy & Immunology  [facility] office: [phone] [facility] office: [phone]

## 2023-09-19 ENCOUNTER — Ambulatory Visit (INDEPENDENT_AMBULATORY_CARE_PROVIDER_SITE_OTHER): Admitting: Allergy

## 2023-09-19 ENCOUNTER — Other Ambulatory Visit: Payer: Self-pay

## 2023-09-19 ENCOUNTER — Encounter: Payer: Self-pay | Admitting: Allergy

## 2023-09-19 VITALS — BP 110/54 | HR 84 | Temp 97.9°F | Resp 16

## 2023-09-19 DIAGNOSIS — L2389 Allergic contact dermatitis due to other agents: Secondary | ICD-10-CM | POA: Diagnosis not present

## 2023-09-19 DIAGNOSIS — J3089 Other allergic rhinitis: Secondary | ICD-10-CM

## 2023-09-19 MED ORDER — TACROLIMUS 0.1 % EX OINT: TOPICAL_OINTMENT | Freq: Two times a day (BID) | CUTANEOUS | 1 refills | Status: AC | PRN

## 2023-09-19 NOTE — Patient Instructions (Addendum)
 Skin Concerning for contact dermatitis.  Use over the counter antihistamines such as Zyrtec (cetirizine), Claritin (loratadine), Allegra (fexofenadine), or Xyzal (levocetirizine) daily as needed. May take twice a day if needed for itching.   Keep track of rashes and take pictures. Write down what you had done/eaten during flares.  Continue proper skin care.  Avoid spicy foods, acidic foods and crunchy foods as they irritate the skin when you have a flare.  Use protopic (tacrolimus) 0.1%ointment twice a day as needed for rash flares.  Pharmacy said it was ready and approved.   TRUE Patches placed today. Please avoid strenuous physical activities and do not get the patches on the back wet. No showering until final patch reading done. Okay to take antihistamines for itching but avoid placing any creams on the back where the patches are. We will remove the patches on Wednesday and will do our initial read. Then you will come back on Friday for final read.   True Test looks for the following sensitivities:       Environmental allergies Monitor symptoms.  Use over the counter antihistamines such as Zyrtec (cetirizine), Claritin (loratadine), Allegra (fexofenadine), or Xyzal (levocetirizine) daily as needed. May take twice a day during allergy flares. May switch antihistamines every few months. Consider allergy testing in the future.  Follow up for patch reading.   Skin care recommendations  Bath time: Always use lukewarm water. AVOID very hot or cold water. Keep bathing time to 5-10 minutes. Do NOT use bubble bath. Use a mild soap and use just enough to wash the dirty areas. Do NOT scrub skin vigorously.  After bathing, pat dry your skin with a towel. Do NOT rub or scrub the skin.  Moisturizers and prescriptions:  ALWAYS apply moisturizers immediately after bathing (within 3 minutes). This helps to lock-in moisture. Use the moisturizer several times a day over the whole body. Good  summer moisturizers include: Aveeno, CeraVe, Cetaphil. Good winter moisturizers include: Aquaphor, Vaseline, Cerave, Cetaphil, Eucerin.  When using moisturizers along with medications, the moisturizer should be applied about one hour after applying the medication to prevent diluting effect of the medication or moisturize around where you applied the medications. When not using medications, the moisturizer can be continued twice daily as maintenance.  Laundry and clothing: Avoid laundry products with added color or perfumes. Use unscented hypo-allergenic laundry products such as Tide free, Cheer free & gentle, and All free and clear.  If the skin still seems dry or sensitive, you can try double-rinsing the clothes. Avoid tight or scratchy clothing such as wool. Do not use fabric softeners or dyer sheets.

## 2023-09-20 ENCOUNTER — Telehealth: Payer: Self-pay

## 2023-09-20 NOTE — Progress Notes (Unsigned)
 Follow Up Note  RE: MERRELL RETTINGER MRN: [id number] DOB: 04/19/1986 Date of Office Visit: 09/21/2023  Referring provider: Porfirio Oar, PA Primary care provider: Porfirio Oar, PA  History of Present Illness: I had the pleasure of seeing Courtney Adams for a follow up visit at the [facility] on 09/21/2023. She is a 38 y.o. female, who is being followed for dermatitis. Today she is here for initial patch test interpretation, given suspected history of contact dermatitis.   Diagnostics:  TRUE TEST 48 hour reading:   T.R.U.E. Test - 09/21/23 1000       Test Information   Time Antigen Placed 1045    Manufacturer Other    Lot # [id number]    Location Back    Number of Test 36    Reading Interval Day 3;Day 5    Panel Panel 1;Panel 2;Panel 3      Panel 1   1. Nickel Sulfate 0    2. Wool Alcohols 0    3. Neomycin Sulfate 0    4. Potassium Dichromate 0    5. Caine Mix 0    6. Fragrance Mix 0    7. Colophony 0    8. Paraben Mix 0    9. Negative Control 0    10. Balsam of [place] 0    11. Ethylenediamine Dihydrochloride 0    12. Cobalt Dichloride 0      Panel 2   13. p-tert Butylphenol Formaldehyde Resin 0    14. Epoxy Resin 0    15. Carba Mix --   +/-   16.  Black Rubber Mix 0    17. Cl+ Me-Isothiazolinone 0    18. Quaternium-15 0    19. Methyldibromo Glutaronitrile 0    20. p-Phenylenediamine 2    21. Formaldehyde 0    22. Mercapto Mix 0    23. Thimerosal 0    24. Thiuram Mix 0      Panel 3   25. Diazolidinyl Urea 0    26. Quinoline Mix 0    27. Tixocortol-21-Pivalate 0    28. Gold Sodium Thiosulfate --   +/-   29. Imidazolidinyl Urea 0    30. Budesonide 0    31. Hydrocortisone-17-Butyrate 0    32. Mercaptobenzothiazole 0    33. Bacitracin 0    34. Parthenolide 0    35. Disperse Blue 106 0    36. 2-Bromo-2-Nitropropane-1,3-diol 0              Assessment and Plan: [name] is a 38 y.o. female with: There are no diagnoses linked to this  encounter. TRUE Patches removed and 48 hour reading was positive to P-phenyledeiamine; Borderline to carba mix and gold. The patient has been provided detailed information regarding the substances she is sensitive to, as well as products containing the substances.  Meticulous avoidance of these substances is recommended.  Use hydrocortisone 2.5% cream twice a day as needed for mild rash flares - okay to use on the face, neck, groin area. Do not use more than 1 week at a time. Issues with picking up tacrolimus.  Return in about 2 days (around 09/23/2023) for Patch reading.  It was my pleasure to see Courtney Adams today and participate in her care. Please feel free to contact me with any questions or concerns.  Sincerely,  Wyline Mood, DO Allergy & Immunology  [facility] office: [phone] [facility] office: [phone]  [place] office: [phone]

## 2023-09-20 NOTE — Telephone Encounter (Signed)
 Patient called due to not being able to pick up Protopic ointment yesterday. I called the pharmacy and they informed me the patient picked up a refill on 09/15/23 from a different pharmacy and has to wait 30 days until next refill is covered by insurance. I called the patient and left a message for her to call the [place] office back.

## 2023-09-20 NOTE — Telephone Encounter (Signed)
 Noted.

## 2023-09-21 ENCOUNTER — Ambulatory Visit (INDEPENDENT_AMBULATORY_CARE_PROVIDER_SITE_OTHER): Admitting: Allergy

## 2023-09-21 ENCOUNTER — Other Ambulatory Visit: Payer: Self-pay

## 2023-09-21 ENCOUNTER — Encounter: Payer: Self-pay | Admitting: Allergy

## 2023-09-21 DIAGNOSIS — L2389 Allergic contact dermatitis due to other agents: Secondary | ICD-10-CM | POA: Diagnosis not present

## 2023-09-21 MED ORDER — HYDROCORTISONE 2.5 % EX CREA: TOPICAL_CREAM | Freq: Two times a day (BID) | CUTANEOUS | 2 refills | Status: AC | PRN

## 2023-09-21 NOTE — Patient Instructions (Signed)
 Patch testing positive to: P-phenyledeiamine Borderline to carba mix and gold  Handouts given.  Use hydrocortisone 2.5% cream twice a day as needed for mild rash flares - okay to use on the face, neck, groin area. Do not use more than 1 week at a time.

## 2023-09-23 ENCOUNTER — Ambulatory Visit (INDEPENDENT_AMBULATORY_CARE_PROVIDER_SITE_OTHER): Admitting: Allergy

## 2023-09-23 ENCOUNTER — Encounter: Payer: Self-pay | Admitting: Allergy

## 2023-09-23 DIAGNOSIS — L2389 Allergic contact dermatitis due to other agents: Secondary | ICD-10-CM

## 2023-09-23 NOTE — Progress Notes (Signed)
    Follow-up Note  RE: Courtney Adams MRN: [id number] DOB: Aug 15, 1985 Date of Office Visit: 09/23/2023  Primary care provider: Porfirio Oar, PA Referring provider: Porfirio Oar, PA   [name] returns to the office today for the final patch test interpretation, given suspected history of contact dermatitis.    Diagnostics:  TRUE TEST 96 hour reading:   T.R.U.E. Test     Row Name 09/23/23 1000           Time Antigen Placed 1045       Manufacturer Other       Lot # [id number]       Location Back       Number of Test 36       Reading Interval Day 3;Day 5       Panel Panel 1;Panel 2;Panel 3       1. Nickel Sulfate 1       2. Wool Alcohols 0       3. Neomycin Sulfate 0       4. Potassium Dichromate 0       5. Caine Mix 0       6. Fragrance Mix 0       7. Colophony 0       8. Paraben Mix 0       9. Negative Control 0       10. Balsam of [place] 0       11. Ethylenediamine Dihydrochloride 0       12. Cobalt Dichloride 0       13. p-tert Butylphenol Formaldehyde Resin 0       14. Epoxy Resin 0       15. Carba Mix 0       16.  Black Rubber Mix 0       17. Cl+ Me-Isothiazolinone 0       18. Quaternium-15 0       19. Methyldibromo Glutaronitrile 0       20. p-Phenylenediamine 3       21. Formaldehyde 0       22. Mercapto Mix 0       23. Thimerosal 0       24. Thiuram Mix 0       25. Diazolidinyl Urea 0       26. Quinoline Mix 0       27. Tixocortol-21-Pivalate 0       28. Gold Sodium Thiosulfate 0       29. Imidazolidinyl Urea 0       30. Budesonide 0       31. Hydrocortisone-17-Butyrate 0       32. Mercaptobenzothiazole 0       33. Bacitracin 0       34. Parthenolide 0       35. Disperse Blue 106 0       36. 2-Bromo-2-Nitropropane-1,3-diol 0                 Plan:  Allergic contact dermatitis The patient has been provided detailed information regarding the substances she is sensitive to, as well as products containing the substances.  Meticulous avoidance of  these substances is recommended. If avoidance is not possible, the use of barrier creams or lotions is recommended. Will provide with product safe list from contact dermatitis society emailed to [id number] .[email or web address]  Margo Aye, MD [facility]

## 2023-10-06 DIAGNOSIS — N898 Other specified noninflammatory disorders of vagina: Secondary | ICD-10-CM | POA: Diagnosis not present

## 2023-10-25 DIAGNOSIS — E611 Iron deficiency: Secondary | ICD-10-CM | POA: Diagnosis not present

## 2023-10-25 DIAGNOSIS — L309 Dermatitis, unspecified: Secondary | ICD-10-CM | POA: Diagnosis not present

## 2023-10-27 ENCOUNTER — Ambulatory Visit (INDEPENDENT_AMBULATORY_CARE_PROVIDER_SITE_OTHER): Admitting: Allergy

## 2023-10-27 ENCOUNTER — Other Ambulatory Visit: Payer: Self-pay

## 2023-10-27 ENCOUNTER — Encounter: Payer: Self-pay | Admitting: Allergy

## 2023-10-27 VITALS — BP 102/62 | HR 90 | Temp 98.0°F | Ht 67.32 in | Wt 113.0 lb

## 2023-10-27 DIAGNOSIS — L2389 Allergic contact dermatitis due to other agents: Secondary | ICD-10-CM | POA: Diagnosis not present

## 2023-10-27 DIAGNOSIS — J3089 Other allergic rhinitis: Secondary | ICD-10-CM | POA: Diagnosis not present

## 2023-10-27 NOTE — Progress Notes (Signed)
 Follow-up Note  RE: Courtney Adams MRN: [id number] DOB: 04/18/1986 Date of Office Visit: 10/27/2023   History of present illness: Courtney Adams is a 38 y.o. female presenting today for follow-up of dermatitis.  She was last seen in the office on 09/23/2023 for her final patch test reading. Discussed the use of AI scribe software for clinical note transcription with the patient, who gave verbal consent to proceed.  Since November, she has experienced a persistent rash that began on her lips and has spread to her neck and face, accompanied by significant itching and discomfort. Her lips turn purple, and her face and neck itches, causing distress.  She has tried multiple treatments including triamcinolone, Protopic , and hydrocortisone , but none have provided lasting relief. Triamcinolone temporarily alleviates the itching, but other creams cause burning sensations. Non-medicated options like Vaseline do not provide adequate moisture.  Patch testing revealed allergies to gold, nickel, PPD and Carbomix. She has removed gold jewelry and switched to silver, which has not caused any issues. Despite these changes, the rash persists. She is concerned about potential food and environmental triggers, particularly nickel in foods.  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 She has also noticed that eating ranch and hot sauce may exacerbate her symptoms, causing her lips to turn gray.  She stopped smoking last year, and her son noticed the change in her lip color, questioning if she had resumed smoking which she has not. She has tried changing toothpaste and avoiding certain foods, but the rash continues to affect her quality of life.   She has not been to see a dermatologist yet for this issue.    Review of systems: 10pt ROS negative unless noted above in HPI  Past medical/social/surgical/family history have been reviewed and are unchanged unless  specifically indicated below.  No changes  Medication List: Current Outpatient Medications  Medication Sig Dispense Refill   Ferrous Sulfate  Dried (SLOW RELEASE IRON ) 45 MG TBCR Take by mouth.     hydrocortisone  2.5 % cream Apply topically 2 (two) times daily as needed (rash.). okay to use on the face, neck, groin area. Do not use more than 1 week at a time. 30 g 2   tacrolimus  (PROTOPIC ) 0.1 % ointment Apply topically 2 (two) times daily as needed. (Patient not taking: Reported on 10/27/2023) 60 g 1   No current facility-administered medications for this visit.     Known medication allergies: Allergies  Allergen Reactions   Bee Pollen Swelling, Anxiety and Itching   Metronidazole  Other (See Comments) and Palpitations    Nausea and vomiting  Nausea and vomiting    Pollen Extract Anxiety and Itching   Baclofen Other (See Comments)    Headaches became worse     Physical examination: Blood pressure 102/62, pulse 90, temperature 98 F (36.7 C), temperature source Temporal, height 5' 7.32" (1.71 m), weight 113 lb (51.3 kg), SpO2 99%, unknown if currently breastfeeding.  General: Alert, interactive, in no acute distress. HEENT: PERRLA, TMs pearly gray, turbinates non-edematous without discharge, post-pharynx non erythematous. Neck: Supple without lymphadenopathy. Lungs: Clear to auscultation without wheezing, rhonchi or rales. {no increased work of breathing. CV: Normal S1, S2 without murmurs. Abdomen: Nondistended, nontender. Skin: Neck on both sides near the collarbone with erythematous papules, lips with some hyperpigmentation to the lower lip and some hypopigmentation to the vermilion border of the upper lip. Extremities:  No clubbing, cyanosis or edema. Neuro:  Grossly intact.  Diagnositics/Labs: None today  Assessment and plan: Dermatitis Contact dermatitis confirmed with positive patch testing -continue to avoid products containing nickel, Carbomix, PPD, gold sodium  thiosulfate.  Product safety was emailed to [id number] .[email or web address] Continue triamcinolone as needed use Avoid Protopic  as this was not tolerated Provided with samples of 2 additional nonsteroid options to try separately to see if they have any positive - Opzelura (twice a day as needed application) and Vtama (once a day application as needed).  Can use over the counter antihistamines such as Zyrtec (cetirizine), Claritin  (loratadine ), Allegra (fexofenadine), or Xyzal (levocetirizine) daily as needed. May take twice a day if needed for itching.  Scheduled for skin testing for selected food and environmental allergy testing.  Hold all antihistamines for 3 days prior to this testing. Will place referral for dermatology for further evaluation and treatment recommendations Keep track of rashes and take pictures. Write down what you had done/eaten during flares.  Continue proper skin care. Avoid spicy foods, acidic foods and crunchy foods as they irritate the skin when you have a flare.     Environmental allergies Monitor symptoms.  As above we will plan for skin testing to environmental allergens Use over the counter antihistamines such as Zyrtec (cetirizine), Claritin  (loratadine ), Allegra (fexofenadine), or Xyzal (levocetirizine) daily as needed. May take twice a day during allergy flares. May switch antihistamines every few months.  Follow-up for skin testing visit (environment 1-55, select foods see sheet in folder)  I appreciate the opportunity to take part in Courtney Adams's care. Please do not hesitate to contact me with questions.  Sincerely,   Catha Clink, MD Allergy/Immunology [facility] 

## 2023-10-27 NOTE — Patient Instructions (Addendum)
 Dermatitis Contact dermatitis confirmed with positive patch testing -continue to avoid products containing nickel, Carbomix, PPD, gold sodium thiosulfate.  Product safety was emailed to [id number] .com Continue triamcinolone as needed use Avoid Protopic  as this was not tolerated Provided with samples of 2 additional nonsteroid options to try separately to see if they have any positive - Opzelura (twice a day as needed application) and Vtama (once a day application as needed).  Can use over the counter antihistamines such as Zyrtec (cetirizine), Claritin  (loratadine ), Allegra (fexofenadine), or Xyzal (levocetirizine) daily as needed. May take twice a day if needed for itching.  Scheduled for skin testing for selected food and environmental allergy testing.  Hold all antihistamines for 3 days prior to this testing. Will place referral for dermatology for further evaluation and treatment recommendations Keep track of rashes and take pictures. Write down what you had done/eaten during flares.  Continue proper skin care. Avoid spicy foods, acidic foods and crunchy foods as they irritate the skin when you have a flare.     Environmental allergies Monitor symptoms.  As above we will plan for skin testing to environmental allergens Use over the counter antihistamines such as Zyrtec (cetirizine), Claritin  (loratadine ), Allegra (fexofenadine), or Xyzal (levocetirizine) daily as needed. May take twice a day during allergy flares. May switch antihistamines every few months.  Follow-up for skin testing visit

## 2023-10-31 ENCOUNTER — Ambulatory Visit: Admitting: Allergy

## 2023-11-02 ENCOUNTER — Encounter: Payer: Self-pay | Admitting: Allergy

## 2023-11-02 ENCOUNTER — Ambulatory Visit (INDEPENDENT_AMBULATORY_CARE_PROVIDER_SITE_OTHER): Admitting: Allergy

## 2023-11-02 DIAGNOSIS — L2089 Other atopic dermatitis: Secondary | ICD-10-CM

## 2023-11-02 DIAGNOSIS — J3089 Other allergic rhinitis: Secondary | ICD-10-CM | POA: Diagnosis not present

## 2023-11-02 MED ORDER — EPINEPHRINE 0.3 MG/0.3ML IJ SOAJ: 0.3000 mg | INTRAMUSCULAR | 1 refills | Status: DC | PRN

## 2023-11-02 NOTE — Progress Notes (Signed)
 Follow-up Note  RE: SANTOSHA JIVIDEN MRN: [id number] DOB: Nov 25, 1985 Date of Office Visit: 11/02/2023   History of present illness: Courtney Adams is a 38 y.o. female presenting today for skin testing visit.  She was last seen in the office on 10/05/23 for contact dermatitis, rhinitis.  She is in her usual state of health today without recent illness.  She has held antihistamines for at least 3 days for testing today.   Medication List: Current Outpatient Medications  Medication Sig Dispense Refill   Ferrous Sulfate  Dried (SLOW RELEASE IRON ) 45 MG TBCR Take by mouth.     hydrocortisone  2.5 % cream Apply topically 2 (two) times daily as needed (rash.). okay to use on the face, neck, groin area. Do not use more than 1 week at a time. 30 g 2   tacrolimus  (PROTOPIC ) 0.1 % ointment Apply topically 2 (two) times daily as needed. (Patient not taking: Reported on 10/27/2023) 60 g 1   No current facility-administered medications for this visit.     Known medication allergies: Allergies  Allergen Reactions   Bee Pollen Swelling, Anxiety and Itching   Metronidazole  Other (See Comments) and Palpitations    Nausea and vomiting  Nausea and vomiting    Pollen Extract Anxiety and Itching   Baclofen Other (See Comments)    Headaches became worse     Diagnositics/Labs:  Allergy testing:   Airborne Adult Perc - 11/02/23 0956     Time Antigen Placed 4098    Allergen Manufacturer Floyd Hutchinson    Location Back    Number of Test 55    Panel 1 Select    2. Control-Histamine 2+    3. Bahia 2+    4. French Southern Territ[place] Negative    5. Johnson Negative    6. [place]  Blue 3+    7. Meadow Fescue 2+    8. Perennial Rye 3+    9. Timothy 3+    10. Ragweed Mix 2+    11. Cocklebur Negative    12. Plantain,  English 2+    13. Baccharis 2+    14. Dog Fennel Negative    15. Russian Thistle Negative    16. Lamb's Quarters 2+    17. Sheep Sorrell Negative    18. Rough Pigweed 2+    19. Marsh Elder, Rough Negative     20. Mugwort, Common Negative    21. Box, Elder Negative    22. Cedar, red Negative    23. Sweet Gum Negative    24. Pecan Pollen 4+    25. Pine Mix Negative    26. Walnut, Black Pollen 2+    27. Red Mulberry 2+    28. Ash Mix 2+    29. Birch Mix Negative    30. Beech American Negative    31. Cottonwood, Guinea-Bissau Negative    32. Hickory, White 4+    33. Maple Mix Negative    34. Oak, Guinea-[place]iss[place] Mix Negative    35. Sycamore Eastern Negative    36. Alternaria Alternata Negative    37. Cladosporium Herbarum Negative    38. Aspergillus Mix Negative    39. Penicillium Mix Negative    40. Bipolaris Sorokiniana (Helminthosporium) Negative    41. Drechslera Spicifera (Curvularia) Negative    42. Mucor Plumbeus Negative    43. Fusarium Moniliforme Negative    44. Aureobasidium Pullulans (pullulara) Negative    45. Rhizopus Oryzae Negative    46. Botrytis Cinera Negative  47. Epicoccum Nigrum Negative    49. Dust Mite Mix Negative    50. Cat Hair 10,000 BAU/ml Negative    51.  Dog Epithelia Negative    52. Mixed Feathers Negative    53. Horse Epithelia Negative    54. Cockroach, German Negative    55. Tobacco Leaf Negative             Food Adult Perc - 11/02/23 0900     Time Antigen Placed 4098    Allergen Manufacturer Floyd Hutchinson    Location Back    Number of allergen test 29    5. Milk, Cow Negative    7. Egg White, Chicken 2+    8. Shellfish Mix Negative    11. Walnut Food Negative    12. Almond Negative    13. Hazelnut Negative    14. Pecan Food Negative    17. Coconut Negative    23. Shrimp Negative    24. Crab Negative    25. Lobster Negative    26. Oyster Negative    34. Chicken Meat Negative    35. Pork Negative    38. Tomato Negative    45. Green Pepper 2+    46. Mushrooms Negative    47. Onion Negative    48. Avocado Negative    57. Banana Negative    58. Apple Negative    59. Peach Negative    60. Strawberry 2+    62. Cherry 2+    65. Pineapple  Negative    66. Chocolate/Cacao Bean 2+    67. Cinnamon Negative    70. Garlic Negative    71. Pepper, Black Negative             Allergy testing results were read and interpreted by provider, documented by clinical staff.   Assessment and plan:   Dermatitis Contact dermatitis confirmed with positive patch testing.  Continue to avoid products containing nickel, Carbomix, PPD, gold sodium thiosulfate.  Product safety was emailed to [id number] .com Continue triamcinolone as needed use Avoid Protopic  as this was not tolerated Provided with samples of 2 additional nonsteroid options to try separately to see if they have any positive - Opzelura (twice a day as needed application) and Vtama (once a day application as needed).  Can use over the counter antihistamines such as Zyrtec (cetirizine), Claritin  (loratadine ), Allegra (fexofenadine), or Xyzal (levocetirizine) daily as needed. May take twice a day if needed for itching.  Dermatology referral placed for further evaluation and treatment recommendations Keep track of rashes and take pictures. Write down what you had done/eaten during flares.  Continue proper skin care. Avoid spicy foods, acidic foods and crunchy foods as they irritate the skin when you have a flare.     Allergic rhinitis Monitor symptoms.  Use over the counter antihistamines such as Zyrtec (cetirizine), Claritin  (loratadine ), Allegra (fexofenadine), or Xyzal (levocetirizine) daily as needed. May take twice a day during allergy flares. May switch antihistamines every few months.  - Environmental allergy testing today showed: grasses, ragweed, weeds, trees, outdoor molds, dust mites, cat, dog, and cockroach - Food allergy testing today showed: Egg, mushroom, strawberry, cherry, chocolate - Copy of test results provided.  - Avoidance measures provided.  - We have discussed the following in regards to foods:   Allergy: food allergy is when you have eaten a food,  developed an allergic reaction after eating the food and have IgE to the food (positive food testing either by skin testing or blood  testing).  Food allergy could lead to life threatening symptoms  Sensitivity: occurs when you have IgE to a food (positive food testing either by skin testing or blood testing) but is a food you eat without any issues.  This is not an allergy and we recommend keeping the food in the diet  Intolerance: this is when you have negative testing by either skin testing or blood testing thus not allergic but the food causes symptoms (like belly pain, bloating, diarrhea etc) with ingestion.  These foods should be avoided to prevent symptoms.   - Continue avoidance of above foods you do not like or has caused symptoms after eating.    With egg if you are noting symptoms after eating then remove from diet and avoid otherwise if no symptoms then you are sensitized and should keep in diet.  - Have access to self-injectable epinephrine (Epipen or AuviQ) 0.3mg  at all times - Follow emergency action plan in case of allergic reaction   - Consider allergy shots as a means of long-term control for environmental allergies. - Allergy shots "re-train" and "reset" the immune system to ignore environmental allergens and decrease the resulting immune response to those allergens (sneezing, itchy watery eyes, runny nose, nasal congestion, etc).    - Allergy shots improve symptoms in 75-85% of patients.  - We can discuss more at the next appointment if the medications are not working for you.    I appreciate the opportunity to take part in Angeleen's care. Please do not hesitate to contact me with questions.  Sincerely,   Catha Clink, MD Allergy/Immunology [facility]

## 2023-11-02 NOTE — Patient Instructions (Addendum)
 Dermatitis Contact dermatitis confirmed with positive patch testing.  Continue to avoid products containing nickel, Carbomix, PPD, gold sodium thiosulfate.  Product safety was emailed to [id number] .com Continue triamcinolone as needed use Avoid Protopic  as this was not tolerated Provided with samples of 2 additional nonsteroid options to try separately to see if they have any positive - Opzelura (twice a day as needed application) and Vtama (once a day application as needed).  Can use over the counter antihistamines such as Zyrtec (cetirizine), Claritin  (loratadine ), Allegra (fexofenadine), or Xyzal (levocetirizine) daily as needed. May take twice a day if needed for itching.  Dermatology referral placed for further evaluation and treatment recommendations Keep track of rashes and take pictures. Write down what you had done/eaten during flares.  Continue proper skin care. Avoid spicy foods, acidic foods and crunchy foods as they irritate the skin when you have a flare.     Environmental allergies Monitor symptoms.  Use over the counter antihistamines such as Zyrtec (cetirizine), Claritin  (loratadine ), Allegra (fexofenadine), or Xyzal (levocetirizine) daily as needed. May take twice a day during allergy flares. May switch antihistamines every few months.  - Environmental allergy testing today showed: grasses, ragweed, weeds, trees, outdoor molds, dust mites, cat, dog, and cockroach - Food allergy testing today showed: Egg, mushroom, strawberry, cherry, chocolate - Copy of test results provided.  - Avoidance measures provided.  - We have discussed the following in regards to foods:   Allergy: food allergy is when you have eaten a food, developed an allergic reaction after eating the food and have IgE to the food (positive food testing either by skin testing or blood testing).  Food allergy could lead to life threatening symptoms  Sensitivity: occurs when you have IgE to a food (positive  food testing either by skin testing or blood testing) but is a food you eat without any issues.  This is not an allergy and we recommend keeping the food in the diet  Intolerance: this is when you have negative testing by either skin testing or blood testing thus not allergic but the food causes symptoms (like belly pain, bloating, diarrhea etc) with ingestion.  These foods should be avoided to prevent symptoms.   - Continue avoidance of above foods you do not like or has caused symptoms after eating.    With egg if you are noting symptoms after eating then remove from diet and avoid otherwise if no symptoms then you are sensitized and should keep in diet.  - Have access to self-injectable epinephrine (Epipen or AuviQ) 0.3mg  at all times - Follow emergency action plan in case of allergic reaction   - Consider allergy shots as a means of long-term control for environmental allergies. - Allergy shots "re-train" and "reset" the immune system to ignore environmental allergens and decrease the resulting immune response to those allergens (sneezing, itchy watery eyes, runny nose, nasal congestion, etc).    - Allergy shots improve symptoms in 75-85% of patients.  - We can discuss more at the next appointment if the medications are not working for you.

## 2023-11-03 ENCOUNTER — Telehealth: Payer: Self-pay | Admitting: Allergy

## 2023-11-03 MED ORDER — EPINEPHRINE 0.3 MG/0.3ML IJ SOAJ: 0.3000 mg | INTRAMUSCULAR | 1 refills | Status: AC | PRN

## 2023-11-03 NOTE — Telephone Encounter (Signed)
 Spoke with patient--DOB verified--informed her that epipen was sent [facility] to [facility] [address]. Verbalized understanding.

## 2023-11-03 NOTE — Telephone Encounter (Signed)
 Patient called stating she is needing [facility] to [facility] on [address] instead of [facility].

## 2023-11-04 DIAGNOSIS — N898 Other specified noninflammatory disorders of vagina: Secondary | ICD-10-CM | POA: Diagnosis not present

## 2023-11-08 ENCOUNTER — Telehealth: Payer: Self-pay | Admitting: Allergy

## 2023-11-08 NOTE — Telephone Encounter (Signed)
 Courtney Adams called and stated that she tried to pick up her Epi-pen, and that the pharmacy told her it was not able to be filled yet, as they needed more information (or approval she wasn't sure of exactly what) from our office. She is asking that we reach out and see why it was not able to be filled.

## 2023-11-08 NOTE — Telephone Encounter (Signed)
 Courtney Adams has been referred to   [facility] [address] [place] [address] ([phone]  **I have faxed the referral and all corresponding notes to their office.  They will reach out to the patient to schedule.

## 2023-11-09 NOTE — Telephone Encounter (Signed)
 Spoke with the patient and let her know that a prior [employer]ried [employer] has been started in hopes of getting epipen  approved. Verbalized understanding.

## 2023-11-10 ENCOUNTER — Other Ambulatory Visit (HOSPITAL_COMMUNITY): Payer: Self-pay

## 2023-11-10 ENCOUNTER — Telehealth: Payer: Self-pay

## 2023-11-10 NOTE — Telephone Encounter (Signed)
*  Asthma/Allergy  Pharmacy Patient Advocate Encounter   Received notification from Pt Calls Messages that prior authorization for Epi-Pen is required/requested.   Insurance verification completed.   The patient is insured through [facility] .   Per test claim: The current 30 day co-pay is, $4.00.  No PA needed at this time. This test claim was processed through [facility]- copay amounts may vary at other pharmacies due to pharmacy/plan contracts, or as the patient moves through the different stages of their insurance plan.     *ins will only cover certain manufactures  Called pharmacy and left message to bill appropriate Musician

## 2023-11-10 NOTE — Telephone Encounter (Signed)
 PA not needed- ins will only pay for certain manufactures depending on the plan.  I called and left the patients pharmacy a message to bill the covered version.

## 2023-11-21 NOTE — Telephone Encounter (Signed)
 Daionna has been scheduled for 07/30/2024 with Dr. Custer Downs.

## 2023-12-21 DIAGNOSIS — N898 Other specified noninflammatory disorders of vagina: Secondary | ICD-10-CM | POA: Diagnosis not present

## 2024-03-21 ENCOUNTER — Encounter (HOSPITAL_BASED_OUTPATIENT_CLINIC_OR_DEPARTMENT_OTHER): Payer: Self-pay | Admitting: Emergency Medicine

## 2024-03-21 ENCOUNTER — Other Ambulatory Visit: Payer: Self-pay

## 2024-03-21 ENCOUNTER — Emergency Department (HOSPITAL_BASED_OUTPATIENT_CLINIC_OR_DEPARTMENT_OTHER)

## 2024-03-21 ENCOUNTER — Emergency Department (HOSPITAL_BASED_OUTPATIENT_CLINIC_OR_DEPARTMENT_OTHER)
Admission: EM | Admit: 2024-03-21 | Discharge: 2024-03-21 | Discharge status: Against Medical Advice | Attending: Emergency Medicine | Admitting: Emergency Medicine

## 2024-03-21 DIAGNOSIS — Y92410 Unspecified street and highway as the place of occurrence of the external cause: Secondary | ICD-10-CM | POA: Insufficient documentation

## 2024-03-21 DIAGNOSIS — M542 Cervicalgia: Secondary | ICD-10-CM | POA: Insufficient documentation

## 2024-03-21 DIAGNOSIS — Z5321 Procedure and treatment not carried out due to patient leaving prior to being seen by health care provider: Secondary | ICD-10-CM | POA: Insufficient documentation
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
## 2024-03-22 ENCOUNTER — Emergency Department (HOSPITAL_BASED_OUTPATIENT_CLINIC_OR_DEPARTMENT_OTHER)
Admission: EM | Admit: 2024-03-22 | Discharge: 2024-03-22 | Disposition: A | Discharge status: Home / Self Care | Attending: Emergency Medicine | Admitting: Emergency Medicine

## 2024-03-22 ENCOUNTER — Encounter (HOSPITAL_BASED_OUTPATIENT_CLINIC_OR_DEPARTMENT_OTHER): Payer: Self-pay

## 2024-03-22 DIAGNOSIS — Y92410 Unspecified street and highway as the place of occurrence of the external cause: Secondary | ICD-10-CM | POA: Insufficient documentation

## 2024-03-22 DIAGNOSIS — S134XXA Sprain of ligaments of cervical spine, initial encounter: Secondary | ICD-10-CM | POA: Insufficient documentation

## 2024-03-22 DIAGNOSIS — S199XXA Unspecified injury of neck, initial encounter: Secondary | ICD-10-CM | POA: Diagnosis present

## 2024-03-22 MED ORDER — CYCLOBENZAPRINE HCL 10 MG PO TABS: 10.0000 mg | ORAL_TABLET | Freq: Two times a day (BID) | ORAL | 0 refills | Status: AC | PRN
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
